# Patient Record
Sex: Male | Born: 1940 | Race: White | Hispanic: No | Marital: Married | Smoking: Former smoker
Health system: Southern US, Community
[De-identification: ages and names within clinical notes are randomized; demographics above are authoritative.]

## PROBLEM LIST (undated history)

## (undated) DIAGNOSIS — N429 Disorder of prostate, unspecified: Secondary | ICD-10-CM

## (undated) DIAGNOSIS — F32A Depression, unspecified: Secondary | ICD-10-CM

## (undated) DIAGNOSIS — N200 Calculus of kidney: Secondary | ICD-10-CM

## (undated) DIAGNOSIS — R609 Edema, unspecified: Secondary | ICD-10-CM

## (undated) DIAGNOSIS — M1990 Unspecified osteoarthritis, unspecified site: Secondary | ICD-10-CM

## (undated) DIAGNOSIS — G4730 Sleep apnea, unspecified: Secondary | ICD-10-CM

## (undated) DIAGNOSIS — C801 Malignant (primary) neoplasm, unspecified: Secondary | ICD-10-CM

## (undated) DIAGNOSIS — K219 Gastro-esophageal reflux disease without esophagitis: Secondary | ICD-10-CM

## (undated) DIAGNOSIS — R410 Disorientation, unspecified: Secondary | ICD-10-CM

## (undated) DIAGNOSIS — I341 Nonrheumatic mitral (valve) prolapse: Secondary | ICD-10-CM

## (undated) DIAGNOSIS — I10 Essential (primary) hypertension: Secondary | ICD-10-CM

## (undated) DIAGNOSIS — I499 Cardiac arrhythmia, unspecified: Secondary | ICD-10-CM

## (undated) DIAGNOSIS — H9190 Unspecified hearing loss, unspecified ear: Secondary | ICD-10-CM

## (undated) DIAGNOSIS — F329 Major depressive disorder, single episode, unspecified: Secondary | ICD-10-CM

## (undated) HISTORY — PX: CYSTOSCOPY: SHX5120

## (undated) HISTORY — PX: CEREBRAL ANEURYSM REPAIR: SHX164

## (undated) HISTORY — PX: BLADDER SURGERY: SHX569

## (undated) HISTORY — PX: WRIST SURGERY: SHX841

## (undated) HISTORY — PX: EYE SURGERY: SHX253

## (undated) HISTORY — PX: BACK SURGERY: SHX140

## (undated) HISTORY — PX: TONSILLECTOMY: SUR1361

## (undated) HISTORY — PX: KNEE ARTHROSCOPY: SUR90

---

## 1999-02-01 HISTORY — PX: CEREBRAL ANEURYSM REPAIR: SHX164

## 1999-02-01 HISTORY — PX: VENTRICULOPERITONEAL SHUNT: SHX204

## 1999-09-28 ENCOUNTER — Encounter: Payer: Self-pay | Admitting: Neurosurgery

## 1999-09-28 ENCOUNTER — Encounter: Payer: Self-pay | Admitting: Pulmonary Disease

## 1999-09-28 ENCOUNTER — Inpatient Hospital Stay (HOSPITAL_COMMUNITY): Admission: EM | Admit: 1999-09-28 | Discharge: 1999-10-25 | Payer: Self-pay | Admitting: Emergency Medicine

## 1999-09-30 ENCOUNTER — Encounter: Payer: Self-pay | Admitting: Neurosurgery

## 1999-10-02 ENCOUNTER — Encounter: Payer: Self-pay | Admitting: Neurosurgery

## 1999-10-03 ENCOUNTER — Encounter: Payer: Self-pay | Admitting: Neurosurgery

## 1999-10-03 ENCOUNTER — Encounter: Payer: Self-pay | Admitting: Pulmonary Disease

## 1999-10-04 ENCOUNTER — Encounter: Payer: Self-pay | Admitting: Pulmonary Disease

## 1999-10-05 ENCOUNTER — Encounter: Payer: Self-pay | Admitting: Neurosurgery

## 1999-10-07 ENCOUNTER — Encounter: Payer: Self-pay | Admitting: Neurosurgery

## 1999-10-07 ENCOUNTER — Encounter: Payer: Self-pay | Admitting: Pulmonary Disease

## 1999-10-08 ENCOUNTER — Encounter: Payer: Self-pay | Admitting: Neurosurgery

## 1999-10-12 ENCOUNTER — Encounter: Payer: Self-pay | Admitting: Neurosurgery

## 1999-10-12 ENCOUNTER — Encounter: Payer: Self-pay | Admitting: Critical Care Medicine

## 1999-10-15 ENCOUNTER — Encounter: Payer: Self-pay | Admitting: Neurosurgery

## 1999-10-18 ENCOUNTER — Encounter: Payer: Self-pay | Admitting: Neurosurgery

## 1999-10-20 ENCOUNTER — Encounter: Payer: Self-pay | Admitting: Neurosurgery

## 1999-10-25 ENCOUNTER — Inpatient Hospital Stay (HOSPITAL_COMMUNITY)
Admission: RE | Admit: 1999-10-25 | Discharge: 1999-10-29 | Payer: Self-pay | Admitting: Physical Medicine & Rehabilitation

## 1999-11-12 ENCOUNTER — Ambulatory Visit (HOSPITAL_COMMUNITY): Admission: RE | Admit: 1999-11-12 | Discharge: 1999-11-12 | Payer: Self-pay | Admitting: Neurosurgery

## 1999-11-12 ENCOUNTER — Encounter: Payer: Self-pay | Admitting: Neurosurgery

## 2000-09-07 ENCOUNTER — Encounter: Payer: Self-pay | Admitting: Neurosurgery

## 2000-09-07 ENCOUNTER — Ambulatory Visit (HOSPITAL_COMMUNITY): Admission: RE | Admit: 2000-09-07 | Discharge: 2000-09-07 | Payer: Self-pay | Admitting: Neurosurgery

## 2001-11-09 ENCOUNTER — Ambulatory Visit (HOSPITAL_COMMUNITY): Admission: RE | Admit: 2001-11-09 | Discharge: 2001-11-09 | Payer: Self-pay | Admitting: Interventional Radiology

## 2003-11-07 ENCOUNTER — Ambulatory Visit: Payer: Self-pay | Admitting: Gastroenterology

## 2004-11-15 ENCOUNTER — Ambulatory Visit: Payer: Self-pay | Admitting: Internal Medicine

## 2004-12-08 ENCOUNTER — Ambulatory Visit: Payer: Self-pay | Admitting: Urology

## 2005-01-27 ENCOUNTER — Ambulatory Visit: Payer: Self-pay

## 2005-03-30 ENCOUNTER — Ambulatory Visit: Payer: Self-pay | Admitting: Urology

## 2005-10-18 ENCOUNTER — Encounter: Payer: Self-pay | Admitting: Internal Medicine

## 2005-10-26 ENCOUNTER — Ambulatory Visit: Payer: Self-pay | Admitting: Urology

## 2006-04-26 ENCOUNTER — Ambulatory Visit: Payer: Self-pay | Admitting: Urology

## 2006-11-19 ENCOUNTER — Other Ambulatory Visit: Payer: Self-pay

## 2006-11-19 ENCOUNTER — Emergency Department: Payer: Self-pay | Admitting: Emergency Medicine

## 2006-11-20 ENCOUNTER — Ambulatory Visit: Payer: Self-pay | Admitting: Urology

## 2006-12-12 ENCOUNTER — Ambulatory Visit: Payer: Self-pay | Admitting: Urology

## 2006-12-16 ENCOUNTER — Ambulatory Visit: Payer: Self-pay | Admitting: Gastroenterology

## 2007-01-17 ENCOUNTER — Ambulatory Visit: Payer: Self-pay | Admitting: Urology

## 2007-03-14 ENCOUNTER — Ambulatory Visit: Payer: Self-pay | Admitting: Urology

## 2007-03-21 ENCOUNTER — Ambulatory Visit: Payer: Self-pay | Admitting: Unknown Physician Specialty

## 2007-03-21 ENCOUNTER — Other Ambulatory Visit: Payer: Self-pay

## 2007-04-03 ENCOUNTER — Ambulatory Visit: Payer: Self-pay | Admitting: Urology

## 2007-04-09 ENCOUNTER — Ambulatory Visit: Payer: Self-pay | Admitting: Unknown Physician Specialty

## 2007-08-31 ENCOUNTER — Ambulatory Visit: Payer: Self-pay | Admitting: Urology

## 2007-09-04 ENCOUNTER — Ambulatory Visit: Payer: Self-pay | Admitting: Urology

## 2008-04-14 ENCOUNTER — Ambulatory Visit: Payer: Self-pay | Admitting: Unknown Physician Specialty

## 2008-06-23 ENCOUNTER — Ambulatory Visit: Payer: Self-pay | Admitting: Urology

## 2008-06-24 ENCOUNTER — Ambulatory Visit: Payer: Self-pay | Admitting: Urology

## 2008-08-28 ENCOUNTER — Ambulatory Visit: Payer: Self-pay | Admitting: Unknown Physician Specialty

## 2008-08-29 ENCOUNTER — Ambulatory Visit: Payer: Self-pay | Admitting: Unknown Physician Specialty

## 2008-12-10 ENCOUNTER — Ambulatory Visit: Payer: Self-pay | Admitting: Ophthalmology

## 2008-12-13 IMAGING — CR DG ABDOMEN 1V
1 series · 2 of 2 positions shown · non-contrast
Comparison: none

REASON FOR EXAM: nephrolithiasis, pt need films
COMMENTS:

[Series 1: view not recorded · 0.17mm/px · 2 of 2 slices shown]
[im 1/2]
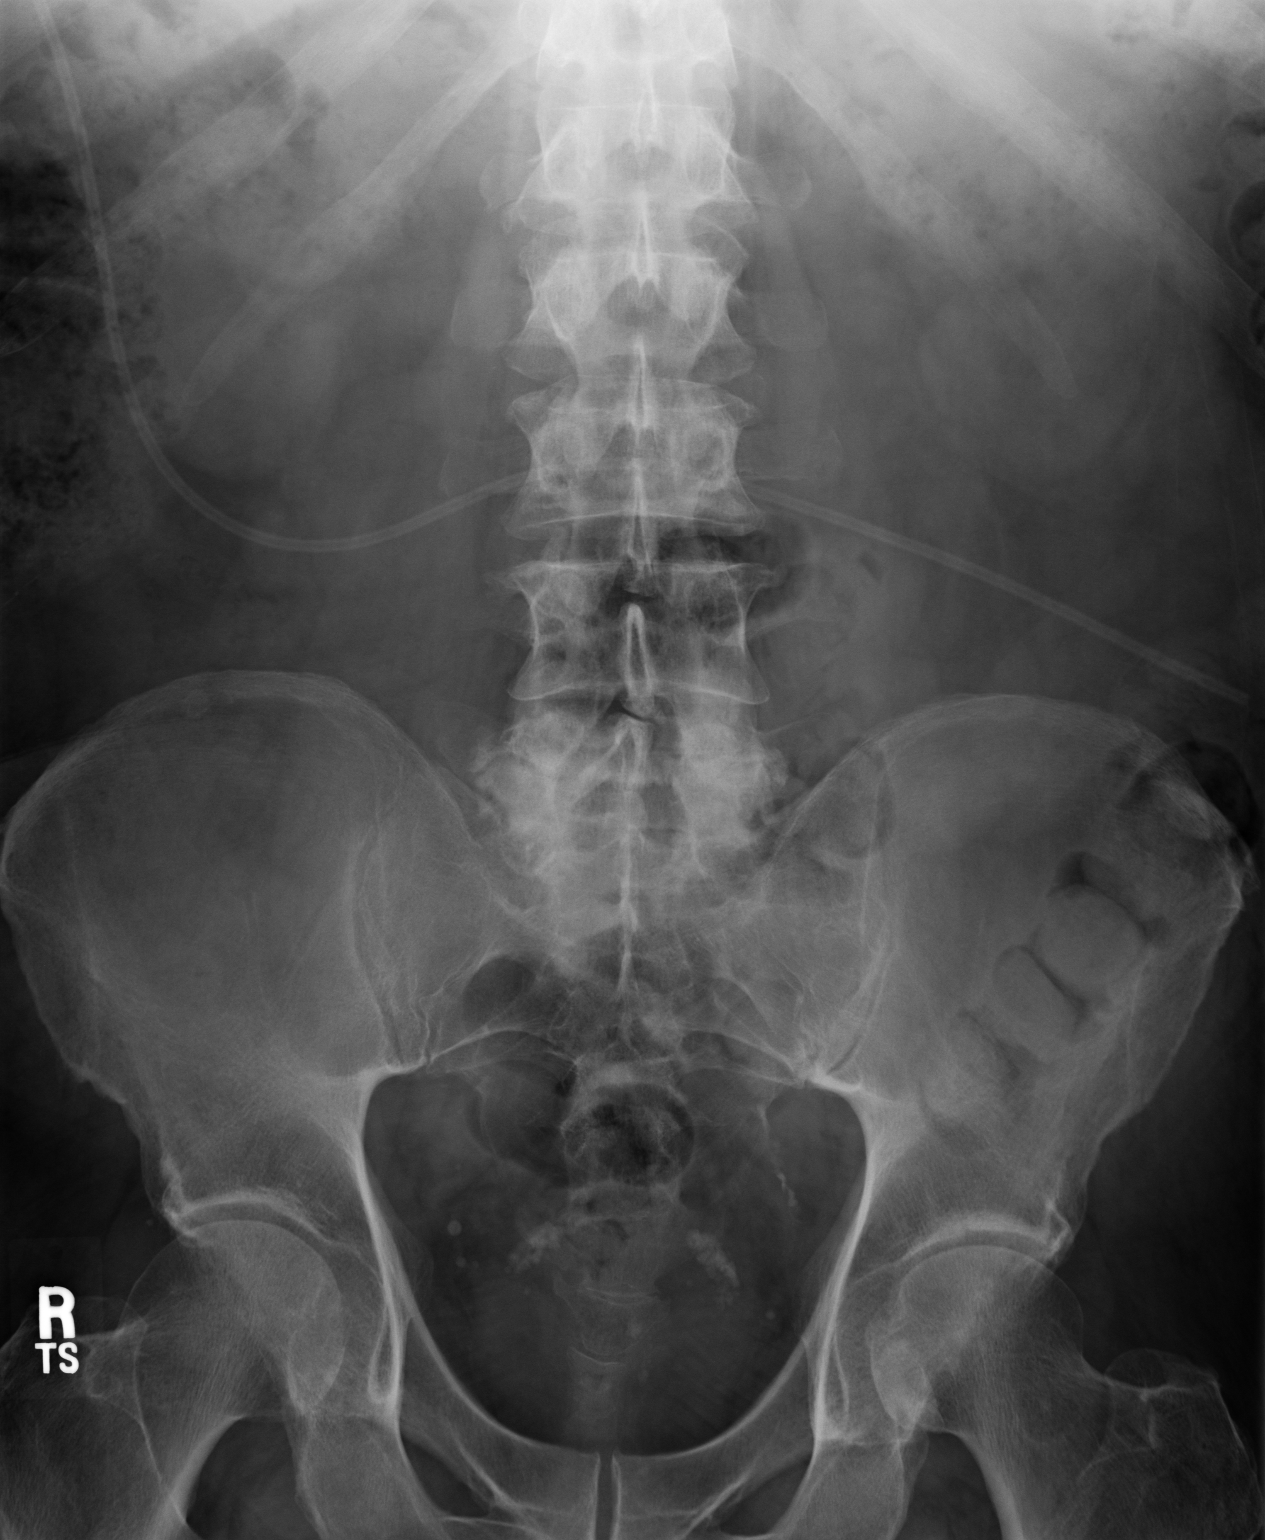
[im 2/2]
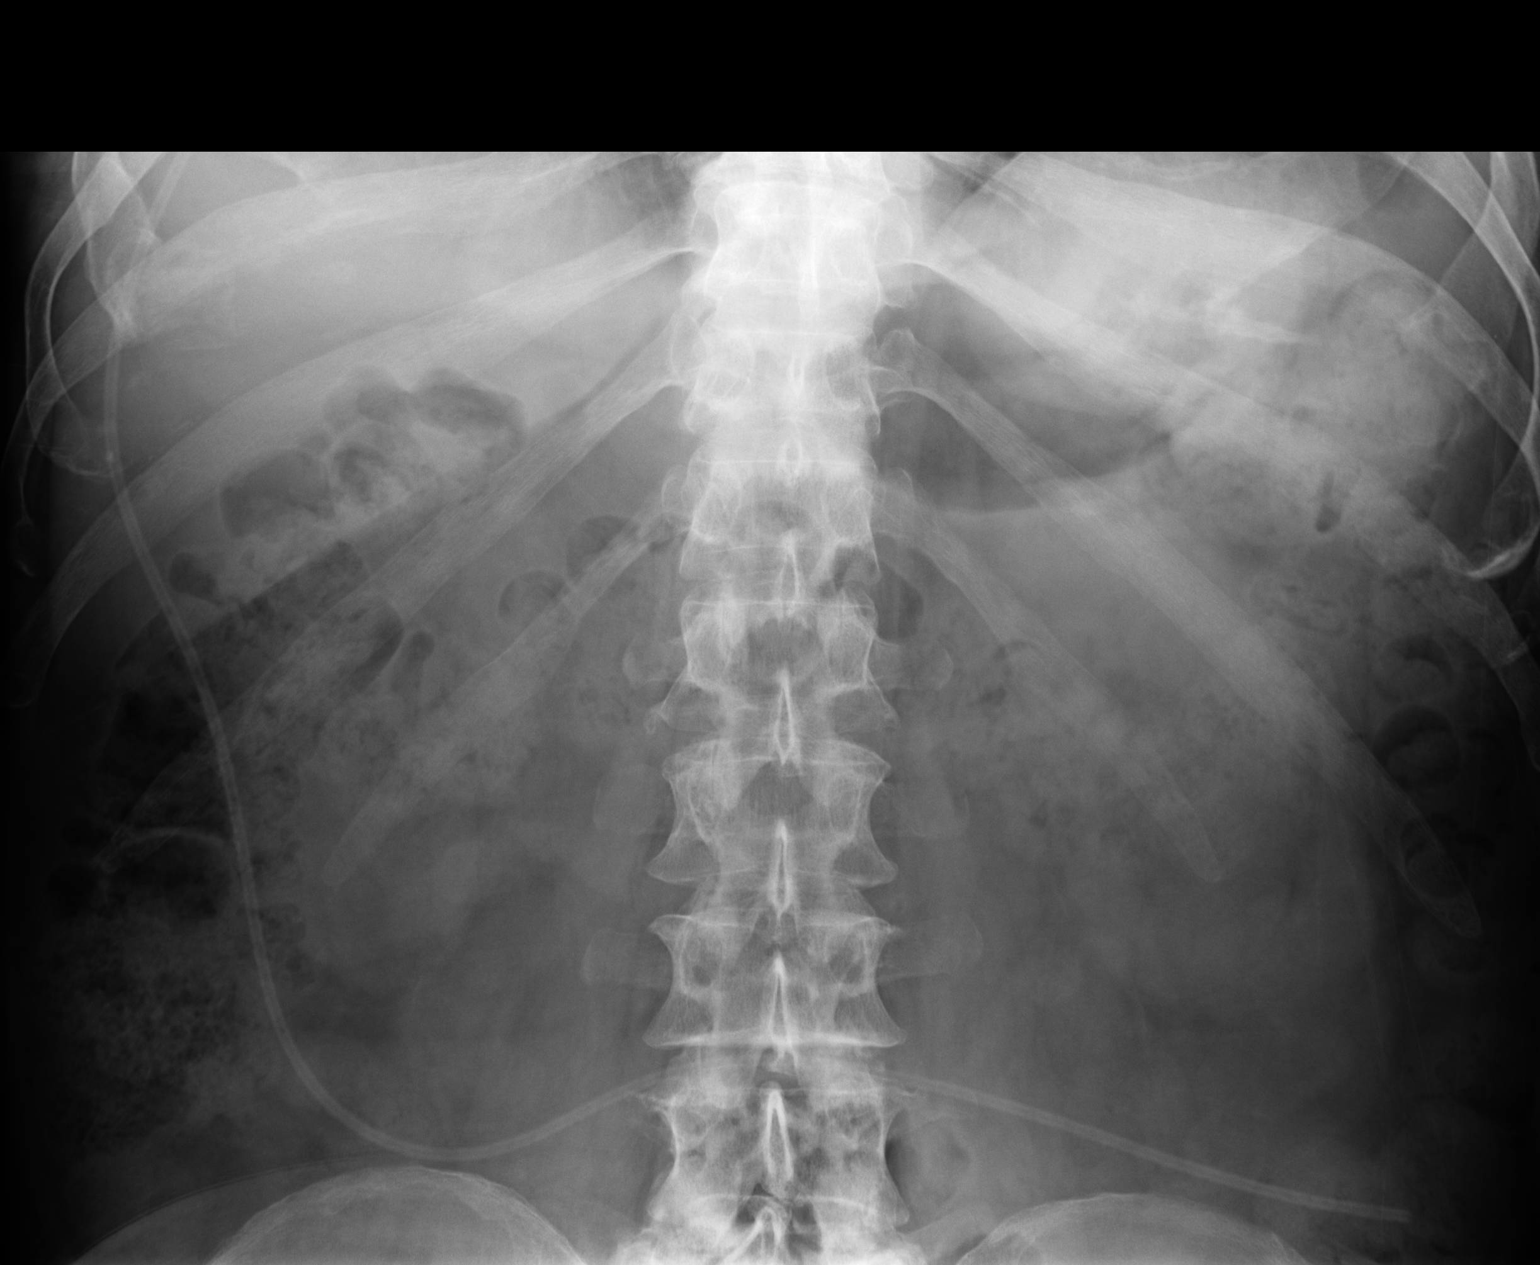

[2 of 2 positions shown; findings below may reference images not displayed]

PROCEDURE:     DXR - DXR KIDNEY URETER BLADDER  - August 31, 2007  [DATE]

RESULT:     Comparison is made to the prior exam of 01/17/2007. No definite
renal calcifications are identified on plain film examination. There are
noted two calcifications projected at the LEFT pelvic area and which could
represent stones in the urinary bladder. The more superiorly located
measures 4 mm in diameter and is projected near the medial aspect of LEFT
ureterovesical junction and could either be in the distal ureter or bladder.
Alternatively, these could represent opaque material in the rectum and be
unrelated to the urinary tract.  A catheter is present over the abdomen and
presumably represents the distal portion of a ventriculoperitoneal shunt.
There is a moderate amount of fecal material in the colon.
IMPRESSION: Please see above.

## 2008-12-22 ENCOUNTER — Ambulatory Visit: Payer: Self-pay | Admitting: Ophthalmology

## 2009-01-08 ENCOUNTER — Ambulatory Visit: Payer: Self-pay | Admitting: Urology

## 2009-01-15 ENCOUNTER — Ambulatory Visit: Payer: Self-pay | Admitting: Urology

## 2009-01-20 ENCOUNTER — Ambulatory Visit: Payer: Self-pay | Admitting: Urology

## 2009-08-26 ENCOUNTER — Ambulatory Visit: Payer: Self-pay | Admitting: Urology

## 2009-10-26 ENCOUNTER — Ambulatory Visit: Payer: Self-pay | Admitting: Internal Medicine

## 2009-12-04 ENCOUNTER — Ambulatory Visit: Payer: Self-pay | Admitting: Unknown Physician Specialty

## 2009-12-08 LAB — PATHOLOGY REPORT

## 2009-12-22 ENCOUNTER — Ambulatory Visit: Payer: Self-pay | Admitting: Urology

## 2009-12-23 ENCOUNTER — Ambulatory Visit: Payer: Self-pay | Admitting: Cardiology

## 2010-01-05 ENCOUNTER — Ambulatory Visit: Payer: Self-pay | Admitting: Specialist

## 2010-01-13 ENCOUNTER — Inpatient Hospital Stay: Payer: Self-pay | Admitting: Specialist

## 2010-02-02 ENCOUNTER — Encounter: Payer: Self-pay | Admitting: Specialist

## 2010-02-20 ENCOUNTER — Encounter: Payer: Self-pay | Admitting: Interventional Radiology

## 2010-04-06 ENCOUNTER — Ambulatory Visit: Payer: Self-pay | Admitting: Urology

## 2010-04-20 ENCOUNTER — Ambulatory Visit: Payer: Self-pay | Admitting: Urology

## 2010-04-23 IMAGING — CR DG ABDOMEN 1V
1 series · 2 of 2 positions shown · non-contrast
Comparison: none

REASON FOR EXAM: nephrolithiasis pt need films
COMMENTS:

[Series 1: view not recorded · 0.17mm/px · 2 of 2 slices shown]
[im 1/2]
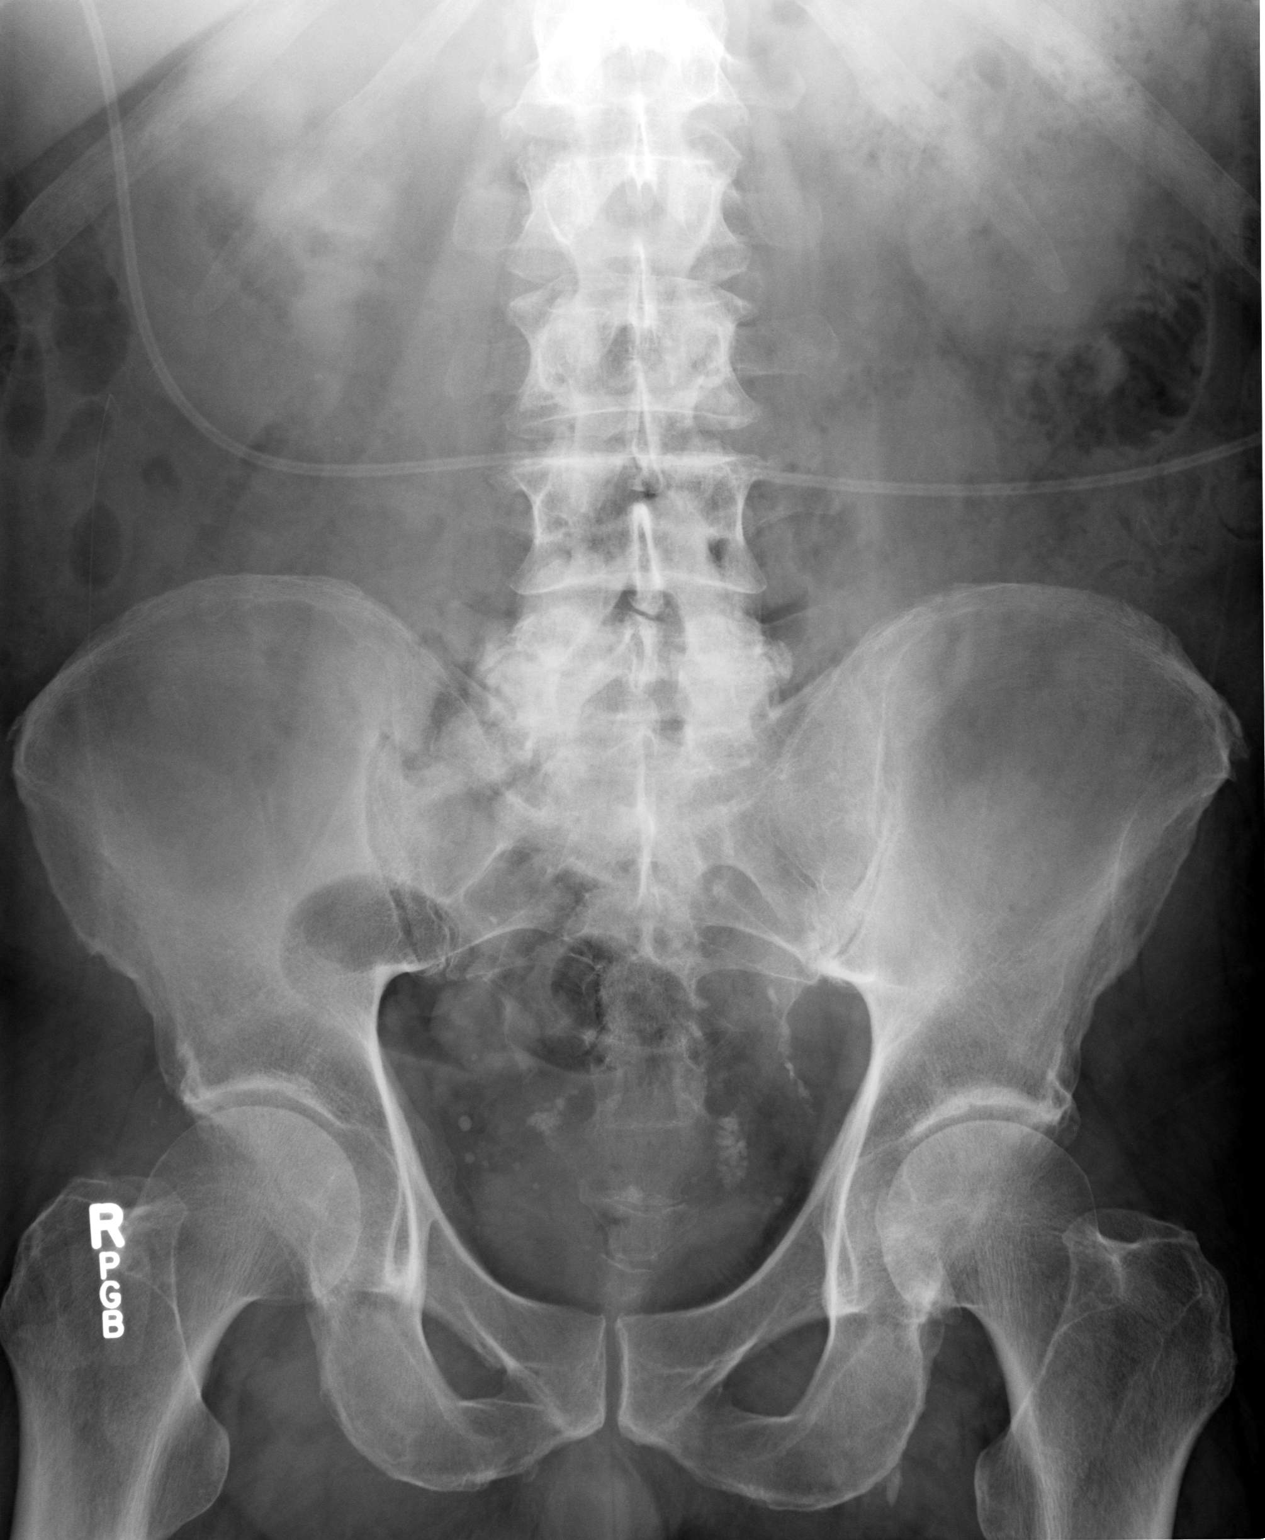
[im 2/2]
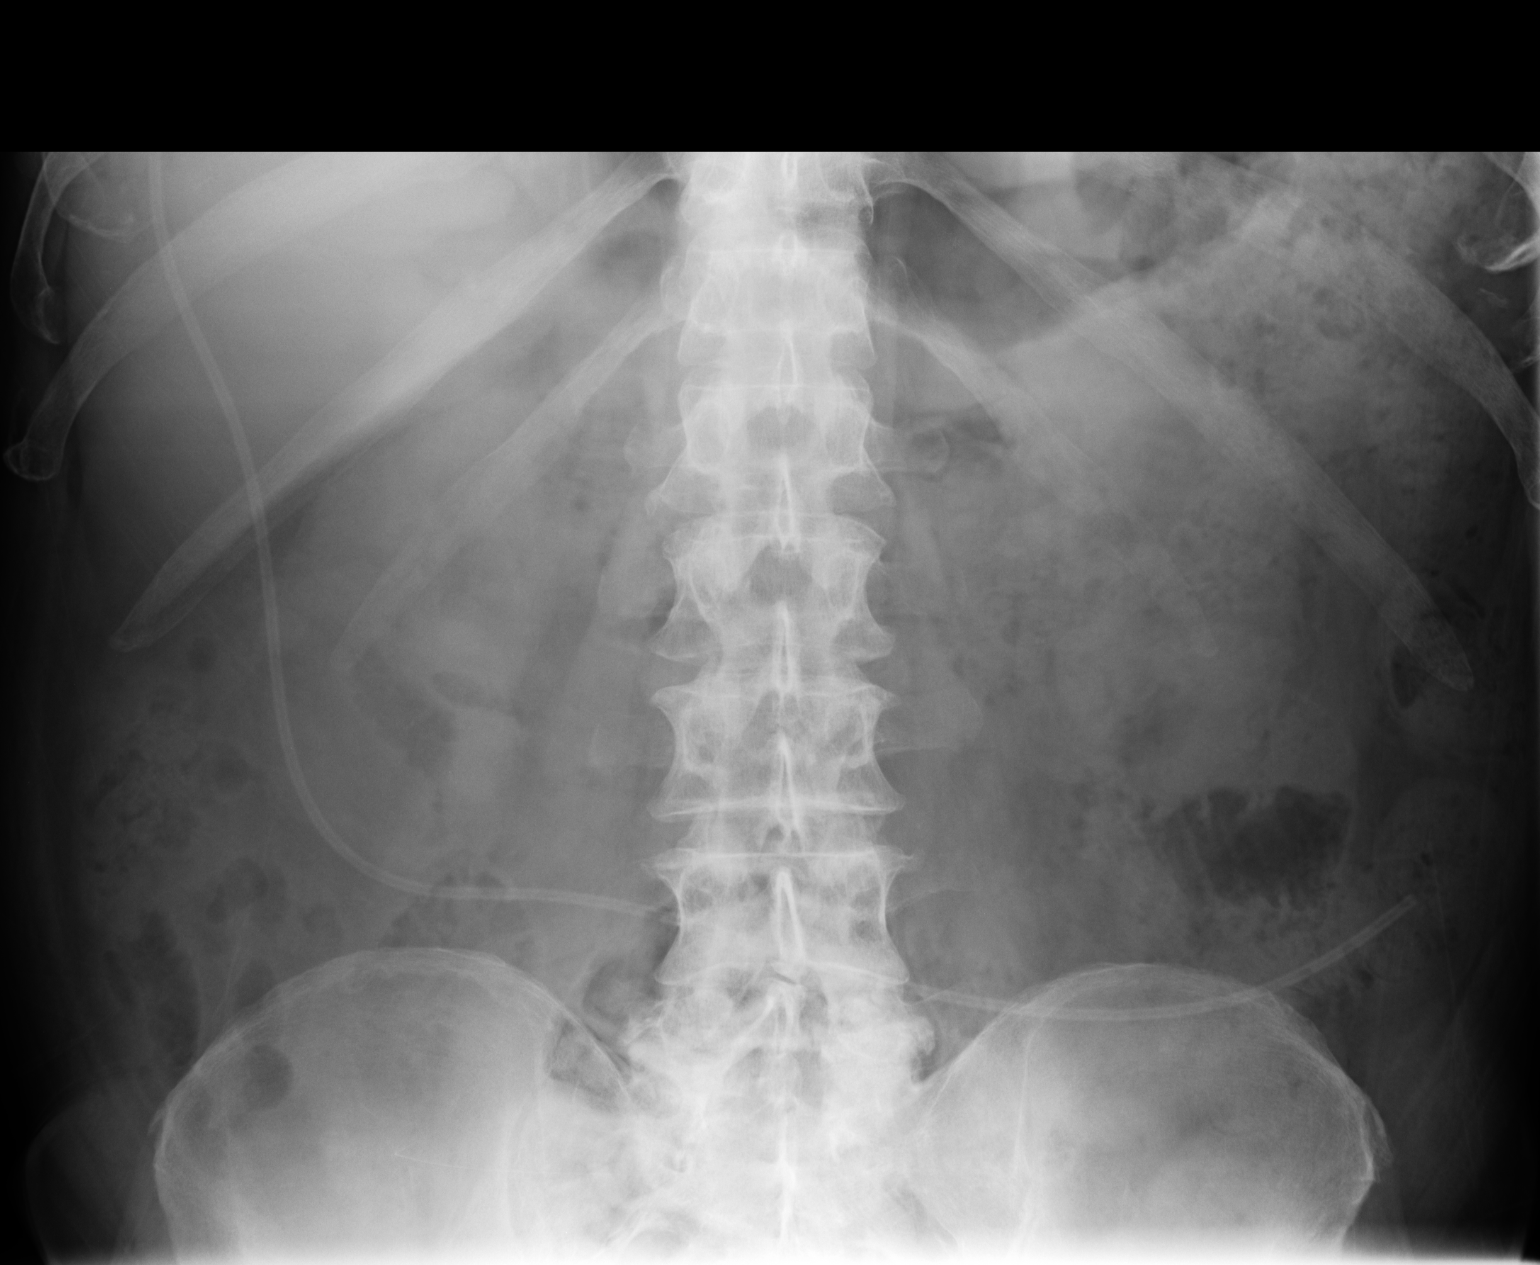

[2 of 2 positions shown; findings below may reference images not displayed]

PROCEDURE:     DXR - DXR KIDNEY URETER BLADDER  - January 08, 2009 [DATE]

RESULT:     Comparison is made to an examination of 06/23/2008 as well is to
a study of 08/31/2007. Multiple calcifications are seen over the pelvic
region including vascular calcifications. Degenerative changes are seen in
the lower lumbar spine. The bowel gas pattern is unremarkable. No definite
nephrolithiasis is evident. No abnormal bowel distention is seen. A tube
projects over the abdomen from the right upper quadrant extending inferiorly
and then transversely at the level of the iliac crest terminating in the
left midabdomen suggestive of a ventriculoperitoneal tube.
IMPRESSION: No definite nephrolithiasis. Ureteral stones would be difficult to exclude.
CT followup is available if indicated.

## 2010-05-04 ENCOUNTER — Other Ambulatory Visit: Payer: Self-pay | Admitting: Urology

## 2010-08-27 ENCOUNTER — Other Ambulatory Visit: Payer: Self-pay | Admitting: Urology

## 2010-08-28 LAB — PSA: PSA: 2.9 ng/mL (ref 0.0–4.0)

## 2010-12-09 IMAGING — CR DG ABDOMEN 1V
1 series · 2 of 2 positions shown · non-contrast
Comparison: none

REASON FOR EXAM: nephroithiasis pt need films
COMMENTS:

[Series 1: view not recorded · 0.17mm/px · 2 of 2 slices shown]
[im 1/2]
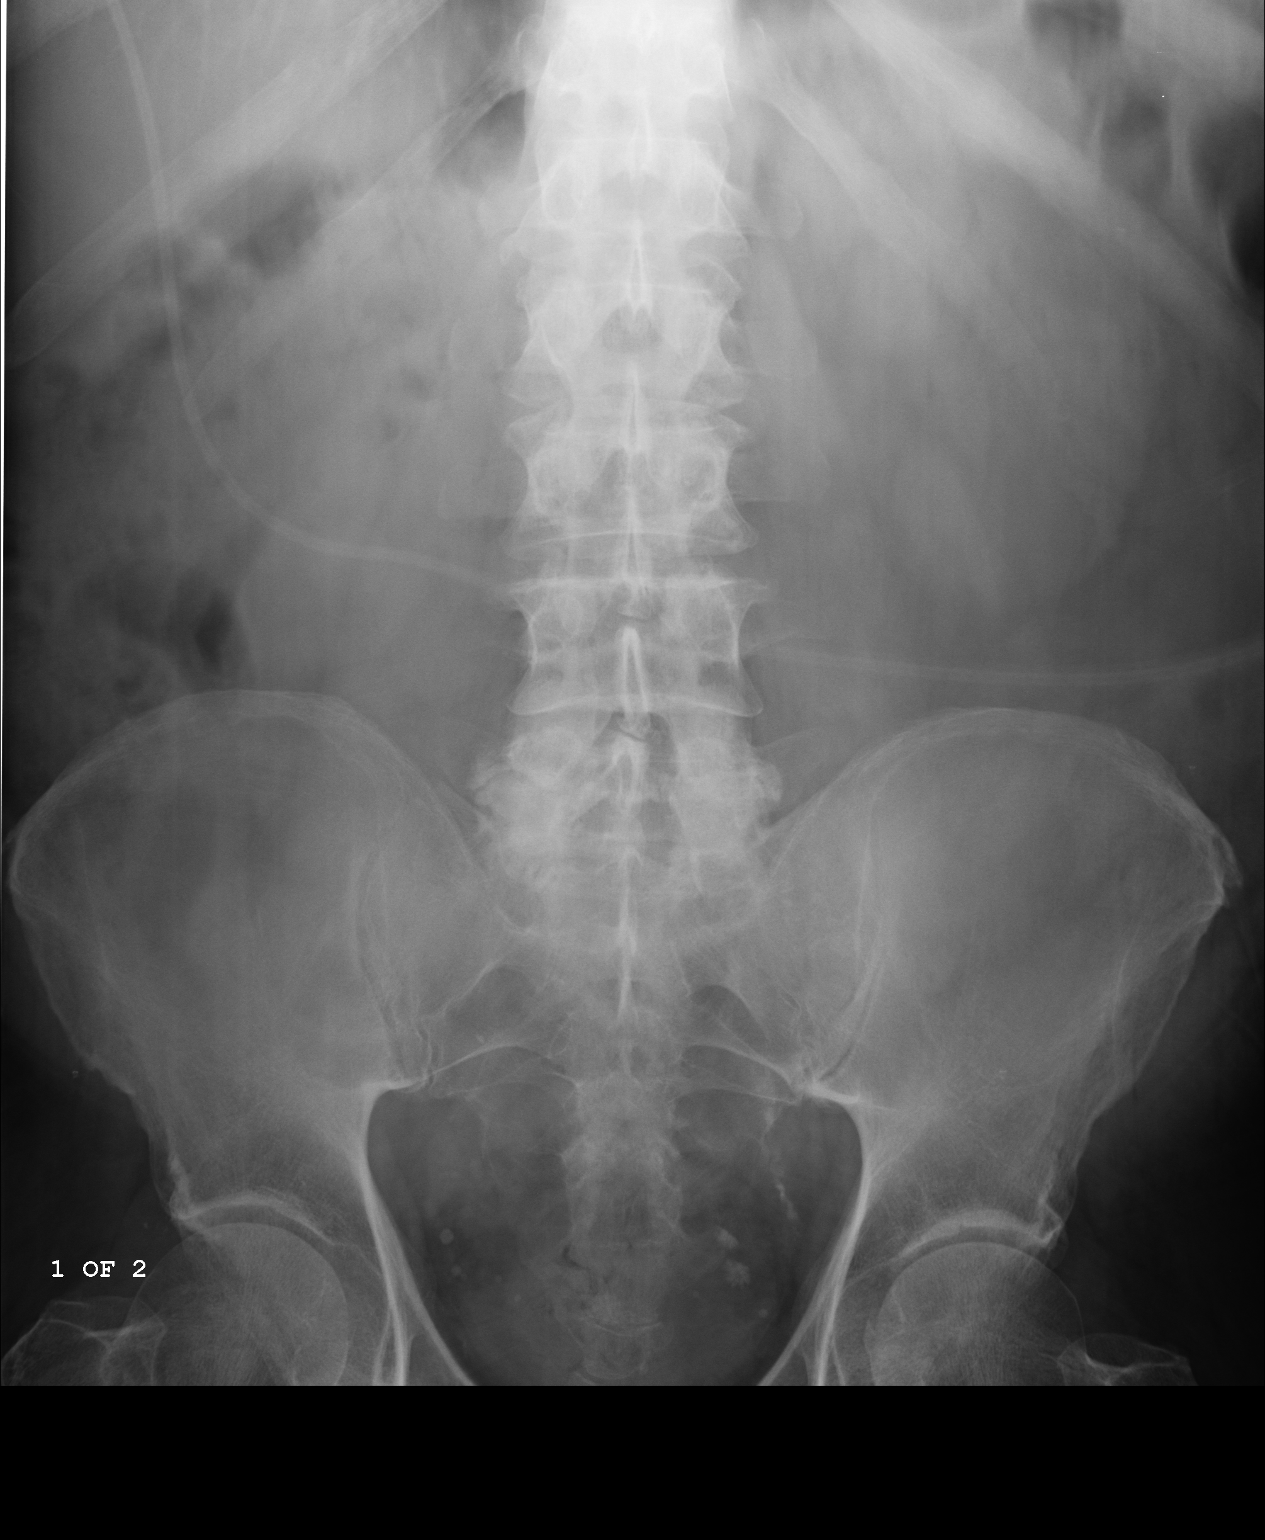
[im 2/2]
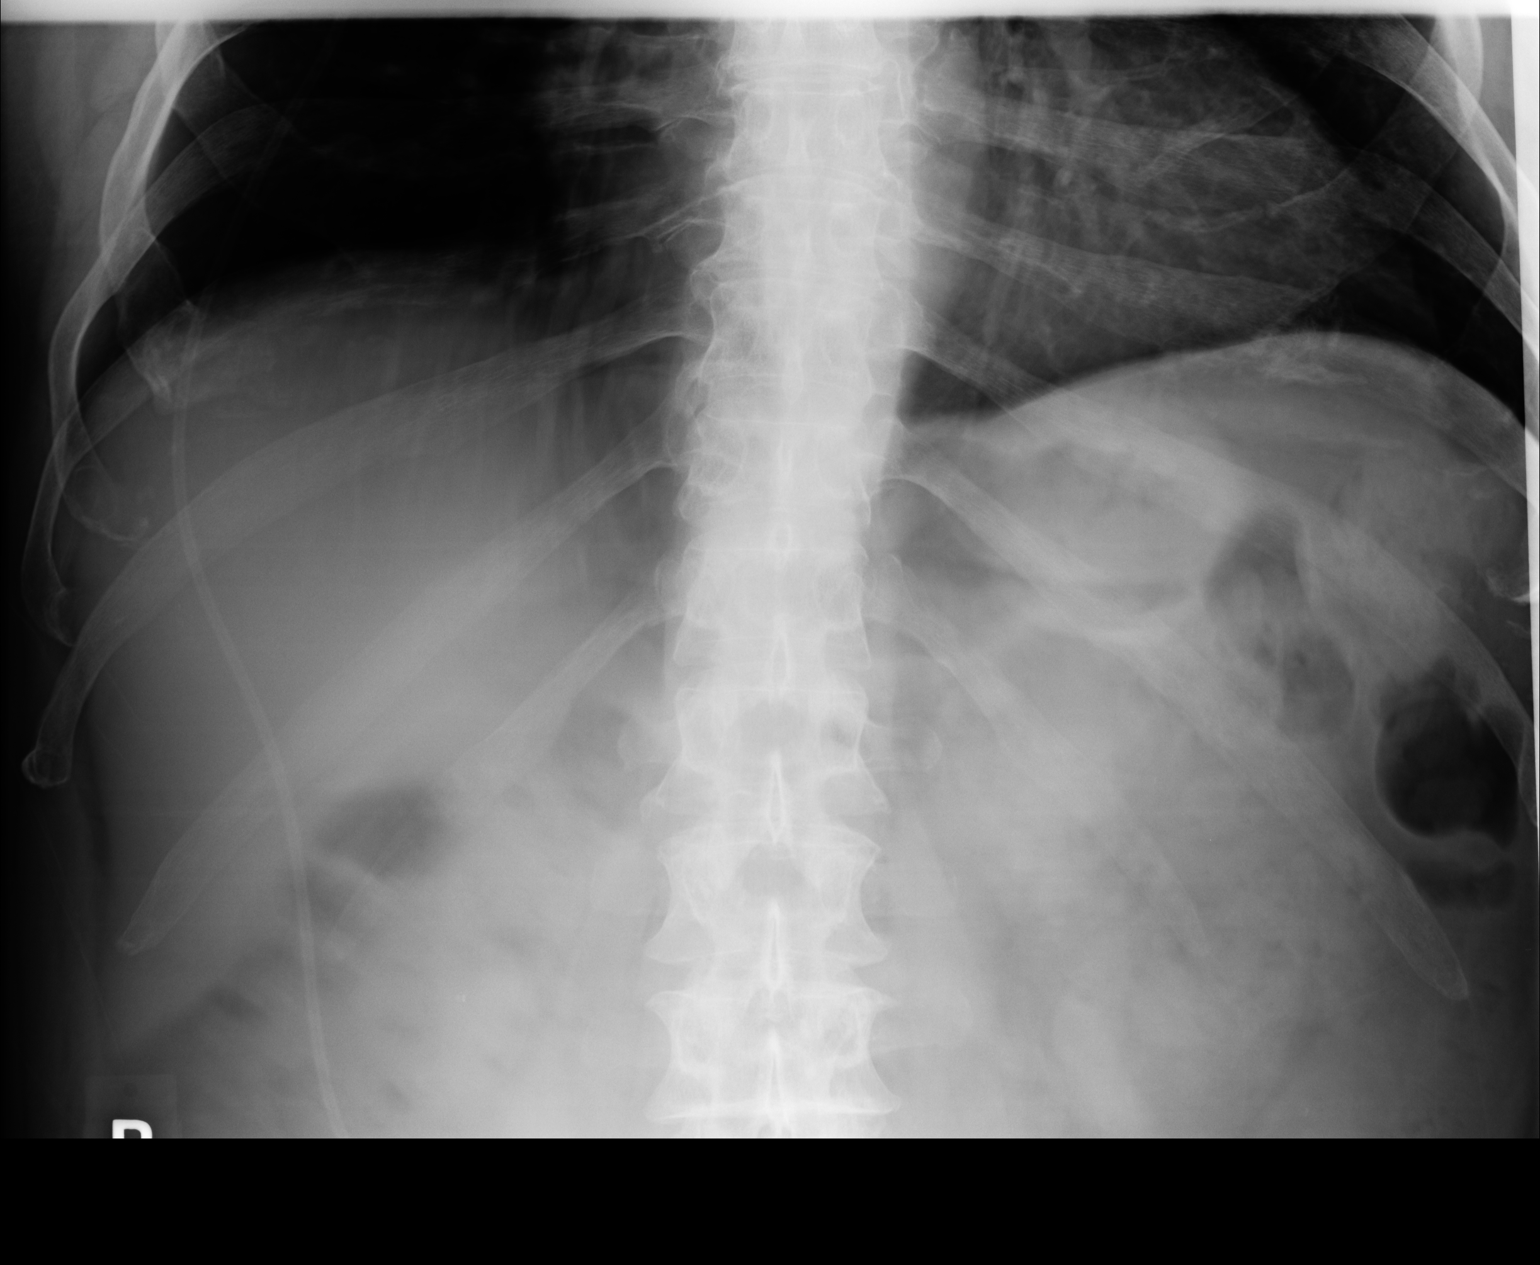

[2 of 2 positions shown; findings below may reference images not displayed]

PROCEDURE:     DXR - DXR KIDNEY URETER BLADDER  - August 26, 2009  [DATE]

RESULT:     Comparison is made to the study of 01/08/2009.

There is respiratory motion artifact obscuring portions of the upper
abdomen. There is a piece of tubing projecting over the right abdomen and
transversely across the mid abdomen with the tip demonstrated in the left
abdomen suggestive of a ventriculoperitoneal shunt tube. No definite renal
calculi are evident. Numerous pelvic calcifications suggest phleboliths and
atherosclerotic disease.
IMPRESSION: No nephrolithiasis or urinary tract stones evident. Degenerative bony
changes, atherosclerotic calcification and phleboliths are present. There
appears to be a ventriculoperitoneal shunt tube likely present.

## 2011-01-20 ENCOUNTER — Ambulatory Visit: Payer: Self-pay | Admitting: Cardiology

## 2011-03-30 ENCOUNTER — Ambulatory Visit: Payer: Self-pay | Admitting: Urology

## 2012-09-12 ENCOUNTER — Ambulatory Visit: Payer: Self-pay | Admitting: Urology

## 2013-03-15 ENCOUNTER — Ambulatory Visit: Payer: Self-pay | Admitting: Unknown Physician Specialty

## 2014-02-20 ENCOUNTER — Ambulatory Visit: Payer: Self-pay | Admitting: Internal Medicine

## 2014-04-28 ENCOUNTER — Ambulatory Visit: Payer: Self-pay | Admitting: Unknown Physician Specialty

## 2014-05-26 LAB — SURGICAL PATHOLOGY

## 2014-09-02 ENCOUNTER — Encounter: Payer: Self-pay | Admitting: *Deleted

## 2014-09-02 DIAGNOSIS — Z982 Presence of cerebrospinal fluid drainage device: Secondary | ICD-10-CM | POA: Diagnosis not present

## 2014-09-02 DIAGNOSIS — Z974 Presence of external hearing-aid: Secondary | ICD-10-CM | POA: Diagnosis not present

## 2014-09-02 DIAGNOSIS — I341 Nonrheumatic mitral (valve) prolapse: Secondary | ICD-10-CM | POA: Diagnosis not present

## 2014-09-02 DIAGNOSIS — Z8679 Personal history of other diseases of the circulatory system: Secondary | ICD-10-CM | POA: Diagnosis not present

## 2014-09-02 DIAGNOSIS — M1990 Unspecified osteoarthritis, unspecified site: Secondary | ICD-10-CM | POA: Diagnosis not present

## 2014-09-02 DIAGNOSIS — Z7982 Long term (current) use of aspirin: Secondary | ICD-10-CM | POA: Diagnosis not present

## 2014-09-02 DIAGNOSIS — H2511 Age-related nuclear cataract, right eye: Secondary | ICD-10-CM | POA: Diagnosis not present

## 2014-09-02 DIAGNOSIS — K219 Gastro-esophageal reflux disease without esophagitis: Secondary | ICD-10-CM | POA: Diagnosis not present

## 2014-09-02 DIAGNOSIS — Z87442 Personal history of urinary calculi: Secondary | ICD-10-CM | POA: Diagnosis not present

## 2014-09-02 DIAGNOSIS — Z87891 Personal history of nicotine dependence: Secondary | ICD-10-CM | POA: Diagnosis not present

## 2014-09-02 DIAGNOSIS — H269 Unspecified cataract: Secondary | ICD-10-CM | POA: Diagnosis present

## 2014-09-02 DIAGNOSIS — E780 Pure hypercholesterolemia: Secondary | ICD-10-CM | POA: Diagnosis not present

## 2014-09-02 DIAGNOSIS — F329 Major depressive disorder, single episode, unspecified: Secondary | ICD-10-CM | POA: Diagnosis not present

## 2014-09-02 DIAGNOSIS — I499 Cardiac arrhythmia, unspecified: Secondary | ICD-10-CM | POA: Diagnosis not present

## 2014-09-02 DIAGNOSIS — I10 Essential (primary) hypertension: Secondary | ICD-10-CM | POA: Diagnosis not present

## 2014-09-02 DIAGNOSIS — Z79899 Other long term (current) drug therapy: Secondary | ICD-10-CM | POA: Diagnosis not present

## 2014-09-02 DIAGNOSIS — Z8551 Personal history of malignant neoplasm of bladder: Secondary | ICD-10-CM | POA: Diagnosis not present

## 2014-09-02 DIAGNOSIS — G4730 Sleep apnea, unspecified: Secondary | ICD-10-CM | POA: Diagnosis not present

## 2014-09-02 DIAGNOSIS — H9190 Unspecified hearing loss, unspecified ear: Secondary | ICD-10-CM | POA: Diagnosis not present

## 2014-09-08 ENCOUNTER — Ambulatory Visit: Payer: PPO | Admitting: Anesthesiology

## 2014-09-08 ENCOUNTER — Encounter
Admission: RE | Disposition: A | Discharge status: Home / Self Care | Payer: Self-pay | Source: Ambulatory Visit | Attending: Ophthalmology

## 2014-09-08 ENCOUNTER — Encounter: Payer: Self-pay | Admitting: *Deleted

## 2014-09-08 ENCOUNTER — Ambulatory Visit
Admission: RE | Admit: 2014-09-08 | Discharge: 2014-09-08 | Disposition: A | Discharge status: Home / Self Care | Payer: PPO | Source: Ambulatory Visit | Attending: Ophthalmology | Admitting: Ophthalmology

## 2014-09-08 DIAGNOSIS — Z87442 Personal history of urinary calculi: Secondary | ICD-10-CM | POA: Insufficient documentation

## 2014-09-08 DIAGNOSIS — Z982 Presence of cerebrospinal fluid drainage device: Secondary | ICD-10-CM | POA: Insufficient documentation

## 2014-09-08 DIAGNOSIS — Z7982 Long term (current) use of aspirin: Secondary | ICD-10-CM | POA: Insufficient documentation

## 2014-09-08 DIAGNOSIS — F329 Major depressive disorder, single episode, unspecified: Secondary | ICD-10-CM | POA: Insufficient documentation

## 2014-09-08 DIAGNOSIS — Z8679 Personal history of other diseases of the circulatory system: Secondary | ICD-10-CM | POA: Insufficient documentation

## 2014-09-08 DIAGNOSIS — Z79899 Other long term (current) drug therapy: Secondary | ICD-10-CM | POA: Insufficient documentation

## 2014-09-08 DIAGNOSIS — Z8551 Personal history of malignant neoplasm of bladder: Secondary | ICD-10-CM | POA: Insufficient documentation

## 2014-09-08 DIAGNOSIS — Z87891 Personal history of nicotine dependence: Secondary | ICD-10-CM | POA: Insufficient documentation

## 2014-09-08 DIAGNOSIS — I341 Nonrheumatic mitral (valve) prolapse: Secondary | ICD-10-CM | POA: Insufficient documentation

## 2014-09-08 DIAGNOSIS — H2511 Age-related nuclear cataract, right eye: Secondary | ICD-10-CM | POA: Insufficient documentation

## 2014-09-08 DIAGNOSIS — H9190 Unspecified hearing loss, unspecified ear: Secondary | ICD-10-CM | POA: Insufficient documentation

## 2014-09-08 DIAGNOSIS — I10 Essential (primary) hypertension: Secondary | ICD-10-CM | POA: Insufficient documentation

## 2014-09-08 DIAGNOSIS — M1990 Unspecified osteoarthritis, unspecified site: Secondary | ICD-10-CM | POA: Insufficient documentation

## 2014-09-08 DIAGNOSIS — I499 Cardiac arrhythmia, unspecified: Secondary | ICD-10-CM | POA: Insufficient documentation

## 2014-09-08 DIAGNOSIS — Z974 Presence of external hearing-aid: Secondary | ICD-10-CM | POA: Insufficient documentation

## 2014-09-08 DIAGNOSIS — G4730 Sleep apnea, unspecified: Secondary | ICD-10-CM | POA: Insufficient documentation

## 2014-09-08 DIAGNOSIS — K219 Gastro-esophageal reflux disease without esophagitis: Secondary | ICD-10-CM | POA: Insufficient documentation

## 2014-09-08 DIAGNOSIS — E780 Pure hypercholesterolemia: Secondary | ICD-10-CM | POA: Insufficient documentation

## 2014-09-08 HISTORY — DX: Depression, unspecified: F32.A

## 2014-09-08 HISTORY — PX: CATARACT EXTRACTION W/PHACO: SHX586

## 2014-09-08 HISTORY — DX: Nonrheumatic mitral (valve) prolapse: I34.1

## 2014-09-08 HISTORY — DX: Essential (primary) hypertension: I10

## 2014-09-08 HISTORY — DX: Cardiac arrhythmia, unspecified: I49.9

## 2014-09-08 HISTORY — DX: Major depressive disorder, single episode, unspecified: F32.9

## 2014-09-08 HISTORY — DX: Edema, unspecified: R60.9

## 2014-09-08 HISTORY — DX: Unspecified hearing loss, unspecified ear: H91.90

## 2014-09-08 HISTORY — DX: Malignant (primary) neoplasm, unspecified: C80.1

## 2014-09-08 HISTORY — DX: Unspecified osteoarthritis, unspecified site: M19.90

## 2014-09-08 HISTORY — DX: Disorder of prostate, unspecified: N42.9

## 2014-09-08 HISTORY — DX: Disorientation, unspecified: R41.0

## 2014-09-08 HISTORY — DX: Gastro-esophageal reflux disease without esophagitis: K21.9

## 2014-09-08 HISTORY — DX: Sleep apnea, unspecified: G47.30

## 2014-09-08 HISTORY — DX: Calculus of kidney: N20.0

## 2014-09-08 SURGERY — PHACOEMULSIFICATION, CATARACT, WITH IOL INSERTION
Anesthesia: Monitor Anesthesia Care | Site: Eye | Laterality: Right | Wound class: Clean

## 2014-09-08 MED ORDER — PHENYLEPHRINE HCL 10 % OP SOLN
1.0000 [drp] | OPHTHALMIC | Status: AC | PRN
  Administered 2014-09-08 (×4): 1 [drp] via OPHTHALMIC

## 2014-09-08 MED ORDER — TETRACAINE HCL 0.5 % OP SOLN
OPHTHALMIC | Status: AC
  Filled 2014-09-08: qty 2

## 2014-09-08 MED ORDER — EPINEPHRINE HCL 1 MG/ML IJ SOLN
INTRAMUSCULAR | Status: DC | PRN
  Administered 2014-09-08: 200 mL via OPHTHALMIC

## 2014-09-08 MED ORDER — LIDOCAINE HCL (PF) 4 % IJ SOLN
INTRAMUSCULAR | Status: DC | PRN
  Administered 2014-09-08: .5 mL

## 2014-09-08 MED ORDER — CEFUROXIME OPHTHALMIC INJECTION 1 MG/0.1 ML
INJECTION | OPHTHALMIC | Status: AC
  Filled 2014-09-08: qty 0.1

## 2014-09-08 MED ORDER — EPINEPHRINE HCL 1 MG/ML IJ SOLN
INTRAMUSCULAR | Status: AC
  Filled 2014-09-08: qty 2

## 2014-09-08 MED ORDER — MOXIFLOXACIN HCL 0.5 % OP SOLN
1.0000 [drp] | OPHTHALMIC | Status: AC | PRN
  Administered 2014-09-08 (×3): 1 [drp] via OPHTHALMIC

## 2014-09-08 MED ORDER — BUPIVACAINE HCL (PF) 0.75 % IJ SOLN
INTRAMUSCULAR | Status: AC
  Filled 2014-09-08: qty 10

## 2014-09-08 MED ORDER — CYCLOPENTOLATE HCL 2 % OP SOLN
OPHTHALMIC | Status: AC
  Filled 2014-09-08: qty 2

## 2014-09-08 MED ORDER — LIDOCAINE HCL (PF) 4 % IJ SOLN
INTRAMUSCULAR | Status: AC
  Filled 2014-09-08: qty 5

## 2014-09-08 MED ORDER — CEFUROXIME OPHTHALMIC INJECTION 1 MG/0.1 ML
INJECTION | OPHTHALMIC | Status: DC | PRN
  Administered 2014-09-08: .1 mL via INTRACAMERAL

## 2014-09-08 MED ORDER — CARBACHOL 0.01 % IO SOLN
INTRAOCULAR | Status: DC | PRN
  Administered 2014-09-08: .5 mL via INTRAOCULAR

## 2014-09-08 MED ORDER — MIDAZOLAM HCL 2 MG/2ML IJ SOLN
INTRAMUSCULAR | Status: DC | PRN
  Administered 2014-09-08: 1 mg via INTRAVENOUS

## 2014-09-08 MED ORDER — LIDOCAINE HCL (PF) 4 % IJ SOLN
INTRAMUSCULAR | Status: DC | PRN
  Administered 2014-09-08: 7 mL via OPHTHALMIC

## 2014-09-08 MED ORDER — ALFENTANIL 500 MCG/ML IJ INJ
INJECTION | INTRAMUSCULAR | Status: DC | PRN
Start: 2014-09-08 — End: 2014-09-08
  Administered 2014-09-08: 500 ug via INTRAVENOUS

## 2014-09-08 MED ORDER — NA CHONDROIT SULF-NA HYALURON 40-17 MG/ML IO SOLN
INTRAOCULAR | Status: AC
  Filled 2014-09-08: qty 1

## 2014-09-08 MED ORDER — SODIUM CHLORIDE 0.9 % IV SOLN
INTRAVENOUS | Status: DC
  Administered 2014-09-08: 08:00:00 via INTRAVENOUS

## 2014-09-08 MED ORDER — MOXIFLOXACIN HCL 0.5 % OP SOLN
OPHTHALMIC | Status: AC
  Filled 2014-09-08: qty 3

## 2014-09-08 MED ORDER — MOXIFLOXACIN HCL 0.5 % OP SOLN
OPHTHALMIC | Status: DC | PRN
  Administered 2014-09-08: 2 [drp] via OPHTHALMIC

## 2014-09-08 MED ORDER — TETRACAINE HCL 0.5 % OP SOLN
OPHTHALMIC | Status: DC | PRN
  Administered 2014-09-08: 2 [drp] via OPHTHALMIC

## 2014-09-08 MED ORDER — CYCLOPENTOLATE HCL 2 % OP SOLN
1.0000 [drp] | OPHTHALMIC | Status: AC | PRN
  Administered 2014-09-08 (×4): 1 [drp] via OPHTHALMIC

## 2014-09-08 MED ORDER — NA CHONDROIT SULF-NA HYALURON 40-17 MG/ML IO SOLN
INTRAOCULAR | Status: DC | PRN
  Administered 2014-09-08: 1 mL via INTRAOCULAR

## 2014-09-08 MED ORDER — HYALURONIDASE HUMAN 150 UNIT/ML IJ SOLN
INTRAMUSCULAR | Status: AC
  Filled 2014-09-08: qty 1

## 2014-09-08 MED ORDER — PHENYLEPHRINE HCL 10 % OP SOLN
OPHTHALMIC | Status: AC
  Filled 2014-09-08: qty 5

## 2014-09-08 SURGICAL SUPPLY — 33 items
CANNULA ANT/CHMB 27GA (MISCELLANEOUS) ×2 IMPLANT
CNTNR SPEC 2.5X3XGRAD LEK (MISCELLANEOUS) ×1
CONT SPEC 4OZ STER OR WHT (MISCELLANEOUS) ×1
CONTAINER SPEC 2.5X3XGRAD LEK (MISCELLANEOUS) ×1 IMPLANT
CORD BIP STRL DISP 12FT (MISCELLANEOUS) ×2 IMPLANT
CUP MEDICINE 2OZ PLAST GRAD ST (MISCELLANEOUS) ×4 IMPLANT
DRAPE XRAY CASSETTE 23X24 (DRAPES) ×2 IMPLANT
ERASER HMR WETFIELD 18G (MISCELLANEOUS) ×2 IMPLANT
GLOVE BIO SURGEON STRL SZ8 (GLOVE) ×2 IMPLANT
GLOVE SURG LX 6.5 MICRO (GLOVE) ×1
GLOVE SURG LX 8.0 MICRO (GLOVE) ×1
GLOVE SURG LX STRL 6.5 MICRO (GLOVE) ×1 IMPLANT
GLOVE SURG LX STRL 8.0 MICRO (GLOVE) ×1 IMPLANT
GOWN STRL REUS W/ TWL LRG LVL3 (GOWN DISPOSABLE) ×1 IMPLANT
GOWN STRL REUS W/ TWL XL LVL3 (GOWN DISPOSABLE) ×1 IMPLANT
GOWN STRL REUS W/TWL LRG LVL3 (GOWN DISPOSABLE) ×1
GOWN STRL REUS W/TWL XL LVL3 (GOWN DISPOSABLE) ×1
LENS IOL ACRSF IQ ULTRA 18.0 (Intraocular Lens) ×1 IMPLANT
LENS IOL ACRYSOF IQ 18.0 (Intraocular Lens) ×2 IMPLANT
PACK CATARACT (MISCELLANEOUS) ×2 IMPLANT
PACK CATARACT DINGLEDEIN LX (MISCELLANEOUS) ×2 IMPLANT
PACK EYE AFTER SURG (MISCELLANEOUS) ×2 IMPLANT
SHLD EYE VISITEC  UNIV (MISCELLANEOUS) ×2 IMPLANT
SOL BSS BAG (MISCELLANEOUS) ×2
SOL PREP PVP 2OZ (MISCELLANEOUS) ×2
SOLUTION BSS BAG (MISCELLANEOUS) ×1 IMPLANT
SOLUTION PREP PVP 2OZ (MISCELLANEOUS) ×1 IMPLANT
SUT SILK 5-0 (SUTURE) ×2 IMPLANT
SYR 3ML LL SCALE MARK (SYRINGE) ×2 IMPLANT
SYR 5ML LL (SYRINGE) ×2 IMPLANT
SYR TB 1ML 27GX1/2 LL (SYRINGE) ×2 IMPLANT
WATER STERILE IRR 1000ML POUR (IV SOLUTION) ×2 IMPLANT
WIPE NON LINTING 3.25X3.25 (MISCELLANEOUS) ×2 IMPLANT

## 2014-09-08 NOTE — Anesthesia Preprocedure Evaluation (Addendum)
Anesthesia Evaluation  Patient identified by MRN, date of birth, ID band Patient awake    Reviewed: Allergy & Precautions, H&P , NPO status , Patient's Chart, lab work & pertinent test results, reviewed documented beta blocker date and time   Airway Mallampati: III  TM Distance: >3 FB Neck ROM: limited    Dental  (+) Poor Dentition, Chipped, Caps   Pulmonary sleep apnea , former smoker,  breath sounds clear to auscultation  Pulmonary exam normal       Cardiovascular Exercise Tolerance: Good hypertension, Normal cardiovascular exam+ dysrhythmias Rhythm:regular Rate:Normal     Neuro/Psych PSYCHIATRIC DISORDERS Depression negative neurological ROS     GI/Hepatic Neg liver ROS, GERD-  Controlled,  Endo/Other  negative endocrine ROS  Renal/GU Renal disease  negative genitourinary   Musculoskeletal  (+) Arthritis -,   Abdominal   Peds  Hematology negative hematology ROS (+)   Anesthesia Other Findings Past Medical History:   Sleep apnea                                                  Dysrhythmia                                                  Hypertension                                                 MVP (mitral valve prolapse)                                  GERD (gastroesophageal reflux disease)                       Arthritis                                                    Cancer                                                         Comment:bladder   HOH (hard of hearing)                                          Comment:aids   Kidney stones                                                Depression  Edema                                                          Comment:feet/ankles   Confusion                                                    Prostate disease                                             Reproductive/Obstetrics negative OB ROS                             Anesthesia Physical Anesthesia Plan  ASA: III  Anesthesia Plan: MAC   Post-op Pain Management:    Induction:   Airway Management Planned:   Additional Equipment:   Intra-op Plan:   Post-operative Plan:   Informed Consent: I have reviewed the patients History and Physical, chart, labs and discussed the procedure including the risks, benefits and alternatives for the proposed anesthesia with the patient or authorized representative who has indicated his/her understanding and acceptance.   Dental Advisory Given  Plan Discussed with: Anesthesiologist, CRNA and Surgeon  Anesthesia Plan Comments:         Anesthesia Quick Evaluation

## 2014-09-08 NOTE — Discharge Instructions (Addendum)
See handout.Eye Surgery Discharge Instructions  Expect mild scratchy sensation or mild soreness. DO NOT RUB YOUR EYE!  The day of surgery:  Minimal physical activity, but bed rest is not required  No reading, computer work, or close hand work  No bending, lifting, or straining.  May watch TV  For 24 hours:  No driving, legal decisions, or alcoholic beverages  Safety precautions  Eat anything you prefer: It is better to start with liquids, then soup then solid foods.  _____ Eye patch should be worn until postoperative exam tomorrow.  ____ Solar shield eyeglasses should be worn for comfort in the sunlight/patch while sleeping  Resume all regular medications including aspirin or Coumadin if these were discontinued prior to surgery. You may shower, bathe, shave, or wash your hair. Tylenol may be taken for mild discomfort.  Call your doctor if you experience significant pain, nausea, or vomiting, fever > 101 or other signs of infection. [phone] or [phone] Specific instructions:                                                                                                       AMBULATORY SURGERY  DISCHARGE INSTRUCTIONS   1) The drugs that you were given will stay in your system until tomorrow so for the next 24 hours you should not:  A) Drive an automobile B) Make any legal decisions C) Drink any alcoholic beverage   2) You may resume regular meals tomorrow.  Today it is better to start with liquids and gradually work up to solid foods.  You may eat anything you prefer, but it is better to start with liquids, then soup and crackers, and gradually work up to solid foods.   3) Please notify your doctor immediately if you have any unusual bleeding, trouble breathing, redness and pain at the surgery site, drainage, fever, or pain not relieved by medication.    4) Additional Instructions:        Please contact your physician with any problems or Same Day  Surgery at [phone], Monday through Friday 6 am to 4 pm, or [place] at [facility] number at [phone].

## 2014-09-08 NOTE — Anesthesia Postprocedure Evaluation (Signed)
  Anesthesia Post-op Note  Patient: Trevor Schmidt  Procedure(s) Performed: Procedure(s) with comments: CATARACT EXTRACTION PHACO AND INTRAOCULAR LENS PLACEMENT (IOC) (Right) - US:1:06.3 AP%:23.3 CDE:24.97 Fluid lot# [id number]  Anesthesia type:MAC  Patient location: PACU  Post pain: Pain level controlled  Post assessment: Post-op Vital signs reviewed, Patient's Cardiovascular Status Stable, Respiratory Function Stable, Patent Airway and No signs of Nausea or vomiting  Post vital signs: Reviewed and stable  Last Vitals:  Filed Vitals:   09/08/14 1005  BP: 158/68  Pulse: 50  Temp: 36.7 C  Resp: 16    Level of consciousness: awake, alert  and patient cooperative  Complications: No apparent anesthesia complications

## 2014-09-08 NOTE — Anesthesia Procedure Notes (Signed)
Procedure Name: MAC Performed by: COOK-MARTIN, Zanna Hawn Pre-anesthesia Checklist: Patient identified, Emergency Drugs available, Suction available, Patient being monitored and Timeout performed Patient Re-evaluated:Patient Re-evaluated prior to inductionOxygen Delivery Method: Nasal cannula Preoxygenation: Pre-oxygenation with 100% oxygen Intubation Type: IV induction Placement Confirmation: positive ETCO2 and CO2 detector       

## 2014-09-08 NOTE — H&P (Signed)
See scanned H&P

## 2014-09-08 NOTE — Anesthesia Postprocedure Evaluation (Signed)
  Anesthesia Post-op Note  Patient: Trevor Schmidt  Procedure(s) Performed: Procedure(s) with comments: CATARACT EXTRACTION PHACO AND INTRAOCULAR LENS PLACEMENT (I[facility]) (Right) - US:1:06.3 AP%:23.3 CDE:24.97 Fluid lot# [id number]  Anesthesia type:MAC  Patient location: PACU  Post pain: Pain level controlled  Post assessment: Post-op Vital signs reviewed, Patient's Cardiovascular Status Stable, Respiratory Function Stable, Patent Airway and No signs of Nausea or vomiting  Post vital signs: Reviewed and stable  Last Vitals:  Filed Vitals:   09/08/14 1018  BP: 151/69  Pulse: 51  Temp: 36.4 C  Resp: 16    Level of consciousness: awake, alert  and patient cooperative  Complications: No apparent anesthesia complications

## 2014-09-08 NOTE — Op Note (Signed)
Date of Surgery: 09/08/2014 Date of Dictation: 09/08/2014 9:56 AM Pre-operative Diagnosis:  Nuclear Sclerotic Cataract right Eye Post-operative Diagnosis: same Procedure performed: Extra-capsular Cataract Extraction (ECCE) with placement of a posterior chamber intraocular lens (IOL) right Eye IOL:  Implant Name Type Inv. Item Serial No. Manufacturer Lot No. LRB No. Used  LENS IOL ACRYSOF IQ 18.0 - [id number] Intraocular Lens LENS IOL ACRYSOF IQ 18.0 [id number] ALCON   Right 1   Anesthesia: 2% Lidocaine and 4% Marcaine in a 50/50 mixture with 10 unites/ml of Hylenex given as a peribulbar Anesthesiologist: Anesthesiologist: Andria Frames, MD CRNA: Vaughan Sine Complications: none Estimated Blood Loss: less than 1 ml  Description of procedure:  The patient was given anesthesia and sedation via intravenous access. The patient was then prepped and draped in the usual fashion. A 25-gauge needle was bent for initiating the capsulorhexis. A 5-0 silk suture was placed through the conjunctiva superior and inferiorly to serve as bridle sutures. Hemostasis was obtained at the superior limbus using an eraser cautery. A partial thickness groove was made at the anterior surgical limbus with a 64 Beaver blade and this was dissected anteriorly with an [employer]aya. The anterior chamber was entered at 10 o'clock with a 1.0 mm paracentesis knife and through the lamellar dissection with a 2.6 mm Alcon keratome. Epi-Shugarcaine 0.5 CC [9 cc BSS Plus (Alcon), 3 cc 4% preservative-free lidocaine (Hospira) and 4 cc 1:1000 preservative-free, bisulfite-free epinephrine] was injected via the paracentesis tract. DiscoVisc was injected to replace the aqueous and a continuous tear curvilinear capsulorhexis was performed using a bent 25-gauge needle.  Balance salt on a syringe was used to perform hydro-dissection and phacoemulsification was carried out using a divide and conquer technique. Procedure(s) with  comments: CATARACT EXTRACTION PHACO AND INTRAOCULAR LENS PLACEMENT (IOC) (Right) - US:1:06.3 AP%:23.3 CDE:24.97 Fluid lot# [id number]. Irrigation/aspiration was used to remove the residual cortex and the capsular bag was inflated with DiscoVisc. The intraocular lens was inserted into the capsular bag using a pre-loaded UltraSert Delivery System. Irrigation/aspiration was used to remove the residual DiscoVisc. The wound was inflated with balanced salt and checked for leaks. None were found. Miostat was injected via the paracentesis track and 0.1 ml of cefuroxime containing 1 mg of drug  was injected via the paracentesis track. The wound was checked for leaks again and none were found.   The bridal sutures were removed and two drops of Vigamox were placed on the eye. An eye shield was placed to protect the eye and the patient was discharged to the recovery area in good condition.   Kaylem Gidney MD

## 2014-09-08 NOTE — Interval H&P Note (Signed)
History and Physical Interval Note:  09/08/2014 9:07 AM  Trevor Schmidt  has presented today for surgery, with the diagnosis of CATARACT  The various methods of treatment have been discussed with the patient and family. After consideration of risks, benefits and other options for treatment, the patient has consented to  Procedure(s): CATARACT EXTRACTION PHACO AND INTRAOCULAR LENS PLACEMENT ([place]) (Right) as a surgical intervention .  The patient's history has been reviewed, patient examined, no change in status, stable for surgery.  I have reviewed the patient's chart and labs.  Questions were answered to the patient's satisfaction.     Carlin Mamone

## 2014-09-08 NOTE — Transfer of Care (Signed)
Immediate Anesthesia Transfer of Care Note  Patient: Trevor Schmidt  Procedure(s) Performed: Procedure(s) with comments: CATARACT EXTRACTION PHACO AND INTRAOCULAR LENS PLACEMENT (IOC) (Right) - US:1:06.3 AP%:23.3 CDE:24.97 Fluid lot# [id number]  Patient Location: PACU  Anesthesia Type:MAC  Level of Consciousness: awake, alert  and oriented  Airway & Oxygen Therapy: Patient Spontanous Breathing and Patient connected to nasal cannula oxygen  Post-op Assessment: Report given to RN and Post -op Vital signs reviewed and stable  Post vital signs: Reviewed and stable  Last Vitals:  Filed Vitals:   09/08/14 0826  BP: 152/76  Pulse: 52  Temp: 36.5 C  Resp: 18    Complications: No apparent anesthesia complications

## 2014-09-09 ENCOUNTER — Ambulatory Visit: Payer: Self-pay | Admitting: Urology

## 2014-10-07 ENCOUNTER — Ambulatory Visit (INDEPENDENT_AMBULATORY_CARE_PROVIDER_SITE_OTHER): Payer: PPO | Admitting: Family

## 2014-10-07 ENCOUNTER — Encounter: Payer: Self-pay | Admitting: Family

## 2014-10-07 VITALS — BP 138/76 | HR 54 | Temp 98.2°F | Resp 18 | Ht 71.0 in | Wt 209.8 lb

## 2014-10-07 DIAGNOSIS — I10 Essential (primary) hypertension: Secondary | ICD-10-CM

## 2014-10-07 DIAGNOSIS — N400 Enlarged prostate without lower urinary tract symptoms: Secondary | ICD-10-CM

## 2014-10-07 DIAGNOSIS — Z23 Encounter for immunization: Secondary | ICD-10-CM | POA: Diagnosis not present

## 2014-10-07 DIAGNOSIS — I341 Nonrheumatic mitral (valve) prolapse: Secondary | ICD-10-CM

## 2014-10-07 NOTE — Assessment & Plan Note (Signed)
Stable with no adverse symptoms. Refer to cardiology per patient request. Follow up if symptoms appear or become uncontrolled with current regimen.

## 2014-10-07 NOTE — Patient Instructions (Addendum)
Thank you for choosing [facility].  Summary/Instructions:  Please continue to take your medications as prescribed.   Please schedule a physical / preventative wellness visit at your convenience.  Referrals have been made during this visit. You should expect to hear back from our schedulers in about 7-10 days in regards to establishing an appointment with the specialists we discussed.   If your symptoms worsen or fail to improve, please contact our office for further instruction, or in case of emergency go directly to the emergency room at the closest medical facility.

## 2014-10-07 NOTE — Assessment & Plan Note (Signed)
Currently stable with no evidence of BPH symptoms with current regimen. Continue current dosage of finasteride. Follow-up if symptoms develop or is no longer controlled with current regimen.

## 2014-10-07 NOTE — Progress Notes (Signed)
Pre visit review using our clinic review tool, if applicable. No additional management support is needed unless otherwise documented below in the visit note. 

## 2014-10-07 NOTE — Progress Notes (Signed)
Subjective:    Patient ID: Trevor Schmidt, male    DOB: 06-11-1940, 74 y.o.   MRN: [id number]  Chief Complaint  Patient presents with  . Establish Care    Referral for cardiologist,     HPI:  Trevor Schmidt is a 74 y.o. male with a PMH of arthritis, bladder cancer, depression, GERD, sleep apnea, hypertension, mitral valve prolapse, decreased hearing, and prostate disease who presents today for an office visit to establish care.    1.) Hypertension - Previously diagnosed with hypertension and is currently maintained on ilmesartan, amlodipine and atenolol. Takes the medication as prescribed and denies adverse side effects. Home readings are averaging around 120-130 most days.   BP Readings from Last 3 Encounters:  10/07/14 138/76  09/08/14 151/69    2.) BPH - Currently maintained on finasteride and doxazosin. Reports his symptoms are well controlled with current medication. Takes the medication as prescribed and denies adverse side effects. Will be following up with urology, Dr. Arman Bogus, for his blladder cancer.   Lab Results  Component Value Date   PSA 2.9 08/27/2010    3.) Mitral valve prolapse - previously diagnosed with mitral valve prolapse and is followed by cardiology. Requesting referral new cardiologist. Denies any chest pain/discomfort, shortness of breath, or palpitations.   No Known Allergies   Outpatient Prescriptions Prior to Visit  Medication Sig Dispense Refill  . amLODipine (NORVASC) 10 MG tablet Take 10 mg by mouth daily.    Marland Kitchen amoxicillin (AMOXIL) 500 MG capsule Take 2,000 mg by mouth as needed.    Marland Kitchen aspirin 325 MG EC tablet Take 325 mg by mouth daily.    Marland Kitchen atenolol (TENORMIN) 50 MG tablet Take 50 mg by mouth daily.    Marland Kitchen atorvastatin (LIPITOR) 40 MG tablet Take 40 mg by mouth daily.    . calcium citrate (CALCITRATE - DOSED IN MG ELEMENTAL CALCIUM) 950 MG tablet Take 630 mg by mouth daily.    . citalopram (CELEXA) 40 MG tablet Take 40 mg by mouth daily.      Marland Kitchen doxazosin (CARDURA) 2 MG tablet Take 2 mg by mouth daily.    . finasteride (PROSCAR) 5 MG tablet Take 5 mg by mouth daily.    . magnesium oxide (MAG-OX) 400 MG tablet Take 400 mg by mouth daily.    Marland Kitchen olmesartan (BENICAR) 40 MG tablet Take 40 mg by mouth daily.    Marland Kitchen omeprazole (PRILOSEC) 20 MG capsule Take 20 mg by mouth daily.    . potassium chloride SA (K-DUR,KLOR-CON) 20 MEQ tablet Take 20 mEq by mouth 2 (two) times daily.    . vitamin B-12 (CYANOCOBALAMIN) 500 MCG tablet Take 500 mcg by mouth daily.     No facility-administered medications prior to visit.     Past Medical History  Diagnosis Date  . Sleep apnea   . Dysrhythmia   . Hypertension   . MVP (mitral valve prolapse)   . GERD (gastroesophageal reflux disease)   . Arthritis   . HOH (hard of hearing)     aids  . Kidney stones   . Depression   . Edema     feet/ankles  . Confusion   . Prostate disease   . Cancer     bladder     Past Surgical History  Procedure Laterality Date  . Back surgery    . Knee arthroscopy    . 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 Bladder  surgery      cancer  . Cystoscopy      litho x 2  . Eye surgery    . Wrist surgery      ctr  . Cataract extraction w/phaco Right 09/08/2014    Procedure: CATARACT EXTRACTION PHACO AND INTRAOCULAR LENS PLACEMENT (IOC);  Surgeon: Estill Cotta, MD;  Location: [facility] ORS;  Service: Ophthalmology;  Laterality: Right;  US:1:06.3 AP%:23.3 CDE:24.97 Fluid lot# [id number]     Family History  Problem Relation Age of Onset  . Heart disease Mother   . Prostate cancer Father   . Healthy Maternal Grandmother   . Healthy Maternal Grandfather   . Healthy Paternal Grandmother   . Healthy Paternal Grandfather      Social History   Social History  . Marital Status: Married    Spouse Name: N/A  . Number of Children: 4  . Years of Education: 12   Occupational History  . Retired    Social History Main Topics  . Smoking  status: Former Research scientist (life sciences)  . Smokeless tobacco: Current User    Types: Snuff  . Alcohol Use: No  . Drug Use: No  . Sexual Activity: Not on file   Other Topics Concern  . Not on file   Social History Narrative   Fun: Fish, woodworking    Review of Systems  Constitutional: Negative for fever and chills.  HENT: Positive for hearing loss.   Eyes:       Negative for changes in vision.  Respiratory: Negative for chest tightness and shortness of breath.   Cardiovascular: Negative for chest pain, palpitations and leg swelling.  Genitourinary: Negative for urgency and frequency.  Neurological: Negative for headaches.      Objective:    BP 138/76 mmHg  Pulse 54  Temp(Src) 98.2 F (36.8 C) (Oral)  Resp 18  Ht 5\' 11"  (1.803 m)  Wt 209 lb 12.8 oz (95.165 kg)  BMI 29.27 kg/m2  SpO2 97% Nursing note and vital signs reviewed.  Physical Exam  Constitutional: He is oriented to person, place, and time. He appears well-developed and well-nourished. No distress.  Cardiovascular: Normal rate, regular rhythm, normal heart sounds and intact distal pulses.  Exam reveals no gallop and no friction rub.   No murmur heard. Pulmonary/Chest: Effort normal and breath sounds normal.  Neurological: He is alert and oriented to person, place, and time.  Skin: Skin is warm and dry.  Psychiatric: He has a normal mood and affect. His behavior is normal. Judgment and thought content normal.       Assessment & Plan:   Problem List Items Addressed This Visit      Cardiovascular and Mediastinum   Mitral valve prolapse    Stable with no adverse symptoms. Refer to cardiology per patient request. Follow up if symptoms appear or become uncontrolled with current regimen.      Relevant Orders   Ambulatory referral to Cardiology   Essential hypertension - Primary    Stable with current regimen and below goal of 140/90. Denies adverse side effects. Monitors blood pressure at home adequately. Continue current  dose of Benicar, atenolol and amlodipine. Continue to monitor blood pressure at home. Follow-up for annual wellness visit.        Genitourinary   BPH (benign prostatic hyperplasia)    Currently stable with no evidence of BPH symptoms with current regimen. Continue current dosage of finasteride. Follow-up if symptoms develop or is no longer controlled with current regimen.  Other Visit Diagnoses    Need for TD vaccine        Relevant Orders    Td vaccine greater than or equal to 7yo preservative free IM (Completed)    Encounter for immunization

## 2014-10-07 NOTE — Assessment & Plan Note (Signed)
Stable with current regimen and below goal of 140/90. Denies adverse side effects. Monitors blood pressure at home adequately. Continue current dose of Benicar, atenolol and amlodipine. Continue to monitor blood pressure at home. Follow-up for annual wellness visit.

## 2014-10-14 ENCOUNTER — Ambulatory Visit (INDEPENDENT_AMBULATORY_CARE_PROVIDER_SITE_OTHER): Payer: PPO | Admitting: Cardiovascular Disease

## 2014-10-14 ENCOUNTER — Encounter: Payer: Self-pay | Admitting: Cardiovascular Disease

## 2014-10-14 VITALS — BP 118/80 | HR 53 | Ht 71.0 in | Wt 205.1 lb

## 2014-10-14 DIAGNOSIS — I4891 Unspecified atrial fibrillation: Secondary | ICD-10-CM | POA: Diagnosis not present

## 2014-10-14 DIAGNOSIS — I10 Essential (primary) hypertension: Secondary | ICD-10-CM | POA: Diagnosis not present

## 2014-10-14 DIAGNOSIS — I25810 Atherosclerosis of coronary artery bypass graft(s) without angina pectoris: Secondary | ICD-10-CM

## 2014-10-14 DIAGNOSIS — I447 Left bundle-branch block, unspecified: Secondary | ICD-10-CM

## 2014-10-14 DIAGNOSIS — I341 Nonrheumatic mitral (valve) prolapse: Secondary | ICD-10-CM

## 2014-10-14 DIAGNOSIS — E785 Hyperlipidemia, unspecified: Secondary | ICD-10-CM

## 2014-10-14 NOTE — Patient Instructions (Addendum)
Your physician has requested that you have an echocardiogram. Echocardiography is a painless test that uses sound waves to create images of your heart. It provides your doctor with information about the size and shape of your heart and how well your heart's chambers and valves are working. This procedure takes approximately one hour. There are no restrictions for this procedure.  Your physician has requested that you have a lexiscan myoview. For further information please visit [email or web address]. Please follow instruction sheet, as given.  Your physician has recommended you make the following change in your medication: the atenolol has been decreased to 25 mg daily. (1/2 of your 50 mg tablet)  Your physician recommends that you schedule a follow-up appointment in: 3 MONTHS with Dr. Claiborne Billings.

## 2014-10-15 ENCOUNTER — Other Ambulatory Visit: Payer: Self-pay

## 2014-10-15 ENCOUNTER — Telehealth: Payer: Self-pay | Admitting: Family

## 2014-10-15 MED ORDER — CITALOPRAM HYDROBROMIDE 40 MG PO TABS: 40.0000 mg | ORAL_TABLET | Freq: Every day | ORAL | Status: DC

## 2014-10-15 MED ORDER — ATENOLOL 50 MG PO TABS
50.0000 mg | ORAL_TABLET | Freq: Every day | ORAL | Status: DC
Start: 2014-10-15 — End: 2014-11-12

## 2014-10-15 NOTE — Telephone Encounter (Signed)
Received records from [place] forwarded 13 pages to Dr. Elna Breslow 10/15/14 fbg.

## 2014-10-16 ENCOUNTER — Encounter: Payer: Self-pay | Admitting: Cardiovascular Disease

## 2014-10-16 ENCOUNTER — Telehealth (HOSPITAL_COMMUNITY): Payer: Self-pay

## 2014-10-16 DIAGNOSIS — E785 Hyperlipidemia, unspecified: Secondary | ICD-10-CM | POA: Insufficient documentation

## 2014-10-16 DIAGNOSIS — I447 Left bundle-branch block, unspecified: Secondary | ICD-10-CM | POA: Insufficient documentation

## 2014-10-16 NOTE — Progress Notes (Signed)
Patient ID: Trevor Schmidt, male   DOB: 09-18-1940, 74 y.o.   MRN: [id number]     Primary: [name], FNP  PATIENT PROFILE: Trevor Schmidt is a 74 y.o. male  who is referred for cardiology evaluation to establish care.  Many years ago.  He had been seen by Dr. Mare Ferrari.  He is recently moved back to [place].   HPI:  Trevor Schmidt has a history of hypertension, and previous diagnosis of mitral valve prolapse.  Remotely, he had seen Dr. Mare Ferrari.  He states that a proximally 6 years ago.  He had undergone a cardiac catheterization and was not told of having significant coronary blockage.  He recently moved back to the [place] area.  He has a history of obstructive sleep apnea and has been on CPAP therapy.  He received a new machine in February 2016 after his old machine malfunction.  A review of records from the [facility] system reveals that there is also a history of remote PAF, hyperlipidemia, essential hypertension.  He denies any chest pain.  He is unaware of any recent cardiac arrhythmia.  He is using CPAP with 100% compliance.  He also has a history of hyperlipidemia and has been on atorvastatin.  He presents for evaluation.  Past Medical History  Diagnosis Date  . Sleep apnea   . Dysrhythmia   . Hypertension   . MVP (mitral valve prolapse)   . GERD (gastroesophageal reflux disease)   . Arthritis   . HOH (hard of hearing)     aids  . Kidney stones   . Depression   . Edema     feet/ankles  . Confusion   . Prostate disease   . Cancer     bladder    Past Surgical History  Procedure Laterality Date  . Back surgery    . Knee arthroscopy    . 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 Bladder surgery      cancer  . Cystoscopy      litho x 2  . Eye surgery    . Wrist surgery      ctr  . Cataract extraction w/phaco Right 09/08/2014    Procedure: CATARACT EXTRACTION PHACO AND INTRAOCULAR LENS PLACEMENT (IOC);  Surgeon: Estill Cotta, MD;  Location: [facility] ORS;  Service: Ophthalmology;  Laterality: Right;  US:1:06.3 AP%:23.3 CDE:24.97 Fluid lot# [id number]    No Known Allergies  Current Outpatient Prescriptions  Medication Sig Dispense Refill  . amLODipine (NORVASC) 10 MG tablet Take 10 mg by mouth daily.    Marland Kitchen amoxicillin (AMOXIL) 500 MG capsule Take 2,000 mg by mouth as needed (dental procedures).     Marland Kitchen aspirin 325 MG EC tablet Take 325 mg by mouth daily.    Marland Kitchen atorvastatin (LIPITOR) 40 MG tablet Take 40 mg by mouth daily.    . calcium citrate (CALCITRATE - DOSED IN MG ELEMENTAL CALCIUM) 950 MG tablet Take 630 mg by mouth daily.    Marland Kitchen doxazosin (CARDURA) 2 MG tablet Take 2 mg by mouth daily.    . finasteride (PROSCAR) 5 MG tablet Take 5 mg by mouth daily.    . magnesium oxide (MAG-OX) 400 MG tablet Take 400 mg by mouth daily.    Marland Kitchen olmesartan (BENICAR) 40 MG tablet Take 40 mg by mouth daily.    Marland Kitchen omeprazole (PRILOSEC) 20 MG capsule Take 20 mg by mouth daily.    . potassium chloride SA (K-DUR,KLOR-CON)  20 MEQ tablet Take 20 mEq by mouth 2 (two) times daily.    . vitamin B-12 (CYANOCOBALAMIN) 100 MCG tablet Take 100 mcg by mouth 2 (two) times daily.    Marland Kitchen atenolol (TENORMIN) 50 MG tablet Take 1 tablet (50 mg total) by mouth daily. 30 tablet 0  . citalopram (CELEXA) 40 MG tablet Take 1 tablet (40 mg total) by mouth daily. 30 tablet 0   No current facility-administered medications for this visit.    Social History   Social History  . Marital Status: Married    Spouse Name: N/A  . Number of Children: 4  . Years of Education: 12   Occupational History  . Retired    Social History Main Topics  . Smoking status: Former Research scientist (life sciences)  . Smokeless tobacco: Current User    Types: Snuff  . Alcohol Use: No  . Drug Use: No  . Sexual Activity: Not on file   Other Topics Concern  . Not on file   Social History Narrative   Fun: Fish, woodworking   Additional social history is notable that he is married for 42  years.  His 4 children, 9 grandchildren, and 2 great-grandchildren.  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  He completed 12 grades years of education.  He had smoked for 30 years but quit tobacco in 2003.  There is no EtOH use.  He walks 3 days per week for 30 minutes at a time with a brisk walk.  Family History  Problem Relation Age of Onset  . Heart disease Mother   . Prostate cancer Father   . Healthy Maternal Grandmother   . Healthy Maternal Grandfather   . Healthy Paternal Grandmother   . Healthy Paternal Grandfather     ROS General: Negative; No fevers, chills, or night sweats HEENT: Negative; No changes in vision or hearing, sinus congestion, difficulty swallowing Pulmonary: Negative; No cough, wheezing, shortness of breath, hemoptysis Cardiovascular:  See HPI; No chest pain, presyncope, syncope, palpitations, edema GI: Negative; No nausea, vomiting, diarrhea, or abdominal pain GU: Remote history of bladder tumor. Musculoskeletal: Negative; no myalgias, joint pain, or weakness Hematologic/Oncologic: Negative; no easy bruising, bleeding Endocrine: Negative; no heat/cold intolerance; no diabetes Neuro: Negative; no changes in balance, headaches Skin: Negative; No rashes or skin lesions Psychiatric: Negative; No behavioral problems, depression Sleep: Negative; No daytime sleepiness, hypersomnolence, bruxism, restless legs, hypnogagnic hallucinations Other comprehensive 14 point system review is negative   Physical Exam BP 118/80 mmHg  Pulse 53  Ht 5\' 11"  (1.803 m)  Wt 205 lb 1.8 oz (93.037 kg)  BMI 28.62 kg/m2  Wt Readings from Last 3 Encounters:  10/14/14 205 lb 1.8 oz (93.037 kg)  10/07/14 209 lb 12.8 oz (95.165 kg)  09/08/14 207 lb (93.895 kg)   General: Alert, oriented, no distress.  Skin: normal turgor, no rashes, warm and dry HEENT: Normocephalic, atraumatic. Pupils equal round and reactive to light; sclera anicteric; extraocular  muscles intact; Fundi without hemorrhages or exudates.  Disks flat. Nose without nasal septal hypertrophy Mouth/Parynx benign; Mallinpatti scale 3 Neck: No JVD, no carotid bruits; normal carotid upstroke Lungs: clear to ausculatation and percussion; no wheezing or rales Chest wall: Positive pectus escavatum. Heart: PMI not displaced, RRR, s1 s2 normal, 1/6 systolic murmur, no diastolic murmur, no rubs, gallops, thrills, or heaves Abdomen: soft, nontender; no hepatosplenomehaly, BS+; abdominal aorta nontender and not dilated by palpation. Back: no CVA tenderness Pulses 2+ Musculoskeletal: full range of motion,  normal strength, no joint deformities Extremities: no clubbing cyanosis or edema, Homan's sign negative  Neurologic: grossly nonfocal; Cranial nerves grossly wnl Psychologic: Normal mood and affect   ECG (independently read by me): Sinus bradycardia 52 bpm per first-degree AV block with a PR interval of 212 ms.  Left bundle branch block with repolarization changes.  LABS:  No flowsheet data found.   No flowsheet data found.  No flowsheet data found. No results found for: MCV No results found for: TSH No results found for: HGBA1C   BNP No results found for: BNP  ProBNP No results found for: PROBNP   Lipid Panel  No results found for: CHOL, TRIG, HDL, CHOLHDL, VLDL, LDLCALC, LDLDIRECT  RADIOLOGY: No results found.   ASSESSMENT AND PLAN: Trevor Schmidt is a 74 year old white male who has a history of hypertension and has been treated with atenolol 50 mg daily, Cardura 2 mg, amlodipine 5 mg in addition to Benicar 40 mg.  His blood pressure today is stable and on repeat by me was 122/78.  He does have sinus bradycardia and left bundle branch block with repolarization changes on his electrocardiogram.  He states he had undergone a remote cardiac catheterization years ago.  I do not have the specifics of this procedure, but he does not recall being told of having  significant blockage.  He is on atorvastatin for hyperlipidemia.  He is on CPAP for sleep apnea and recently received a new ResMed air since 10 CPAP machine.  He admits to 100% compliance.  Presently, I am recommending slight reduction of his atenolol from 50 mg daily to 25 mg daily since his blood pressure is well controlled and he is bradycardic at 52 bpm.  I am scheduling him for an echo Doppler study for assessment of systolic and diastolic function as well as valvular architecture.  I will obtain a nuclear perfusion study in a Lexiscan format in light of his left bundle branch block and cardiac risk factors.  He reportedly had had blood work done in July and I'll try to obtain these results.  I will see him in 3 months for cardiology reevaluation.   Troy Sine, MD, [facility] 10/16/2014 6:27 PM

## 2014-10-16 NOTE — Telephone Encounter (Signed)
Encounter complete. 

## 2014-10-21 ENCOUNTER — Ambulatory Visit (HOSPITAL_COMMUNITY)
Admission: RE | Admit: 2014-10-21 | Discharge: 2014-10-21 | Disposition: A | Discharge status: Home / Self Care | Payer: PPO | Source: Ambulatory Visit | Attending: Cardiovascular Disease | Admitting: Cardiovascular Disease

## 2014-10-21 DIAGNOSIS — I25810 Atherosclerosis of coronary artery bypass graft(s) without angina pectoris: Secondary | ICD-10-CM

## 2014-10-21 DIAGNOSIS — I4891 Unspecified atrial fibrillation: Secondary | ICD-10-CM

## 2014-10-21 DIAGNOSIS — I517 Cardiomegaly: Secondary | ICD-10-CM | POA: Insufficient documentation

## 2014-10-21 DIAGNOSIS — Z8249 Family history of ischemic heart disease and other diseases of the circulatory system: Secondary | ICD-10-CM | POA: Diagnosis not present

## 2014-10-21 DIAGNOSIS — I10 Essential (primary) hypertension: Secondary | ICD-10-CM

## 2014-10-21 DIAGNOSIS — F17200 Nicotine dependence, unspecified, uncomplicated: Secondary | ICD-10-CM | POA: Insufficient documentation

## 2014-10-21 DIAGNOSIS — I447 Left bundle-branch block, unspecified: Secondary | ICD-10-CM

## 2014-10-21 DIAGNOSIS — R9439 Abnormal result of other cardiovascular function study: Secondary | ICD-10-CM | POA: Diagnosis not present

## 2014-10-21 DIAGNOSIS — G4733 Obstructive sleep apnea (adult) (pediatric): Secondary | ICD-10-CM | POA: Diagnosis not present

## 2014-10-21 LAB — MYOCARDIAL PERFUSION IMAGING
CHL CUP RESTING HR STRESS: 52 {beats}/min
LV dias vol: 226 mL
LV sys vol: 148 mL
NUC STRESS TID: 1.11
Peak HR: 63 {beats}/min
SDS: 1
SRS: 5
SSS: 6

## 2014-10-21 MED ORDER — REGADENOSON 0.4 MG/5ML IV SOLN
0.4000 mg | Freq: Once | INTRAVENOUS | Status: AC
  Administered 2014-10-21: 0.4 mg via INTRAVENOUS

## 2014-10-21 MED ORDER — TECHNETIUM TC 99M SESTAMIBI GENERIC - CARDIOLITE
10.5000 | Freq: Once | INTRAVENOUS | Status: AC | PRN
  Administered 2014-10-21: 11 via INTRAVENOUS

## 2014-10-21 MED ORDER — TECHNETIUM TC 99M SESTAMIBI GENERIC - CARDIOLITE
30.9000 | Freq: Once | INTRAVENOUS | Status: AC | PRN
  Administered 2014-10-21: 30.9 via INTRAVENOUS

## 2014-10-22 ENCOUNTER — Encounter: Payer: Self-pay | Admitting: Cardiovascular Disease

## 2014-10-23 ENCOUNTER — Ambulatory Visit (HOSPITAL_COMMUNITY): Payer: PPO | Attending: Cardiology

## 2014-10-23 ENCOUNTER — Other Ambulatory Visit: Payer: Self-pay

## 2014-10-23 DIAGNOSIS — I340 Nonrheumatic mitral (valve) insufficiency: Secondary | ICD-10-CM | POA: Insufficient documentation

## 2014-10-23 DIAGNOSIS — I25810 Atherosclerosis of coronary artery bypass graft(s) without angina pectoris: Secondary | ICD-10-CM

## 2014-10-23 DIAGNOSIS — I517 Cardiomegaly: Secondary | ICD-10-CM | POA: Diagnosis not present

## 2014-10-23 DIAGNOSIS — I77810 Thoracic aortic ectasia: Secondary | ICD-10-CM | POA: Diagnosis not present

## 2014-10-23 DIAGNOSIS — I447 Left bundle-branch block, unspecified: Secondary | ICD-10-CM | POA: Diagnosis not present

## 2014-10-23 DIAGNOSIS — N400 Enlarged prostate without lower urinary tract symptoms: Secondary | ICD-10-CM | POA: Insufficient documentation

## 2014-10-23 DIAGNOSIS — I4891 Unspecified atrial fibrillation: Secondary | ICD-10-CM | POA: Diagnosis not present

## 2014-10-23 DIAGNOSIS — I10 Essential (primary) hypertension: Secondary | ICD-10-CM | POA: Diagnosis not present

## 2014-10-23 DIAGNOSIS — I351 Nonrheumatic aortic (valve) insufficiency: Secondary | ICD-10-CM | POA: Diagnosis not present

## 2014-10-27 ENCOUNTER — Encounter: Payer: Self-pay | Admitting: Cardiovascular Disease

## 2014-10-28 ENCOUNTER — Encounter: Payer: Self-pay | Admitting: Cardiovascular Disease

## 2014-11-03 ENCOUNTER — Other Ambulatory Visit: Payer: Self-pay | Admitting: Family

## 2014-11-10 ENCOUNTER — Telehealth: Payer: Self-pay | Admitting: *Deleted

## 2014-11-10 NOTE — Telephone Encounter (Signed)
-----   Message from Troy Sine, MD sent at 11/09/2014 12:46 PM EDT ----- EF 30%; ? Duration;  F/u ov

## 2014-11-10 NOTE — Telephone Encounter (Signed)
Informed patient's wife of echo results. Appointment given to see Dr Claiborne Billings on 11/28/14. Wife wanted me to inform Dr. Claiborne Billings that the patient's heart rate has been consistently staying around 49 despite the dose change of the beta blocker. Message routed to Dr. Claiborne Billings for review and recommendations.

## 2014-11-11 NOTE — Telephone Encounter (Signed)
Can decrease to atenolol to 25 mg bid from 50/25

## 2014-11-12 ENCOUNTER — Other Ambulatory Visit: Payer: Self-pay | Admitting: *Deleted

## 2014-11-12 MED ORDER — CARVEDILOL 3.125 MG PO TABS: 3.1250 mg | ORAL_TABLET | Freq: Two times a day (BID) | ORAL | Status: DC

## 2014-11-12 NOTE — Telephone Encounter (Signed)
DC atenolol; start carvedilol 3.125 mg bid and hold if P < 54

## 2014-11-12 NOTE — Telephone Encounter (Signed)
Patient's wife called and notified per Dr Claiborne Billings to stop the atenolol. Start carvedilol. Hold if HR < 54. She voiced her understanding. She will get the prescription and start it tomorrow.

## 2014-11-12 NOTE — Telephone Encounter (Signed)
Spoke with patient's wife informing her of Dr. Claiborne Billings medication change recommendations. She informed me that at the last office visit Dr. Claiborne Billings decreased the atenolol to 25 mg daily. I will inform Dr. Claiborne Billings and call her back with further recommendations.

## 2014-11-28 ENCOUNTER — Encounter: Payer: Self-pay | Admitting: Cardiovascular Disease

## 2014-11-28 ENCOUNTER — Ambulatory Visit (INDEPENDENT_AMBULATORY_CARE_PROVIDER_SITE_OTHER): Payer: PPO | Admitting: Cardiovascular Disease

## 2014-11-28 VITALS — BP 140/78 | HR 62 | Ht 71.0 in | Wt 209.5 lb

## 2014-11-28 DIAGNOSIS — I447 Left bundle-branch block, unspecified: Secondary | ICD-10-CM

## 2014-11-28 DIAGNOSIS — D689 Coagulation defect, unspecified: Secondary | ICD-10-CM

## 2014-11-28 DIAGNOSIS — R931 Abnormal findings on diagnostic imaging of heart and coronary circulation: Secondary | ICD-10-CM

## 2014-11-28 DIAGNOSIS — E785 Hyperlipidemia, unspecified: Secondary | ICD-10-CM | POA: Insufficient documentation

## 2014-11-28 DIAGNOSIS — Z9989 Dependence on other enabling machines and devices: Secondary | ICD-10-CM

## 2014-11-28 DIAGNOSIS — G4733 Obstructive sleep apnea (adult) (pediatric): Secondary | ICD-10-CM

## 2014-11-28 DIAGNOSIS — I10 Essential (primary) hypertension: Secondary | ICD-10-CM

## 2014-11-28 DIAGNOSIS — Z01818 Encounter for other preprocedural examination: Secondary | ICD-10-CM

## 2014-11-28 DIAGNOSIS — I255 Ischemic cardiomyopathy: Secondary | ICD-10-CM | POA: Diagnosis not present

## 2014-11-28 NOTE — Progress Notes (Signed)
Patient ID: Trevor Schmidt, male   DOB: 07/10/1940, 74 y.o.   MRN: [id number]     Primary: [name], FNP    HPI:  Trevor Schmidt recently establish cardiology care with me 6 weeks ago.  He presents for follow-up evaluation.    Trevor Schmidt has a history of hypertension, and previous diagnosis of mitral valve prolapse.  Remotely, he had seen Dr. Mare Ferrari.  In 2001 he had undergone a cardiac catheterization and was not told of having significant coronary blockage.  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  He recently moved back to the [place] area.  He has seen Dr. Ubaldo Glassing last year.  He has a history of obstructive sleep apnea and has been on CPAP therapy.  He received a new machine in February 2016 after his old machine malfunction.  A review of records from the [facility] system reveals that there is also a history of remote PAF, hyperlipidemia, essential hypertension.  He denies any chest pain.  He is unaware of any recent cardiac arrhythmia.  He is using CPAP with 100% compliance.  He also has a history of hyperlipidemia and has been on atorvastatin.    When I initially saw him, I scheduled him for an echo Doppler study and nuclear stress test.  The echo Doppler study showed an ejection fraction of 30% with mild LVH and severe hypokinesis of the septal wall and inferior walls.  There was septal lateral dyssynergy.  There was grade 1 diastolic dysfunction.  There was no evidence for aortic valve stenosis, but there was mild aortic insufficiency.  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  He had mitral annular calcification with trivial Trevor, mild left atrial dilatation, and mild right atrial dilatation, he underwent a nuclear perfusion study which revealed concordant data with an ejection fraction of 34%.  He has underlying left bundle branch block.  A reversible septal and inferior perfusion defect was demonstrated.  He presents  for follow-up evaluation.  Past Medical History  Diagnosis Date  . Sleep apnea   . Dysrhythmia   . Hypertension   . MVP (mitral valve prolapse)   . GERD (gastroesophageal reflux disease)   . Arthritis   . HOH (hard of hearing)     aids  . Kidney stones   . Depression   . Edema     feet/ankles  . Confusion   . Prostate disease   . Cancer [facility])     bladder    Past Surgical History  Procedure Laterality Date  . Back surgery    . Knee arthroscopy    . 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 Bladder surgery      cancer  . Cystoscopy      litho x 2  . Eye surgery    . Wrist surgery      ctr  . Cataract extraction w/phaco Right 09/08/2014    Procedure: CATARACT EXTRACTION PHACO AND INTRAOCULAR LENS PLACEMENT (IOC);  Surgeon: Estill Cotta, MD;  Location: [facility] ORS;  Service: Ophthalmology;  Laterality: Right;  US:1:06.3 AP%:23.3 CDE:24.97 Fluid lot# [id number] H    Allergies  Allergen Reactions  . Ceftriaxone Rash    Current Outpatient Prescriptions  Medication Sig Dispense Refill  . amLODipine (NORVASC) 10 MG tablet Take 10 mg by mouth daily.    Marland Kitchen amoxicillin (AMOXIL) 500 MG capsule Take 2,000 mg by mouth as needed (  dental procedures).     Marland Kitchen aspirin 325 MG EC tablet Take 325 mg by mouth daily.    Marland Kitchen atorvastatin (LIPITOR) 40 MG tablet Take 40 mg by mouth daily.    . calcium citrate (CALCITRATE - DOSED IN MG ELEMENTAL CALCIUM) 950 MG tablet Take 630 mg by mouth 2 (two) times daily.     . carvedilol (COREG) 3.125 MG tablet Take 1 tablet (3.125 mg total) by mouth 2 (two) times daily. 60 tablet 6  . citalopram (CELEXA) 40 MG tablet TAKE 1 TABLET (40 MG TOTAL) BY MOUTH DAILY. 30 tablet 3  . Cyanocobalamin (VITAMIN B-12 PO) Take 100 mcg by mouth daily.    Marland Kitchen doxazosin (CARDURA) 2 MG tablet Take 2 mg by mouth daily.    . finasteride (PROSCAR) 5 MG tablet Take 5 mg by mouth daily.    . magnesium oxide (MAG-OX) 400 MG tablet Take 400 mg by mouth daily.    Marland Kitchen  olmesartan (BENICAR) 40 MG tablet Take 40 mg by mouth daily.    Marland Kitchen omeprazole (PRILOSEC) 20 MG capsule Take 20 mg by mouth daily.    . potassium chloride SA (K-DUR,KLOR-CON) 20 MEQ tablet Take 20 mEq by mouth 2 (two) times daily.     No current facility-administered medications for this visit.    Social History   Social History  . Marital Status: Married    Spouse Name: N/A  . Number of Children: 4  . Years of Education: 12   Occupational History  . Retired    Social History Main Topics  . Smoking status: Former Research scientist (life sciences)  . Smokeless tobacco: Current User    Types: Snuff  . Alcohol Use: No  . Drug Use: No  . Sexual Activity: Not on file   Other Topics Concern  . Not on file   Social History Narrative   Fun: Fish, woodworking   Additional social history is notable that he is married for 42 years.  His 4 children, 9 grandchildren, and 2 great-grandchildren.  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  He completed 12 grades years of education.  He had smoked for 30 years but quit tobacco in 2003.  There is no EtOH use.  He walks 3 days per week for 30 minutes at a time with a brisk walk.  Family History  Problem Relation Age of Onset  . Heart disease Mother   . Prostate cancer Father   . Healthy Maternal Grandmother   . Healthy Maternal Grandfather   . Healthy Paternal Grandmother   . Healthy Paternal Grandfather     ROS General: Negative; No fevers, chills, or night sweats HEENT: Negative; No changes in vision or hearing, sinus congestion, difficulty swallowing Pulmonary: Negative; No cough, wheezing, shortness of breath, hemoptysis Cardiovascular:  See HPI; No chest pain, presyncope, syncope, palpitations, edema GI: Negative; No nausea, vomiting, diarrhea, or abdominal pain GU: Remote history of bladder tumor. Musculoskeletal: Negative; no myalgias, joint pain, or weakness Hematologic/Oncologic: Negative; no easy bruising,  bleeding Endocrine: Negative; no heat/cold intolerance; no diabetes Neuro: Negative; no changes in balance, headaches Skin: Negative; No rashes or skin lesions Psychiatric: Negative; No behavioral problems, depression Sleep: Negative; No daytime sleepiness, hypersomnolence, bruxism, restless legs, hypnogagnic hallucinations Other comprehensive 14 point system review is negative   Physical Exam BP 140/78 mmHg  Pulse 62  Ht 5\' 11"  (1.803 m)  Wt 209 lb 8 oz (95.029 kg)  BMI 29.23 kg/m2  Wt Readings from Last 3  Encounters:  11/28/14 209 lb 8 oz (95.029 kg)  10/21/14 205 lb (92.987 kg)  10/14/14 205 lb 1.8 oz (93.037 kg)   General: Alert, oriented, no distress.  Skin: normal turgor, no rashes, warm and dry HEENT: Normocephalic, atraumatic. Pupils equal round and reactive to light; sclera anicteric; extraocular muscles intact; Fundi without hemorrhages or exudates.  Disks flat. Nose without nasal septal hypertrophy Mouth/Parynx benign; Mallinpatti scale 3 Neck: No JVD, no carotid bruits; normal carotid upstroke Lungs: clear to ausculatation and percussion; no wheezing or rales Chest wall: Positive pectus escavatum. Heart: PMI not displaced, RRR, s1 s2 normal, 1/6 systolic murmur, no diastolic murmur, no rubs, gallops, thrills, or heaves Abdomen: soft, nontender; no hepatosplenomehaly, BS+; abdominal aorta nontender and not dilated by palpation. Back: no CVA tenderness Pulses 2+ Musculoskeletal: full range of motion, normal strength, no joint deformities Extremities: no clubbing cyanosis or edema, Homan's sign negative  Neurologic: grossly nonfocal; Cranial nerves grossly wnl Psychologic: Normal mood and affect   10/14/2014 ECG (independently read by me): Sinus bradycardia 52 bpm per first-degree AV block with a PR interval of 212 ms.  Left bundle branch block with repolarization changes.  LABS:  No flowsheet data found.   No flowsheet data found.  No flowsheet data  found. No results found for: MCV No results found for: TSH No results found for: HGBA1C   BNP No results found for: BNP  ProBNP No results found for: PROBNP   Lipid Panel  No results found for: CHOL, TRIG, HDL, CHOLHDL, VLDL, LDLCALC, LDLDIRECT  RADIOLOGY: No results found.   ASSESSMENT AND PLAN: Trevor Schmidt is a 74 year old white male who has a history of hypertension and has been treated with atenolol 50 mg daily, Cardura 2 mg, amlodipine 5 mg in addition to Benicar 40 mg. When I initially saw him, he was bradycardic and I reduced his atenolol down to 25 mg.  He had undergone cardiac catheterization prior to his brain aneurysm surgery with subsequent VP shunt over 10 years ago and was not told of having coronary blockage.  I reviewed his recent echo Doppler study and nuclear study.  This shows reduced LV function with an ejection fraction of approximately 30% and suggests ischemia in both the septal and inferior walls with associated wall motion abnormality.  It is my recommendation that he undergo definitive cardiac catheterization for further evaluation of this significant abnormality.  He denies any episodes of chest pain.  He denies any significant dyspnea.  Due to scheduling complex with his wife.  Next week.  The catheterization will be scheduled for the following week.  The risks and benefits of a cardiac catheterization including, but not limited to, death, stroke, MI, kidney damage and bleeding were discussed with the patient who indicates understanding and agrees to proceed.  Marland Kitchen  He also has a history of hyperlipidemia and is on atorvastatin    He is on CPAP for sleep apnea and recently received a new ResMed air since 10 CPAP machine.  He admits to 100% compliance.  I reviewed his recent download.  AHI is excellent at 2.1.  He is averaging over 7/2 hours of sleep per night.  At present, he will continue his current dose of Benicar 40 mg, Dr. Brynda Rim and 2 mg, carvedilol 3.125 mg  twice a day, and amlodipine 10 mg.  Laboratory will be obtained next week prior to his planned catheterization the following week.   Troy Sine, MD, [facility] 11/28/2014 12:59 PM

## 2014-11-28 NOTE — Patient Instructions (Signed)
Your physician has requested that you have a cardiac catheterization. Cardiac catheterization is used to diagnose and/or treat various heart conditions. Doctors may recommend this procedure for a number of different reasons. The most common reason is to evaluate chest pain. Chest pain can be a symptom of coronary artery disease (CAD), and cardiac catheterization can show whether plaque is narrowing or blocking your heart's arteries. This procedure is also used to evaluate the valves, as well as measure the blood flow and oxygen levels in different parts of your heart. For further information please visit [email or web address].  This will be done the week of November 6th by Dr. Claiborne Billings.  Following your catheterization, you will not be allowed to drive for 3 days.  No lifting, pushing, or pulling greater that 10 pounds is allowed for 1 week.  You will be required to have the following tests prior to the procedure:  1. Blood work-the blood work can be done no more than 7 days prior to the procedure.  It can be done at any [facility] lab.  There is one downstairs on the first floor of this building and one in the [facility] building [phone]).  2. Chest Xray-the chest xray order has already been placed at the [facility].      Puncture site: RIGHT RADIAL

## 2014-12-01 ENCOUNTER — Other Ambulatory Visit: Payer: Self-pay | Admitting: Cardiovascular Disease

## 2014-12-01 MED ORDER — POTASSIUM CHLORIDE CRYS ER 20 MEQ PO TBCR: 20.0000 meq | EXTENDED_RELEASE_TABLET | Freq: Two times a day (BID) | ORAL | Status: DC

## 2014-12-01 NOTE — Telephone Encounter (Signed)
Rx(s) requested were sent to preferred pharmacy electronically. 

## 2014-12-01 NOTE — Telephone Encounter (Signed)
°  STAT if patient is at the pharmacy , call can be transferred to refill team.   1. Which medications need to be refilled? Klor- con 60mEq  2. Which pharmacy/location is medication to be sent to? [facility] on [address]   3. Do they need a 30 day or 90 day supply? She did not say

## 2014-12-02 ENCOUNTER — Ambulatory Visit
Admission: RE | Admit: 2014-12-02 | Discharge: 2014-12-02 | Disposition: A | Discharge status: Home / Self Care | Payer: PPO | Source: Ambulatory Visit | Attending: Cardiovascular Disease | Admitting: Cardiovascular Disease

## 2014-12-02 DIAGNOSIS — Z01818 Encounter for other preprocedural examination: Secondary | ICD-10-CM

## 2014-12-02 LAB — CBC
HCT: 38.7 % — ABNORMAL LOW (ref 39.0–52.0)
Hemoglobin: 13.7 g/dL (ref 13.0–17.0)
MCH: 30.3 pg (ref 26.0–34.0)
MCHC: 35.4 g/dL (ref 30.0–36.0)
MCV: 85.6 fL (ref 78.0–100.0)
MPV: 10.5 fL (ref 8.6–12.4)
Platelets: 178 10*3/uL (ref 150–400)
RBC: 4.52 MIL/uL (ref 4.22–5.81)
RDW: 14.3 % (ref 11.5–15.5)
WBC: 6.8 10*3/uL (ref 4.0–10.5)

## 2014-12-02 LAB — PROTIME-INR
INR: 1.1 (ref <1.50)
Prothrombin Time: 14.3 seconds (ref 11.6–15.2)

## 2014-12-02 LAB — APTT: APTT: 35 s (ref 24–37)

## 2014-12-03 ENCOUNTER — Other Ambulatory Visit: Payer: Self-pay | Admitting: *Deleted

## 2014-12-03 DIAGNOSIS — Z01818 Encounter for other preprocedural examination: Secondary | ICD-10-CM

## 2014-12-03 LAB — BASIC METABOLIC PANEL
BUN: 16 mg/dL (ref 7–25)
CALCIUM: 8.6 mg/dL (ref 8.6–10.3)
CO2: 26 mmol/L (ref 20–31)
CREATININE: 0.81 mg/dL (ref 0.70–1.18)
Chloride: 106 mmol/L (ref 98–110)
Glucose, Bld: 120 mg/dL — ABNORMAL HIGH (ref 65–99)
Potassium: 3.4 mmol/L — ABNORMAL LOW (ref 3.5–5.3)
Sodium: 142 mmol/L (ref 135–146)

## 2014-12-09 ENCOUNTER — Encounter (HOSPITAL_COMMUNITY): Payer: Self-pay | Admitting: Cardiovascular Disease

## 2014-12-09 ENCOUNTER — Ambulatory Visit (HOSPITAL_COMMUNITY)
Admission: RE | Admit: 2014-12-09 | Discharge: 2014-12-09 | Disposition: A | Discharge status: Home / Self Care | Payer: PPO | Source: Ambulatory Visit | Attending: Cardiovascular Disease | Admitting: Cardiovascular Disease

## 2014-12-09 ENCOUNTER — Encounter (HOSPITAL_COMMUNITY)
Admission: RE | Disposition: A | Discharge status: Home / Self Care | Payer: Self-pay | Source: Ambulatory Visit | Attending: Cardiovascular Disease

## 2014-12-09 DIAGNOSIS — Z7982 Long term (current) use of aspirin: Secondary | ICD-10-CM | POA: Diagnosis not present

## 2014-12-09 DIAGNOSIS — G4733 Obstructive sleep apnea (adult) (pediatric): Secondary | ICD-10-CM | POA: Insufficient documentation

## 2014-12-09 DIAGNOSIS — Z87442 Personal history of urinary calculi: Secondary | ICD-10-CM | POA: Insufficient documentation

## 2014-12-09 DIAGNOSIS — E785 Hyperlipidemia, unspecified: Secondary | ICD-10-CM | POA: Insufficient documentation

## 2014-12-09 DIAGNOSIS — Z982 Presence of cerebrospinal fluid drainage device: Secondary | ICD-10-CM | POA: Insufficient documentation

## 2014-12-09 DIAGNOSIS — F329 Major depressive disorder, single episode, unspecified: Secondary | ICD-10-CM | POA: Diagnosis not present

## 2014-12-09 DIAGNOSIS — I10 Essential (primary) hypertension: Secondary | ICD-10-CM | POA: Insufficient documentation

## 2014-12-09 DIAGNOSIS — Z87891 Personal history of nicotine dependence: Secondary | ICD-10-CM | POA: Insufficient documentation

## 2014-12-09 DIAGNOSIS — I341 Nonrheumatic mitral (valve) prolapse: Secondary | ICD-10-CM | POA: Diagnosis not present

## 2014-12-09 DIAGNOSIS — G4730 Sleep apnea, unspecified: Secondary | ICD-10-CM | POA: Insufficient documentation

## 2014-12-09 DIAGNOSIS — K219 Gastro-esophageal reflux disease without esophagitis: Secondary | ICD-10-CM | POA: Diagnosis not present

## 2014-12-09 DIAGNOSIS — M1990 Unspecified osteoarthritis, unspecified site: Secondary | ICD-10-CM | POA: Diagnosis not present

## 2014-12-09 DIAGNOSIS — Z01818 Encounter for other preprocedural examination: Secondary | ICD-10-CM

## 2014-12-09 DIAGNOSIS — I447 Left bundle-branch block, unspecified: Secondary | ICD-10-CM | POA: Insufficient documentation

## 2014-12-09 DIAGNOSIS — I2582 Chronic total occlusion of coronary artery: Secondary | ICD-10-CM | POA: Insufficient documentation

## 2014-12-09 DIAGNOSIS — I480 Paroxysmal atrial fibrillation: Secondary | ICD-10-CM | POA: Insufficient documentation

## 2014-12-09 DIAGNOSIS — I255 Ischemic cardiomyopathy: Secondary | ICD-10-CM | POA: Insufficient documentation

## 2014-12-09 DIAGNOSIS — I2510 Atherosclerotic heart disease of native coronary artery without angina pectoris: Secondary | ICD-10-CM | POA: Insufficient documentation

## 2014-12-09 DIAGNOSIS — I671 Cerebral aneurysm, nonruptured: Secondary | ICD-10-CM | POA: Insufficient documentation

## 2014-12-09 HISTORY — PX: CARDIAC CATHETERIZATION: SHX172

## 2014-12-09 LAB — POTASSIUM: POTASSIUM: 3.4 mmol/L — AB (ref 3.5–5.1)

## 2014-12-09 SURGERY — LEFT HEART CATH AND CORONARY ANGIOGRAPHY
Anesthesia: LOCAL

## 2014-12-09 MED ORDER — POTASSIUM CHLORIDE CRYS ER 20 MEQ PO TBCR
EXTENDED_RELEASE_TABLET | ORAL | Status: AC
  Filled 2014-12-09: qty 2

## 2014-12-09 MED ORDER — HEPARIN SODIUM (PORCINE) 1000 UNIT/ML IJ SOLN
INTRAMUSCULAR | Status: AC
  Filled 2014-12-09: qty 1

## 2014-12-09 MED ORDER — SODIUM CHLORIDE 0.9 % IJ SOLN: 3.0000 mL | Freq: Two times a day (BID) | INTRAMUSCULAR | Status: DC

## 2014-12-09 MED ORDER — MIDAZOLAM HCL 2 MG/2ML IJ SOLN
INTRAMUSCULAR | Status: AC
  Filled 2014-12-09: qty 4

## 2014-12-09 MED ORDER — LIDOCAINE HCL (PF) 1 % IJ SOLN
INTRAMUSCULAR | Status: AC
  Filled 2014-12-09: qty 30

## 2014-12-09 MED ORDER — SODIUM CHLORIDE 0.9 % IV SOLN: 250.0000 mL | INTRAVENOUS | Status: DC | PRN

## 2014-12-09 MED ORDER — ASPIRIN 81 MG PO CHEW: 81.0000 mg | CHEWABLE_TABLET | ORAL | Status: DC

## 2014-12-09 MED ORDER — SODIUM CHLORIDE 0.9 % IJ SOLN: 3.0000 mL | INTRAMUSCULAR | Status: DC | PRN

## 2014-12-09 MED ORDER — IOHEXOL 350 MG/ML SOLN
INTRAVENOUS | Status: DC | PRN
  Administered 2014-12-09: 105 mL via INTRA_ARTERIAL

## 2014-12-09 MED ORDER — MIDAZOLAM HCL 2 MG/2ML IJ SOLN
INTRAMUSCULAR | Status: DC | PRN
  Administered 2014-12-09: 2 mg via INTRAVENOUS
  Administered 2014-12-09: 1 mg via INTRAVENOUS

## 2014-12-09 MED ORDER — FENTANYL CITRATE (PF) 100 MCG/2ML IJ SOLN
INTRAMUSCULAR | Status: AC
  Filled 2014-12-09: qty 4

## 2014-12-09 MED ORDER — SODIUM CHLORIDE 0.9 % IV SOLN: INTRAVENOUS | Status: DC

## 2014-12-09 MED ORDER — HEPARIN (PORCINE) IN NACL 2-0.9 UNIT/ML-% IJ SOLN
INTRAMUSCULAR | Status: AC
  Filled 2014-12-09: qty 1000

## 2014-12-09 MED ORDER — ACETAMINOPHEN 325 MG PO TABS: 650.0000 mg | ORAL_TABLET | ORAL | Status: DC | PRN

## 2014-12-09 MED ORDER — VERAPAMIL HCL 2.5 MG/ML IV SOLN
INTRA_ARTERIAL | Status: DC | PRN
  Administered 2014-12-09: 08:00:00 via INTRA_ARTERIAL

## 2014-12-09 MED ORDER — NITROGLYCERIN 1 MG/10 ML FOR IR/CATH LAB
INTRA_ARTERIAL | Status: AC
  Filled 2014-12-09: qty 10

## 2014-12-09 MED ORDER — ASPIRIN EC 81 MG PO TBEC
81.0000 mg | DELAYED_RELEASE_TABLET | Freq: Every day | ORAL | Status: DC
Start: 2014-12-10 — End: 2014-12-09
  Filled 2014-12-09: qty 1

## 2014-12-09 MED ORDER — NITROGLYCERIN 1 MG/10 ML FOR IR/CATH LAB
INTRA_ARTERIAL | Status: DC | PRN
  Administered 2014-12-09: 09:00:00

## 2014-12-09 MED ORDER — POTASSIUM CHLORIDE CRYS ER 20 MEQ PO TBCR
40.0000 meq | EXTENDED_RELEASE_TABLET | Freq: Once | ORAL | Status: AC
  Administered 2014-12-09: 40 meq via ORAL

## 2014-12-09 MED ORDER — HEPARIN SODIUM (PORCINE) 1000 UNIT/ML IJ SOLN
INTRAMUSCULAR | Status: DC | PRN
  Administered 2014-12-09: 4800 [IU] via INTRAVENOUS

## 2014-12-09 MED ORDER — ONDANSETRON HCL 4 MG/2ML IJ SOLN: 4.0000 mg | Freq: Four times a day (QID) | INTRAMUSCULAR | Status: DC | PRN

## 2014-12-09 MED ORDER — SODIUM CHLORIDE 0.9 % IV SOLN
INTRAVENOUS | Status: DC
  Administered 2014-12-09: 07:00:00 via INTRAVENOUS

## 2014-12-09 MED ORDER — DIAZEPAM 5 MG PO TABS
5.0000 mg | ORAL_TABLET | ORAL | Status: AC
  Administered 2014-12-09: 5 mg via ORAL

## 2014-12-09 MED ORDER — VERAPAMIL HCL 2.5 MG/ML IV SOLN
INTRAVENOUS | Status: AC
  Filled 2014-12-09: qty 2

## 2014-12-09 MED ORDER — FENTANYL CITRATE (PF) 100 MCG/2ML IJ SOLN
INTRAMUSCULAR | Status: DC | PRN
  Administered 2014-12-09 (×2): 25 ug via INTRAVENOUS

## 2014-12-09 MED ORDER — DIAZEPAM 5 MG PO TABS
ORAL_TABLET | ORAL | Status: AC
  Filled 2014-12-09: qty 1

## 2014-12-09 SURGICAL SUPPLY — 12 items
CATH INFINITI 5FR ANG PIGTAIL (CATHETERS) ×2 IMPLANT
CATH INFINITI JR4 5F (CATHETERS) ×2 IMPLANT
CATH OPTITORQUE TIG 4.0 5F (CATHETERS) ×2 IMPLANT
DEVICE RAD COMP TR BAND LRG (VASCULAR PRODUCTS) ×2 IMPLANT
GLIDESHEATH SLEND A-KIT 6F 22G (SHEATH) ×2 IMPLANT
KIT HEART LEFT (KITS) ×2 IMPLANT
PACK CARDIAC CATHETERIZATION (CUSTOM PROCEDURE TRAY) ×2 IMPLANT
SYR MEDRAD MARK V 150ML (SYRINGE) ×2 IMPLANT
TRANSDUCER W/STOPCOCK (MISCELLANEOUS) ×2 IMPLANT
TUBING CIL FLEX 10 FLL-RA (TUBING) ×2 IMPLANT
WIRE HI TORQ VERSACORE-J 145CM (WIRE) ×2 IMPLANT
WIRE SAFE-T 1.5MM-J .035X260CM (WIRE) ×2 IMPLANT

## 2014-12-09 NOTE — Interval H&P Note (Signed)
Cath Lab Visit (complete for each Cath Lab visit)  Clinical Evaluation Leading to the Procedure:   ACS: No.  Non-ACS:    Anginal Classification: CCS II  Anti-ischemic medical therapy: Maximal Therapy (2 or more classes of medications)  Non-Invasive Test Results: High-risk stress test findings: cardiac mortality >3%/year  Prior CABG: No previous CABG      History and Physical Interval Note:  12/09/2014 7:48 AM  Trevor Schmidt  has presented today for surgery, with the diagnosis of abnormal echo  The various methods of treatment have been discussed with the patient and family. After consideration of risks, benefits and other options for treatment, the patient has consented to  Procedure(s): Left Heart Cath and Coronary Angiography (N/A) as a surgical intervention .  The patient's history has been reviewed, patient examined, no change in status, stable for surgery.  I have reviewed the patient's chart and labs.  Questions were answered to the patient's satisfaction.     Sharla Tankard A

## 2014-12-09 NOTE — H&P (View-Only) (Signed)
Patient ID: Trevor Schmidt, male   DOB: 16-Feb-1940, 74 y.o.   MRN: [id number]     Primary: Trevor Po, FNP    HPI:  [name] recently establish cardiology care with me 6 weeks ago.  He presents for follow-up evaluation.    Trevor Schmidt has a history of hypertension, and previous diagnosis of mitral valve prolapse.  Remotely, he had seen Dr. Mare Ferrari.  In 2001 he had undergone a cardiac catheterization and was not told of having significant coronary blockage.  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  He recently moved back to the [place] area.  He has seen Dr. Ubaldo Glassing last year.  He has a history of obstructive sleep apnea and has been on CPAP therapy.  He received a new machine in February 2016 after his old machine malfunction.  A review of records from the [place] system reveals that there is also a history of remote PAF, hyperlipidemia, essential hypertension.  He denies any chest pain.  He is unaware of any recent cardiac arrhythmia.  He is using CPAP with 100% compliance.  He also has a history of hyperlipidemia and has been on atorvastatin.    When I initially saw him, I scheduled him for an echo Doppler study and nuclear stress test.  The echo Doppler study showed an ejection fraction of 30% with mild LVH and severe hypokinesis of the septal wall and inferior walls.  There was septal lateral dyssynergy.  There was grade 1 diastolic dysfunction.  There was no evidence for aortic valve stenosis, but there was mild aortic insufficiency.  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  He had mitral annular calcification with trivial Trevor, mild left atrial dilatation, and mild right atrial dilatation, he underwent a nuclear perfusion study which revealed concordant data with an ejection fraction of 34%.  He has underlying left bundle branch block.  A reversible septal and inferior perfusion defect was demonstrated.  He presents  for follow-up evaluation.  Past Medical History  Diagnosis Date  . Sleep apnea   . Dysrhythmia   . Hypertension   . MVP (mitral valve prolapse)   . GERD (gastroesophageal reflux disease)   . Arthritis   . HOH (hard of hearing)     aids  . Kidney stones   . Depression   . Edema     feet/ankles  . Confusion   . Prostate disease   . Cancer [facility])     bladder    Past Surgical History  Procedure Laterality Date  . Back surgery    . Knee arthroscopy    . 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 Bladder surgery      cancer  . Cystoscopy      litho x 2  . Eye surgery    . Wrist surgery      ctr  . Cataract extraction w/phaco Right 09/08/2014    Procedure: CATARACT EXTRACTION PHACO AND INTRAOCULAR LENS PLACEMENT (IOC);  Surgeon: Estill Cotta, MD;  Location: [facility] ORS;  Service: Ophthalmology;  Laterality: Right;  US:1:06.3 AP%:23.3 CDE:24.97 Fluid lot# [id number]    Allergies  Allergen Reactions  . Ceftriaxone Rash    Current Outpatient Prescriptions  Medication Sig Dispense Refill  . amLODipine (NORVASC) 10 MG tablet Take 10 mg by mouth daily.    Marland Kitchen amoxicillin (AMOXIL) 500 MG capsule Take 2,000 mg by mouth as needed (  dental procedures).     Marland Kitchen aspirin 325 MG EC tablet Take 325 mg by mouth daily.    Marland Kitchen atorvastatin (LIPITOR) 40 MG tablet Take 40 mg by mouth daily.    . calcium citrate (CALCITRATE - DOSED IN MG ELEMENTAL CALCIUM) 950 MG tablet Take 630 mg by mouth 2 (two) times daily.     . carvedilol (COREG) 3.125 MG tablet Take 1 tablet (3.125 mg total) by mouth 2 (two) times daily. 60 tablet 6  . citalopram (CELEXA) 40 MG tablet TAKE 1 TABLET (40 MG TOTAL) BY MOUTH DAILY. 30 tablet 3  . Cyanocobalamin (VITAMIN B-12 Schmidt) Take 100 mcg by mouth daily.    Marland Kitchen doxazosin (CARDURA) 2 MG tablet Take 2 mg by mouth daily.    . finasteride (PROSCAR) 5 MG tablet Take 5 mg by mouth daily.    . magnesium oxide (MAG-OX) 400 MG tablet Take 400 mg by mouth daily.    Marland Kitchen  olmesartan (BENICAR) 40 MG tablet Take 40 mg by mouth daily.    Marland Kitchen omeprazole (PRILOSEC) 20 MG capsule Take 20 mg by mouth daily.    . potassium chloride SA (K-DUR,KLOR-CON) 20 MEQ tablet Take 20 mEq by mouth 2 (two) times daily.     No current facility-administered medications for this visit.    Social History   Social History  . Marital Status: Married    Spouse Name: N/A  . Number of Children: 4  . Years of Education: 12   Occupational History  . Retired    Social History Main Topics  . Smoking status: Former Research scientist (life sciences)  . Smokeless tobacco: Current User    Types: Snuff  . Alcohol Use: No  . Drug Use: No  . Sexual Activity: Not on file   Other Topics Concern  . Not on file   Social History Narrative   Fun: Fish, woodworking   Additional social history is notable that he is married for 42 years.  His 4 children, 9 grandchildren, and 2 great-grandchildren.  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  He completed 12 grades years of education.  He had smoked for 30 years but quit tobacco in 2003.  There is no EtOH use.  He walks 3 days per week for 30 minutes at a time with a brisk walk.  Family History  Problem Relation Age of Onset  . Heart disease Mother   . Prostate cancer Father   . Healthy Maternal Grandmother   . Healthy Maternal Grandfather   . Healthy Paternal Grandmother   . Healthy Paternal Grandfather     ROS General: Negative; No fevers, chills, or night sweats HEENT: Negative; No changes in vision or hearing, sinus congestion, difficulty swallowing Pulmonary: Negative; No cough, wheezing, shortness of breath, hemoptysis Cardiovascular:  See HPI; No chest pain, presyncope, syncope, palpitations, edema GI: Negative; No nausea, vomiting, diarrhea, or abdominal pain GU: Remote history of bladder tumor. Musculoskeletal: Negative; no myalgias, joint pain, or weakness Hematologic/Oncologic: Negative; no easy bruising,  bleeding Endocrine: Negative; no heat/cold intolerance; no diabetes Neuro: Negative; no changes in balance, headaches Skin: Negative; No rashes or skin lesions Psychiatric: Negative; No behavioral problems, depression Sleep: Negative; No daytime sleepiness, hypersomnolence, bruxism, restless legs, hypnogagnic hallucinations Other comprehensive 14 point system review is negative   Physical Exam BP 140/78 mmHg  Pulse 62  Ht 5\' 11"  (1.803 m)  Wt 209 lb 8 oz (95.029 kg)  BMI 29.23 kg/m2  Wt Readings from Last 3  Encounters:  11/28/14 209 lb 8 oz (95.029 kg)  10/21/14 205 lb (92.987 kg)  10/14/14 205 lb 1.8 oz (93.037 kg)   General: Alert, oriented, no distress.  Skin: normal turgor, no rashes, warm and dry HEENT: Normocephalic, atraumatic. Pupils equal round and reactive to light; sclera anicteric; extraocular muscles intact; Fundi without hemorrhages or exudates.  Disks flat. Nose without nasal septal hypertrophy Mouth/Parynx benign; Mallinpatti scale 3 Neck: No JVD, no carotid bruits; normal carotid upstroke Lungs: clear to ausculatation and percussion; no wheezing or rales Chest wall: Positive pectus escavatum. Heart: PMI not displaced, RRR, s1 s2 normal, 1/6 systolic murmur, no diastolic murmur, no rubs, gallops, thrills, or heaves Abdomen: soft, nontender; no hepatosplenomehaly, BS+; abdominal aorta nontender and not dilated by palpation. Back: no CVA tenderness Pulses 2+ Musculoskeletal: full range of motion, normal strength, no joint deformities Extremities: no clubbing cyanosis or edema, Homan's sign negative  Neurologic: grossly nonfocal; Cranial nerves grossly wnl Psychologic: Normal mood and affect   10/14/2014 ECG (independently read by me): Sinus bradycardia 52 bpm per first-degree AV block with a PR interval of 212 ms.  Left bundle branch block with repolarization changes.  LABS:  No flowsheet data found.   No flowsheet data found.  No flowsheet data  found. No results found for: MCV No results found for: TSH No results found for: HGBA1C   BNP No results found for: BNP  ProBNP No results found for: PROBNP   Lipid Panel  No results found for: CHOL, TRIG, HDL, CHOLHDL, VLDL, LDLCALC, LDLDIRECT  RADIOLOGY: No results found.   ASSESSMENT AND PLAN: Trevor. Destyn Schuyler is a 74 year old white male who has a history of hypertension and has been treated with atenolol 50 mg daily, Cardura 2 mg, amlodipine 5 mg in addition to Benicar 40 mg. When I initially saw him, he was bradycardic and I reduced his atenolol down to 25 mg.  He had undergone cardiac catheterization prior to his brain aneurysm surgery with subsequent VP shunt over 10 years ago and was not told of having coronary blockage.  I reviewed his recent echo Doppler study and nuclear study.  This shows reduced LV function with an ejection fraction of approximately 30% and suggests ischemia in both the septal and inferior walls with associated wall motion abnormality.  It is my recommendation that he undergo definitive cardiac catheterization for further evaluation of this significant abnormality.  He denies any episodes of chest pain.  He denies any significant dyspnea.  Due to scheduling complex with his wife.  Next week.  The catheterization will be scheduled for the following week.  The risks and benefits of a cardiac catheterization including, but not limited to, death, stroke, MI, kidney damage and bleeding were discussed with the patient who indicates understanding and agrees to proceed.  Marland Kitchen  He also has a history of hyperlipidemia and is on atorvastatin    He is on CPAP for sleep apnea and recently received a new ResMed air since 10 CPAP machine.  He admits to 100% compliance.  I reviewed his recent download.  AHI is excellent at 2.1.  He is averaging over 7/2 hours of sleep per night.  At present, he will continue his current dose of Benicar 40 mg, Dr. Brynda Rim and 2 mg, carvedilol 3.125 mg  twice a day, and amlodipine 10 mg.  Laboratory will be obtained next week prior to his planned catheterization the following week.   Troy Sine, MD, [facility] 11/28/2014 12:59 PM

## 2014-12-09 NOTE — Discharge Instructions (Signed)
Radial Site Care °Refer to this sheet in the next few weeks. These instructions provide you with information about caring for yourself after your procedure. Your health care provider may also give you more specific instructions. Your treatment has been planned according to current medical practices, but problems sometimes occur. Call your health care provider if you have any problems or questions after your procedure. °WHAT TO EXPECT AFTER THE PROCEDURE °After your procedure, it is typical to have the following: °· Bruising at the radial site that usually fades within 1-2 weeks. °· Blood collecting in the tissue (hematoma) that may be painful to the touch. It should usually decrease in size and tenderness within 1-2 weeks. °HOME CARE INSTRUCTIONS °· Take medicines only as directed by your health care provider. °· You may shower 24-48 hours after the procedure or as directed by your health care provider. Remove the bandage (dressing) and gently wash the site with plain soap and water. Pat the area dry with a clean towel. Do not rub the site, because this may cause bleeding. °· Do not take baths, swim, or use a hot tub until your health care provider approves. °· Check your insertion site every day for redness, swelling, or drainage. °· Do not apply powder or lotion to the site. °· Do not flex or bend the affected arm for 24 hours or as directed by your health care provider. °· Do not push or pull heavy objects with the affected arm for 24 hours or as directed by your health care provider. °· Do not lift over 10 lb (4.5 kg) for 5 days after your procedure or as directed by your health care provider. °· Ask your health care provider when it is okay to: °¨ Return to work or school. °¨ Resume usual physical activities or sports. °¨ Resume sexual activity. °· Do not drive home if you are discharged the same day as the procedure. Have someone else drive you. °· You may drive 24 hours after the procedure unless otherwise  instructed by your health care provider. °· Do not operate machinery or power tools for 24 hours after the procedure. °· If your procedure was done as an outpatient procedure, which means that you went home the same day as your procedure, a responsible adult should be with you for the first 24 hours after you arrive home. °· Keep all follow-up visits as directed by your health care provider. This is important. °SEEK MEDICAL CARE IF: °· You have a fever. °· You have chills. °· You have increased bleeding from the radial site. Hold pressure on the site. °SEEK IMMEDIATE MEDICAL CARE IF: °· You have unusual pain at the radial site. °· You have redness, warmth, or swelling at the radial site. °· You have drainage (other than a small amount of blood on the dressing) from the radial site. °· The radial site is bleeding, and the bleeding does not stop after 30 minutes of holding steady pressure on the site. °· Your arm or hand becomes pale, cool, tingly, or numb. °  °This information is not intended to replace advice given to you by your health care provider. Make sure you discuss any questions you have with your health care provider. °  °Document Released: 02/19/2010 Document Revised: 02/07/2014 Document Reviewed: 08/05/2013 °Elsevier Interactive Patient Education ©2016 [employer] Inc[employer] ° °

## 2014-12-19 ENCOUNTER — Ambulatory Visit (INDEPENDENT_AMBULATORY_CARE_PROVIDER_SITE_OTHER): Payer: PPO | Admitting: Physician Assistant

## 2014-12-19 ENCOUNTER — Encounter: Payer: Self-pay | Admitting: Physician Assistant

## 2014-12-19 VITALS — BP 140/86 | HR 61 | Ht 70.0 in | Wt 212.2 lb

## 2014-12-19 DIAGNOSIS — I255 Ischemic cardiomyopathy: Secondary | ICD-10-CM

## 2014-12-19 DIAGNOSIS — G4733 Obstructive sleep apnea (adult) (pediatric): Secondary | ICD-10-CM

## 2014-12-19 DIAGNOSIS — I447 Left bundle-branch block, unspecified: Secondary | ICD-10-CM | POA: Diagnosis not present

## 2014-12-19 DIAGNOSIS — I2510 Atherosclerotic heart disease of native coronary artery without angina pectoris: Secondary | ICD-10-CM

## 2014-12-19 DIAGNOSIS — I10 Essential (primary) hypertension: Secondary | ICD-10-CM

## 2014-12-19 DIAGNOSIS — E785 Hyperlipidemia, unspecified: Secondary | ICD-10-CM | POA: Diagnosis not present

## 2014-12-19 DIAGNOSIS — Z9989 Dependence on other enabling machines and devices: Secondary | ICD-10-CM

## 2014-12-19 MED ORDER — CARVEDILOL 6.25 MG PO TABS: 6.2500 mg | ORAL_TABLET | Freq: Two times a day (BID) | ORAL | Status: DC

## 2014-12-19 NOTE — Patient Instructions (Signed)
Your physician recommends that you schedule a follow-up appointment Keep appt with Dr Claiborne Billings on 01/19/15 @ 8:45 am  Your physician has recommended you make the following change in your medication: Increase Carvedilol 6.25 mg twice a day

## 2014-12-19 NOTE — Progress Notes (Signed)
Patient ID: Trevor Schmidt, male   DOB: Jul 02, 1940, 74 y.o.   MRN: [id number]    Date:  12/19/2014   ID:  Trevor Schmidt, DOB [birth year], MRN [id number]  PCP:  [name], FNP   Primary Cardiologist: Claiborne Billings  Chief Complaint  Patient presents with  . Hospitalization Follow-up    Cath done, no stents placed     History of Present Illness: Trevor Schmidt is a 74 y.o. male a history of hypertension, and previous diagnosis of mitral valve prolapse. Remotely, he had seen Dr. Mare Ferrari. In 2001 he had undergone a cardiac catheterization and was not told of having significant coronary blockage. 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 He recently moved back to the [place] area. He has seen Dr. Ubaldo Glassing.  He has a history of obstructive sleep apnea and has been on CPAP therapy. He received a new machine in February 2016 after his old machine malfunction. A review of records from the [place] system reveals that there is also a history of remote PAF, hyperlipidemia, essential hypertension.  He is using CPAP with 100% compliance.   Echo Doppler study showed an ejection fraction of 30% with mild LVH and severe hypokinesis of the septal wall and inferior walls. There was septal lateral dyssynergy. There was grade 1 diastolic dysfunction. There was no evidence for aortic valve stenosis, but there was mild aortic insufficiency. 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 He had mitral annular calcification with trivial MR, mild left atrial dilatation, and mild right atrial dilatation, he underwent a nuclear perfusion study which revealed concordant data with an ejection fraction of 34%. He has underlying left bundle branch block. A reversible septal and inferior perfusion defect was demonstrated.   He underwent left heart catheterizationrevealing mid LAD lesion, 30% stenosed.  Dist RCA-1 lesion, 50% stenosed.  Dist RCA-2  lesion, 65% stenosed.  Posterior descending lesion, 100% stenosed.  His left right collaterals.  Patient presents for posthospital follow-up. He reports no trouble with the right radial cath site and reports no problems in general. He denies nausea, vomiting, fever, chest pain, shortness of breath, orthopnea, dizziness, PND, cough, congestion, abdominal pain, hematochezia, melena, lower extremity edema, claudication.  He can walk a mile around the block breathing through his nose without any difficulties.  The patient's wife is extremely skeptical that he is not truthful with his symptoms.    Wt Readings from Last 3 Encounters:  12/19/14 212 lb 3.2 oz (96.253 kg)  12/09/14 206 lb (93.441 kg)  11/28/14 209 lb 8 oz (95.029 kg)     Past Medical History  Diagnosis Date  . Sleep apnea   . Dysrhythmia   . Hypertension   . MVP (mitral valve prolapse)   . GERD (gastroesophageal reflux disease)   . Arthritis   . HOH (hard of hearing)     aids  . Kidney stones   . Depression   . Edema     feet/ankles  . Confusion   . Prostate disease   . Cancer [facility])     bladder    Current Outpatient Prescriptions  Medication Sig Dispense Refill  . amLODipine (NORVASC) 10 MG tablet Take 10 mg by mouth daily.    Marland Kitchen amoxicillin (AMOXIL) 500 MG capsule Take 2,000 mg by mouth as needed (dental procedures).     Marland Kitchen aspirin 325 MG EC tablet Take 325 mg by mouth daily.    Marland Kitchen atorvastatin (LIPITOR)  40 MG tablet Take 40 mg by mouth daily.    . calcium citrate (CALCITRATE - DOSED IN MG ELEMENTAL CALCIUM) 950 MG tablet Take 200 mg of elemental calcium by mouth 2 (two) times daily.     . carvedilol (COREG) 6.25 MG tablet Take 1 tablet (6.25 mg total) by mouth 2 (two) times daily. 60 tablet 6  . citalopram (CELEXA) 40 MG tablet TAKE 1 TABLET (40 MG TOTAL) BY MOUTH DAILY. 30 tablet 3  . Cyanocobalamin (VITAMIN B-12 PO) Take 100 mcg by mouth daily.    Marland Kitchen doxazosin (CARDURA) 2 MG tablet Take 2 mg by mouth daily.    .  finasteride (PROSCAR) 5 MG tablet Take 5 mg by mouth daily.    . magnesium oxide (MAG-OX) 400 MG tablet Take 400 mg by mouth daily.    Marland Kitchen olmesartan (BENICAR) 40 MG tablet Take 40 mg by mouth daily.    Marland Kitchen omeprazole (PRILOSEC) 20 MG capsule Take 20 mg by mouth daily.    . potassium chloride SA (K-DUR,KLOR-CON) 20 MEQ tablet Take 1 tablet (20 mEq total) by mouth 2 (two) times daily. 180 tablet 3   No current facility-administered medications for this visit.    Allergies:    Allergies  Allergen Reactions  . Ceftriaxone Rash    Social History:  The patient  reports that he has quit smoking. His smokeless tobacco use includes Snuff. He reports that he does not drink alcohol or use illicit drugs.   Family history:   Family History  Problem Relation Age of Onset  . Heart disease Mother   . Prostate cancer Father   . Healthy Maternal Grandmother   . Healthy Maternal Grandfather   . Healthy Paternal Grandmother   . Healthy Paternal Grandfather     ROS:  Please see the history of present illness.  All other systems reviewed and negative.   PHYSICAL EXAM: VS:  BP 140/86 mmHg  Pulse 61  Ht 5\' 10"  (1.778 m)  Wt 212 lb 3.2 oz (96.253 kg)  BMI 30.45 kg/m2 Well nourished, well developed, in no acute distress HEENT: Pupils are equal round react to light accommodation extraocular movements are intact.  Neck: no JVDNo cervical lymphadenopathy. Cardiac: Regular rate and rhythm without murmurs rubs or gallops. Lungs:  clear to auscultation bilaterally, no wheezing, rhonchi or rales Abd: soft, nontender, positive bowel sounds all quadrants, no hepatosplenomegaly Ext: no lower extremity edema.  2+ radial and dorsalis pedis pulses. Skin: warm and dry Neuro:  Grossly normal    ASSESSMENT AND PLAN:  Problem List Items Addressed This Visit    OSA on CPAP   LBBB (left bundle branch block)   Relevant Medications   carvedilol (COREG) 6.25 MG tablet   Hyperlipidemia LDL goal <70   Relevant  Medications   carvedilol (COREG) 6.25 MG tablet   Hyperlipidemia   Relevant Medications   carvedilol (COREG) 6.25 MG tablet   Essential hypertension - Primary   Relevant Medications   carvedilol (COREG) 6.25 MG tablet   Cardiomyopathy, ischemic   Relevant Medications   carvedilol (COREG) 6.25 MG tablet   CAD in native artery   Relevant Medications   carvedilol (COREG) 6.25 MG tablet     The patient presented for follow-up after his left heart catheterization which revealed a 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 He is not having angina symptoms and seems to have fairly decent exercise tolerance, although he is not exercising like  he was a year ago.  He is eager to get back in the gym.  He was switched from atenolol to Coreg which I have adjusted up from 3.125 mg to 6.25 mg twice daily. His blood pressures been running in the 140s and so hopefully this will help a little. Heart rates been upper 50s and 60s. I think he'll likely tolerate the adjustment well.  He appears euvolemic with no signs of acute heart failure.  He is on ARB. We did talk about daily weight monitoring and low-sodium diet.  Continue statin. He has obstructive sleep apnea and uses a CPAP.  Follow-up as scheduled in December

## 2015-01-19 ENCOUNTER — Encounter: Payer: Self-pay | Admitting: Cardiovascular Disease

## 2015-01-19 ENCOUNTER — Ambulatory Visit (INDEPENDENT_AMBULATORY_CARE_PROVIDER_SITE_OTHER): Payer: PPO | Admitting: Cardiovascular Disease

## 2015-01-19 VITALS — BP 108/60 | HR 56 | Ht 70.0 in | Wt 208.7 lb

## 2015-01-19 DIAGNOSIS — I255 Ischemic cardiomyopathy: Secondary | ICD-10-CM

## 2015-01-19 DIAGNOSIS — I10 Essential (primary) hypertension: Secondary | ICD-10-CM | POA: Diagnosis not present

## 2015-01-19 DIAGNOSIS — I2510 Atherosclerotic heart disease of native coronary artery without angina pectoris: Secondary | ICD-10-CM

## 2015-01-19 DIAGNOSIS — E785 Hyperlipidemia, unspecified: Secondary | ICD-10-CM

## 2015-01-19 DIAGNOSIS — G4733 Obstructive sleep apnea (adult) (pediatric): Secondary | ICD-10-CM

## 2015-01-19 DIAGNOSIS — N400 Enlarged prostate without lower urinary tract symptoms: Secondary | ICD-10-CM

## 2015-01-19 DIAGNOSIS — I25810 Atherosclerosis of coronary artery bypass graft(s) without angina pectoris: Secondary | ICD-10-CM | POA: Diagnosis not present

## 2015-01-19 DIAGNOSIS — I341 Nonrheumatic mitral (valve) prolapse: Secondary | ICD-10-CM | POA: Diagnosis not present

## 2015-01-19 DIAGNOSIS — I447 Left bundle-branch block, unspecified: Secondary | ICD-10-CM

## 2015-01-19 DIAGNOSIS — Z9989 Dependence on other enabling machines and devices: Secondary | ICD-10-CM

## 2015-01-19 MED ORDER — ZOLPIDEM TARTRATE 5 MG PO TABS: 5.0000 mg | ORAL_TABLET | Freq: Every evening | ORAL | Status: DC | PRN

## 2015-01-19 NOTE — Patient Instructions (Signed)
Your physician recommends that you return for lab work fasting.  Your physician wants you to follow-up in: 6 months or sooner if needed. You will receive a reminder letter in the mail two months in advance. If you don't receive a letter, please call our office to schedule the follow-up appointment. 

## 2015-01-19 NOTE — Progress Notes (Signed)
Patient ID: Trevor Schmidt, male   DOB: [birth year], 74 y.o.   MRN: [id number]     Primary: [name], FNP  HPI:  Trevor Schmidt recently establish cardiology care with me in September 2016.  He presents for follow-up evaluation following his recent cardiac catheterization..    Mr Trevor Schmidt has a history of hypertension, and previous diagnosis of mitral valve prolapse.  Remotely, he had seen Dr. Mare Ferrari.  In 2001 he had undergone a cardiac catheterization and was not told of having significant coronary blockage.  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  He recently moved back to the [place] area.  He has seen Dr. Ubaldo Glassing last year.  He has a history of obstructive sleep apnea and has been on CPAP therapy.  He received a new machine in February 2016 after his old machine malfunction.  A review of records from the [facility] system reveals that there is also a history of remote PAF, hyperlipidemia, essential hypertension.  He denies any chest pain.  He is unaware of any recent cardiac arrhythmia.  He is using CPAP with 100% compliance.  He also has a history of hyperlipidemia and has been on atorvastatin.    When I initially saw him, I scheduled him for an echo Doppler study and nuclear stress test.  The echo Doppler study showed an ejection fraction of 30% with mild LVH and severe hypokinesis of the septal wall and inferior walls.  There was septal lateral dyssynergy.  There was grade 1 diastolic dysfunction.  There was no evidence for aortic valve stenosis, but there was mild aortic insufficiency.  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  He had mitral annular calcification with trivial MR, mild left atrial dilatation, and mild right atrial dilatation, he underwent a nuclear perfusion study which revealed concordant data with an ejection fraction of 34%.  He has underlying left bundle branch block.  A reversible septal and inferior  perfusion defect was demonstrated.     since I last saw him, he underwent cardiac catheterization on 11/28/2014. This revealed CAD with a very short short left main, which immediately bifurcated into the LAD and left circumflex vessel. The LAD had 30% proximal stenosis immediately after the first diagonal branch. The circumflex vessel was a large caliber vessel, and there was extensive collateralization to the distal RCA branches. The RCA immediately gave rise to a conus branch and was tortuous. There was diffuse 50-60% stenoses proximal and medially beyond the acute margin prior to a large PDA vessel. The distal RCA after second small inferior LV branch was totally occluded. There were extensive collateralization to the distal RCA via the left injection.  He was felt that his inferior ischemia seen on the nuclear study was due to his distal RCA occlusion.  Medical therapy was recommended prefers ischemia as well as reduced LV function.   He saw Tenny Craw.  On 12/19/2014 in follow-up. At that time, he titrated his carvedilol to 6.25 mg twice a day.  He denies any recurrent anginal symptoms.  He continues to use  CPAP with 100% compliance and I had reviewed his download since he had received his new ResMed AirSense 10 CPAP machine.  He does have issues with sleep initiation.  He has remote history of bladder cancer and is followed by Dr. Karsten Ro.  He presents for reevaluation.    Past Medical History  Diagnosis Date  . Sleep apnea   .  Dysrhythmia   . Hypertension   . MVP (mitral valve prolapse)   . GERD (gastroesophageal reflux disease)   . Arthritis   . HOH (hard of hearing)     aids  . Kidney stones   . Depression   . Edema     feet/ankles  . Confusion   . Prostate disease   . Cancer [facility])     bladder    Past Surgical History  Procedure Laterality Date  . Back surgery    . Knee arthroscopy    . 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 Bladder surgery       cancer  . Cystoscopy      litho x 2  . Eye surgery    . Wrist surgery      ctr  . Cataract extraction w/phaco Right 09/08/2014    Procedure: CATARACT EXTRACTION PHACO AND INTRAOCULAR LENS PLACEMENT (IOC);  Surgeon: Estill Cotta, MD;  Location: [facility] ORS;  Service: Ophthalmology;  Laterality: Right;  US:1:06.3 AP%:23.3 CDE:24.97 Fluid lot# [id number]  . Cardiac catheterization N/A 12/09/2014    Procedure: Left Heart Cath and Coronary Angiography;  Surgeon: Troy Sine, MD;  Location: [facility];  Service: Cardiovascular;  Laterality: N/A;    Allergies  Allergen Reactions  . Ceftriaxone Rash    Current Outpatient Prescriptions  Medication Sig Dispense Refill  . amLODipine (NORVASC) 10 MG tablet Take 10 mg by mouth daily.    Marland Kitchen amoxicillin (AMOXIL) 500 MG capsule Take 2,000 mg by mouth as needed (dental procedures).     Marland Kitchen aspirin 325 MG EC tablet Take 325 mg by mouth daily.    Marland Kitchen atorvastatin (LIPITOR) 40 MG tablet Take 40 mg by mouth daily.    . calcium citrate (CALCITRATE - DOSED IN MG ELEMENTAL CALCIUM) 950 MG tablet Take 200 mg of elemental calcium by mouth 2 (two) times daily.     . carvedilol (COREG) 6.25 MG tablet Take 1 tablet (6.25 mg total) by mouth 2 (two) times daily. 60 tablet 6  . citalopram (CELEXA) 40 MG tablet TAKE 1 TABLET (40 MG TOTAL) BY MOUTH DAILY. 30 tablet 3  . Cyanocobalamin (VITAMIN B-12 PO) Take 100 mcg by mouth daily.    Marland Kitchen doxazosin (CARDURA) 2 MG tablet Take 2 mg by mouth daily.    . finasteride (PROSCAR) 5 MG tablet Take 5 mg by mouth daily.    . magnesium oxide (MAG-OX) 400 MG tablet Take 400 mg by mouth daily.    Marland Kitchen olmesartan (BENICAR) 40 MG tablet Take 40 mg by mouth daily.    Marland Kitchen omeprazole (PRILOSEC) 20 MG capsule Take 20 mg by mouth daily.    . potassium chloride SA (K-DUR,KLOR-CON) 20 MEQ tablet Take 1 tablet (20 mEq total) by mouth 2 (two) times daily. 180 tablet 3  . zolpidem (AMBIEN) 5 MG tablet Take 1 tablet (5 mg total) by mouth at  bedtime as needed for sleep. 30 tablet 0   No current facility-administered medications for this visit.    Social History   Social History  . Marital Status: Married    Spouse Name: N/A  . Number of Children: 4  . Years of Education: 12   Occupational History  . Retired    Social History Main Topics  . Smoking status: Former Research scientist (life sciences)  . Smokeless tobacco: Current User    Types: Snuff  . Alcohol Use: No  . Drug Use: No  . Sexual Activity:  Not on file   Other Topics Concern  . Not on file   Social History Narrative   Fun: Fish, woodworking   Additional social history is notable that he is married for 42 years.  His 4 children, 9 grandchildren, and 2 great-grandchildren.  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  He completed 12 grades years of education.  He had smoked for 30 years but quit tobacco in 2003.  There is no EtOH use.  He walks 3 days per week for 30 minutes at a time with a brisk walk.  Family History  Problem Relation Age of Onset  . Heart disease Mother   . Prostate cancer Father   . Healthy Maternal Grandmother   . Healthy Maternal Grandfather   . Healthy Paternal Grandmother   . Healthy Paternal Grandfather     ROS General: Negative; No fevers, chills, or night sweats HEENT: Negative; No changes in vision or hearing, sinus congestion, difficulty swallowing Pulmonary: Negative; No cough, wheezing, shortness of breath, hemoptysis Cardiovascular:  See HPI;  GI: Negative; No nausea, vomiting, diarrhea, or abdominal pain GU: Remote history of bladder tumor. Musculoskeletal: Negative; no myalgias, joint pain, or weakness Hematologic/Oncologic: Negative; no easy bruising, bleeding Endocrine: Negative; no heat/cold intolerance; no diabetes Neuro: Negative; no changes in balance, headaches Skin: Negative; No rashes or skin lesions Psychiatric: Negative; No behavioral problems, depression Sleep:  Positive for sleep apnea on CPAP ;  positive for difficulty with sleep initiation Other comprehensive 14 point system review is negative   Physical Exam BP 108/60 mmHg  Pulse 56  Ht 5\' 10"  (1.778 m)  Wt 208 lb 11.2 oz (94.666 kg)  BMI 29.95 kg/m2  Wt Readings from Last 3 Encounters:  01/19/15 208 lb 11.2 oz (94.666 kg)  12/19/14 212 lb 3.2 oz (96.253 kg)  12/09/14 206 lb (93.441 kg)   General: Alert, oriented, no distress.  Skin: normal turgor, no rashes, warm and dry HEENT: Normocephalic, atraumatic. Pupils equal round and reactive to light; sclera anicteric; extraocular muscles intact; Fundi without hemorrhages or exudates.  Disks flat. Nose without nasal septal hypertrophy Mouth/Parynx benign; Mallinpatti scale 3 Neck: No JVD, no carotid bruits; normal carotid upstroke Lungs: clear to ausculatation and percussion; no wheezing or rales Chest wall: Positive pectus escavatum. Heart: PMI not displaced, RRR, s1 s2 normal, 1/6 systolic murmur, no diastolic murmur, no rubs, gallops, thrills, or heaves Abdomen: soft, nontender; no hepatosplenomehaly, BS+; abdominal aorta nontender and not dilated by palpation. Back: no CVA tenderness Pulses 2+;  Right radial catheterization site stable Musculoskeletal: full range of motion, normal strength, no joint deformities Extremities: no clubbing cyanosis or edema, Homan's sign negative  Neurologic: grossly nonfocal; Cranial nerves grossly wnl Psychologic: Normal mood and affect  ECG (independently read by me):  Sinus bradycardia at 56 bpm with first-degree AV block with a PR interval at 216.  Left bundle branch block with repolarization changes.  10/14/2014 ECG (independently read by me): Sinus bradycardia 52 bpm per first-degree AV block with a PR interval of 212 ms.  Left bundle branch block with repolarization changes.  LABS:  BMP Latest Ref Rng 12/09/2014 12/02/2014  Glucose 65 - 99 mg/dL - 120(H)  BUN 7 - 25 mg/dL - 16  Creatinine 0.70 - 1.18 mg/dL - 0.81  Sodium 135 -  146 mmol/L - 142  Potassium 3.5 - 5.1 mmol/L 3.4(L) 3.4(L)  Chloride 98 - 110 mmol/L - 106  CO2 20 - 31 mmol/L - 26  Calcium 8.6 - 10.3 mg/dL - 8.6     No flowsheet data found.  CBC Latest Ref Rng 12/02/2014  WBC 4.0 - 10.5 K/uL 6.8  Hemoglobin 13.0 - 17.0 g/dL 13.7  Hematocrit 39.0 - 52.0 % 38.7(L)  Platelets 150 - 400 K/uL 178   Lab Results  Component Value Date   MCV 85.6 12/02/2014   No results found for: TSH No results found for: HGBA1C   BNP No results found for: BNP  ProBNP No results found for: PROBNP   Lipid Panel  No results found for: CHOL, TRIG, HDL, CHOLHDL, VLDL, LDLCALC, LDLDIRECT  RADIOLOGY: No results found.   ASSESSMENT AND PLAN: Mr. Elza is a 74 year old white male who has a history of hypertension and has been treated with atenolol 50 mg daily, Cardura 2 mg, amlodipine 5 mg in addition to Benicar 40 mg. When I initially saw him, he was bradycardic and I reduced his atenolol down to 25 mg.  He had undergone cardiac catheterization prior to his brain aneurysm surgery with subsequent VP shunt over 10 years ago and was not told of having coronary blockage.  His recent echo Doppler study demonstrated reduced LV function with an ejection fraction of approximately 30% and suggests ischemia in both the septal and inferior walls with associated wall motion abnormality.  I reviewed his catheterization study and details with he and his wife today.  He has total distal RCA occlusion with collaterals which account for ischemia seen on his nuclear imaging.  Medical therapy will be recommended for his concomitant CAD. His resting pulse is now on the 50s on his increased dose of carvedilol at 6.25 mg twice a day.  His blood pressure is stable and continues to be on Benicar 40 mg in addition to the carvedilol amlodipine 10 mg.  He also is on Cardura and Proscar for his prostate and with his history of bladder tumor he is followed by Dr. Karsten Ro.  I have recommended  complete set of fasting labs be obtained on his medical regimen.  Adjustments to his medicines will be made if necessary.  He continues to be on CPAP therapy with 100% compliance.  He is having issues with sleep initiation.  I a long discussion regarding optimal sleep hygiene.  I also have given him a prescription for zolpidem 5 mg to improve sleep initiation. I reduced his aspirin to 81 mg.  I will see him in 6 months for reevaluation or sooner if problems arise.   Troy Sine, MD, [facility] 01/19/2015 2:16 PM

## 2015-01-20 LAB — COMPREHENSIVE METABOLIC PANEL
ALK PHOS: 106 U/L (ref 40–115)
ALT: 22 U/L (ref 9–46)
AST: 18 U/L (ref 10–35)
Albumin: 4 g/dL (ref 3.6–5.1)
BUN: 13 mg/dL (ref 7–25)
CALCIUM: 8.6 mg/dL (ref 8.6–10.3)
CHLORIDE: 105 mmol/L (ref 98–110)
CO2: 27 mmol/L (ref 20–31)
Creat: 0.95 mg/dL (ref 0.70–1.18)
GLUCOSE: 102 mg/dL — AB (ref 65–99)
POTASSIUM: 3.6 mmol/L (ref 3.5–5.3)
Sodium: 141 mmol/L (ref 135–146)
Total Bilirubin: 0.9 mg/dL (ref 0.2–1.2)
Total Protein: 6.7 g/dL (ref 6.1–8.1)

## 2015-01-20 LAB — CBC
HEMATOCRIT: 39.9 % (ref 39.0–52.0)
Hemoglobin: 13.7 g/dL (ref 13.0–17.0)
MCH: 29.9 pg (ref 26.0–34.0)
MCHC: 34.3 g/dL (ref 30.0–36.0)
MCV: 87.1 fL (ref 78.0–100.0)
MPV: 10.7 fL (ref 8.6–12.4)
PLATELETS: 176 10*3/uL (ref 150–400)
RBC: 4.58 MIL/uL (ref 4.22–5.81)
RDW: 14.1 % (ref 11.5–15.5)
WBC: 7.6 10*3/uL (ref 4.0–10.5)

## 2015-01-20 LAB — LIPID PANEL
CHOL/HDL RATIO: 2.9 ratio (ref <=5.0)
Cholesterol: 100 mg/dL — ABNORMAL LOW (ref 125–200)
HDL: 34 mg/dL — AB (ref >=40)
LDL CALC: 42 mg/dL (ref <130)
Triglycerides: 119 mg/dL (ref <150)
VLDL: 24 mg/dL (ref <30)

## 2015-01-20 LAB — TSH: TSH: 2.828 u[IU]/mL (ref 0.350–4.500)

## 2015-02-04 ENCOUNTER — Encounter: Payer: Self-pay | Admitting: Family

## 2015-02-04 ENCOUNTER — Ambulatory Visit (INDEPENDENT_AMBULATORY_CARE_PROVIDER_SITE_OTHER): Payer: PPO | Admitting: Family

## 2015-02-04 VITALS — BP 128/78 | HR 58 | Temp 98.8°F | Resp 20 | Ht 70.0 in | Wt 207.8 lb

## 2015-02-04 DIAGNOSIS — J209 Acute bronchitis, unspecified: Secondary | ICD-10-CM

## 2015-02-04 HISTORY — DX: Acute bronchitis, unspecified: J20.9

## 2015-02-04 MED ORDER — AZITHROMYCIN 250 MG PO TABS: ORAL_TABLET | ORAL | Status: DC

## 2015-02-04 MED ORDER — BENZONATATE 100 MG PO CAPS: 100.0000 mg | ORAL_CAPSULE | Freq: Two times a day (BID) | ORAL | Status: DC | PRN

## 2015-02-04 MED ORDER — HYDROCOD POLST-CPM POLST ER 10-8 MG/5ML PO SUER: 5.0000 mL | Freq: Every evening | ORAL | Status: DC | PRN

## 2015-02-04 NOTE — Assessment & Plan Note (Signed)
Symptoms and exam consistent with bronchitis. Start azithromycin. Start Tussionex as needed for cough and sleep and Tessalon as needed for cough during the day. Sample of Breo provided. Continue over-the-counter medications as needed for symptom relief and supportive care. Follow-up if symptoms worsen or fail to improve.

## 2015-02-04 NOTE — Progress Notes (Signed)
Subjective:    Patient ID: Trevor Schmidt, male    DOB: [birth year], 75 y.o.   MRN: [id number]  Chief Complaint  Patient presents with  . Cough  . Nasal Congestion  . Abdominal Cramping    HPI:  Trevor Schmidt is a 74 y.o. male who  has a past medical history of Sleep apnea; Dysrhythmia; Hypertension; MVP (mitral valve prolapse); GERD (gastroesophageal reflux disease); Arthritis; HOH (hard of hearing); Kidney stones; Depression; Edema; Confusion; Prostate disease; and Cancer ([place]). and presents today for an acute office visit.  This is a new problem. Associated symptoms of cough and congestion for about 7 days. Modifying factors include guaifenesin, sudafed and Robitussin with no relief. T-max of 102 yesterday. Has not had a fever today. Denies recent antibiotic use. Cough has been severe enough to disturb his sleep pattern.   Allergies  Allergen Reactions  . Ceftriaxone Rash     Current Outpatient Prescriptions on File Prior to Visit  Medication Sig Dispense Refill  . amLODipine (NORVASC) 10 MG tablet Take 10 mg by mouth daily.    Marland Kitchen amoxicillin (AMOXIL) 500 MG capsule Take 2,000 mg by mouth as needed (dental procedures).     Marland Kitchen aspirin 325 MG EC tablet Take 325 mg by mouth daily.    Marland Kitchen atorvastatin (LIPITOR) 40 MG tablet Take 40 mg by mouth daily.    . calcium citrate (CALCITRATE - DOSED IN MG ELEMENTAL CALCIUM) 950 MG tablet Take 200 mg of elemental calcium by mouth 2 (two) times daily.     . carvedilol (COREG) 6.25 MG tablet Take 1 tablet (6.25 mg total) by mouth 2 (two) times daily. 60 tablet 6  . citalopram (CELEXA) 40 MG tablet TAKE 1 TABLET (40 MG TOTAL) BY MOUTH DAILY. 30 tablet 3  . Cyanocobalamin (VITAMIN B-12 PO) Take 100 mcg by mouth daily.    Marland Kitchen doxazosin (CARDURA) 2 MG tablet Take 2 mg by mouth daily.    . finasteride (PROSCAR) 5 MG tablet Take 5 mg by mouth daily.    . magnesium oxide (MAG-OX) 400 MG tablet Take 400 mg by mouth daily.    Marland Kitchen olmesartan (BENICAR) 40 MG  tablet Take 40 mg by mouth daily.    Marland Kitchen omeprazole (PRILOSEC) 20 MG capsule Take 20 mg by mouth daily.    . potassium chloride SA (K-DUR,KLOR-CON) 20 MEQ tablet Take 1 tablet (20 mEq total) by mouth 2 (two) times daily. 180 tablet 3  . zolpidem (AMBIEN) 5 MG tablet Take 1 tablet (5 mg total) by mouth at bedtime as needed for sleep. 30 tablet 0   No current facility-administered medications on file prior to visit.    Review of Systems  Constitutional: Positive for fever. Negative for chills.  HENT: Positive for congestion.   Respiratory: Negative for cough, chest tightness and shortness of breath.       Objective:    BP 128/78 mmHg  Pulse 58  Temp(Src) 98.8 F (37.1 C) (Oral)  Resp 20  Ht 5\' 10"  (1.778 m)  Wt 207 lb 12 oz (94.235 kg)  BMI 29.81 kg/m2  SpO2 95% Nursing note and vital signs reviewed.  Physical Exam  Constitutional: He is oriented to person, place, and time. He appears well-developed and well-nourished. No distress.  HENT:  Right Ear: Hearing, tympanic membrane, external ear and ear canal normal.  Left Ear: Hearing, tympanic membrane, external ear and ear canal normal.  Nose: Nose normal. Right sinus exhibits no maxillary sinus tenderness and no  frontal sinus tenderness. Left sinus exhibits no maxillary sinus tenderness and no frontal sinus tenderness.  Mouth/Throat: Uvula is midline, oropharynx is clear and moist and mucous membranes are normal.  Cardiovascular: Normal rate, regular rhythm, normal heart sounds and intact distal pulses.   Pulmonary/Chest: Effort normal and breath sounds normal.  Neurological: He is alert and oriented to person, place, and time.  Skin: Skin is warm and dry.  Psychiatric: He has a normal mood and affect. His behavior is normal. Judgment and thought content normal.       Assessment & Plan:   Problem List Items Addressed This Visit      Respiratory   Acute bronchitis - Primary    Symptoms and exam consistent with bronchitis.  Start azithromycin. Start Tussionex as needed for cough and sleep and Tessalon as needed for cough during the day. Sample of Breo provided. Continue over-the-counter medications as needed for symptom relief and supportive care. Follow-up if symptoms worsen or fail to improve.      Relevant Medications   benzonatate (TESSALON) 100 MG capsule   chlorpheniramine-HYDROcodone (TUSSIONEX PENNKINETIC ER) 10-8 MG/5ML SUER   azithromycin (ZITHROMAX) 250 MG tablet

## 2015-02-04 NOTE — Progress Notes (Signed)
Pre visit review using our clinic review tool, if applicable. No additional management support is needed unless otherwise documented below in the visit note. 

## 2015-02-04 NOTE — Patient Instructions (Signed)
Thank you for choosing [facility].  Summary/Instructions:  Your prescription(s) have been submitted to your pharmacy or been printed and provided for you. Please take as directed and contact our office if you believe you are having problem(s) with the medication(s) or have any questions.  If your symptoms worsen or fail to improve, please contact our office for further instruction, or in case of emergency go directly to the emergency room at the closest medical facility.   General Recommendations:    Please drink plenty of fluids.  Get plenty of rest   Sleep in humidified air  Use saline nasal sprays  Netti pot   OTC Medications:  Decongestants - helps relieve congestion   Flonase (generic fluticasone) or Nasacort (generic triamcinolone) - please make sure to use the "cross-over" technique at a 45 degree angle towards the opposite eye as opposed to straight up the nasal passageway.   Sudafed (generic pseudoephedrine - Note this is the one that is available behind the pharmacy counter); Products with phenylephrine (-PE) may also be used but is often not as effective as pseudoephedrine.   If you have HIGH BLOOD PRESSURE - Coricidin HBP; AVOID any product that is -D as this contains pseudoephedrine which may increase your blood pressure.  Afrin (oxymetazoline) every 6-8 hours for up to 3 days.   Allergies - helps relieve runny nose, itchy eyes and sneezing   Claritin (generic loratidine), Allegra (fexofenidine), or Zyrtec (generic cyrterizine) for runny nose. These medications should not cause drowsiness.  Note - Benadryl (generic diphenhydramine) may be used however may cause drowsiness  Cough -   Delsym or Robitussin (generic dextromethorphan)  Expectorants - helps loosen mucus to ease removal   Mucinex (generic guaifenesin) as directed on the package.  Headaches / General Aches   Tylenol (generic acetaminophen) - DO NOT EXCEED 3 grams (3,000 mg) in a 24  hour time period  Advil/Motrin (generic ibuprofen)   Sore Throat -   Salt water gargle   Chloraseptic (generic benzocaine) spray or lozenges / Sucrets (generic dyclonine)      

## 2015-02-18 DIAGNOSIS — G4733 Obstructive sleep apnea (adult) (pediatric): Secondary | ICD-10-CM | POA: Diagnosis not present

## 2015-03-11 ENCOUNTER — Other Ambulatory Visit: Payer: Self-pay | Admitting: Family

## 2015-03-11 NOTE — Telephone Encounter (Signed)
Patient's last OV was sept/2016--doesn't really look like depression was addressed---i can't find a wellness exam with you---please advise, thanks

## 2015-03-18 DIAGNOSIS — I129 Hypertensive chronic kidney disease with stage 1 through stage 4 chronic kidney disease, or unspecified chronic kidney disease: Secondary | ICD-10-CM | POA: Diagnosis not present

## 2015-03-18 DIAGNOSIS — I5022 Chronic systolic (congestive) heart failure: Secondary | ICD-10-CM | POA: Diagnosis not present

## 2015-03-18 DIAGNOSIS — G4700 Insomnia, unspecified: Secondary | ICD-10-CM | POA: Diagnosis not present

## 2015-03-18 DIAGNOSIS — F3341 Major depressive disorder, recurrent, in partial remission: Secondary | ICD-10-CM | POA: Diagnosis not present

## 2015-03-30 ENCOUNTER — Telehealth: Payer: Self-pay | Admitting: Cardiovascular Disease

## 2015-03-30 MED ORDER — DOXAZOSIN MESYLATE 2 MG PO TABS: 2.0000 mg | ORAL_TABLET | Freq: Every day | ORAL | Status: DC

## 2015-03-30 NOTE — Telephone Encounter (Signed)
Refill sent.

## 2015-03-30 NOTE — Telephone Encounter (Signed)
°*  STAT* If patient is at the pharmacy, call can be transferred to refill team.   1. Which medications need to be refilled? (please list name of each medication and dose if known) Doxazosin 2 mg-need new prescription-  2. Which pharmacy/location (including street and city if local pharmacy) is medication to be sent to?[facility] [phone]  3. Do they need a 30 day or 90 day supply? 90 and refills

## 2015-04-10 DIAGNOSIS — G4733 Obstructive sleep apnea (adult) (pediatric): Secondary | ICD-10-CM | POA: Diagnosis not present

## 2015-04-14 DIAGNOSIS — H353131 Nonexudative age-related macular degeneration, bilateral, early dry stage: Secondary | ICD-10-CM | POA: Diagnosis not present

## 2015-04-14 DIAGNOSIS — Z961 Presence of intraocular lens: Secondary | ICD-10-CM | POA: Diagnosis not present

## 2015-04-15 ENCOUNTER — Other Ambulatory Visit: Payer: Self-pay | Admitting: *Deleted

## 2015-04-15 MED ORDER — OLMESARTAN MEDOXOMIL 40 MG PO TABS: 40.0000 mg | ORAL_TABLET | Freq: Every day | ORAL | Status: DC

## 2015-04-15 NOTE — Telephone Encounter (Signed)
REFILL 

## 2015-04-28 DIAGNOSIS — N400 Enlarged prostate without lower urinary tract symptoms: Secondary | ICD-10-CM | POA: Diagnosis not present

## 2015-05-07 ENCOUNTER — Other Ambulatory Visit: Payer: Self-pay | Admitting: *Deleted

## 2015-05-07 MED ORDER — AMLODIPINE BESYLATE 10 MG PO TABS: 10.0000 mg | ORAL_TABLET | Freq: Every day | ORAL | Status: DC

## 2015-05-07 NOTE — Telephone Encounter (Signed)
Rx request sent to pharmacy.  

## 2015-06-23 ENCOUNTER — Telehealth: Payer: Self-pay | Admitting: Cardiovascular Disease

## 2015-06-23 NOTE — Telephone Encounter (Signed)
Returned call to pt's wife, ok per DPR.  She and pt did not want to wait until September to see Dr Claiborne Billings and requested a PA until they could get in with Dr Claiborne Billings. Appt was scheduled with Kerin Ransom.

## 2015-06-23 NOTE — Telephone Encounter (Signed)
Pt's wife calling to make June appt-just got letter in the mail today-Dr. Claiborne Billings is booked until September and pt's wife has concerns re his condition and meds to wait or see a PA-pls advise

## 2015-07-13 ENCOUNTER — Encounter: Payer: Self-pay | Admitting: Cardiology

## 2015-07-13 ENCOUNTER — Ambulatory Visit (INDEPENDENT_AMBULATORY_CARE_PROVIDER_SITE_OTHER): Payer: PPO | Admitting: Cardiology

## 2015-07-13 VITALS — BP 117/62 | HR 60 | Ht 71.0 in | Wt 202.4 lb

## 2015-07-13 DIAGNOSIS — I119 Hypertensive heart disease without heart failure: Secondary | ICD-10-CM

## 2015-07-13 DIAGNOSIS — I428 Other cardiomyopathies: Secondary | ICD-10-CM

## 2015-07-13 DIAGNOSIS — E785 Hyperlipidemia, unspecified: Secondary | ICD-10-CM | POA: Diagnosis not present

## 2015-07-13 DIAGNOSIS — I429 Cardiomyopathy, unspecified: Secondary | ICD-10-CM | POA: Diagnosis not present

## 2015-07-13 DIAGNOSIS — I5022 Chronic systolic (congestive) heart failure: Secondary | ICD-10-CM | POA: Diagnosis not present

## 2015-07-13 DIAGNOSIS — I2510 Atherosclerotic heart disease of native coronary artery without angina pectoris: Secondary | ICD-10-CM | POA: Diagnosis not present

## 2015-07-13 DIAGNOSIS — I255 Ischemic cardiomyopathy: Secondary | ICD-10-CM

## 2015-07-13 DIAGNOSIS — I447 Left bundle-branch block, unspecified: Secondary | ICD-10-CM

## 2015-07-13 NOTE — Assessment & Plan Note (Signed)
On statin Rx, lipids favorable in Dec 2016

## 2015-07-13 NOTE — Assessment & Plan Note (Signed)
Compliant 

## 2015-07-13 NOTE — Assessment & Plan Note (Signed)
Moderate CAD Nov 2016 after abnormal Myoview- 30% LAD, 50-60% mid RCA with total distal RCA

## 2015-07-13 NOTE — Assessment & Plan Note (Signed)
LVH on echo

## 2015-07-13 NOTE — Patient Instructions (Signed)
Medication Instructions: Kerin Ransom, PA-C, recommends that you continue on your current medications as directed. Please refer to the Current Medication list given to you today.  Labwork: NONE ORDERED  Testing/Procedures: 1. Echocardiogram - Your physician has requested that you have an echocardiogram. Echocardiography is a painless test that uses sound waves to create images of your heart. It provides your doctor with information about the size and shape of your heart and how well your heart's chambers and valves are working. This procedure takes approximately one hour. There are no restrictions for this procedure. This will be performed at our [facility] location - [address], [address].  Follow-up: Lurena Joiner recommends that you schedule a follow-up appointment in 6 months with Dr Claiborne Billings. You will receive a reminder letter in the mail two months in advance. If you don't receive a letter, please call our office to schedule the follow-up appointment.  If you need a refill on your cardiac medications before your next appointment, please call your pharmacy.

## 2015-07-13 NOTE — Progress Notes (Signed)
07/13/2015 Trevor Schmidt   1940/08/09  [id number]  Primary Physician [name], FNP Primary Cardiologist: Dr Claiborne Billings  HPI:  75 y/o previously followed by Dr Mare Ferrari, now Dr Claiborne Billings.  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 Cath in Nov 2016 after abnormal Myoview showed 30% LAD and total distal RCA with collaterals. Echo showed septal HK. He does have a LBBB. He has not been troubled by CHF or angina. Dr Claiborne Billings adjusted his medications in Sept. The pt is in the office today for a 6 month follow up. He continues to do well, denying angina or CHF. He walks a mile a day without problems.    Current Outpatient Prescriptions  Medication Sig Dispense Refill  . amLODipine (NORVASC) 10 MG tablet Take 1 tablet (10 mg total) by mouth daily. 30 tablet 2  . amoxicillin (AMOXIL) 500 MG capsule Take 2,000 mg by mouth as needed (dental procedures).     Marland Kitchen aspirin 81 MG tablet Take 81 mg by mouth daily.    Marland Kitchen atorvastatin (LIPITOR) 40 MG tablet Take 40 mg by mouth daily.    . busPIRone (BUSPAR) 10 MG tablet Take 1 tablet by mouth 2 (two) times daily.    . calcium citrate (CALCITRATE - DOSED IN MG ELEMENTAL CALCIUM) 950 MG tablet Take 200 mg of elemental calcium by mouth 2 (two) times daily.     . carvedilol (COREG) 6.25 MG tablet Take 1 tablet (6.25 mg total) by mouth 2 (two) times daily. 60 tablet 6  . citalopram (CELEXA) 40 MG tablet TAKE 1 TABLET (40 MG TOTAL) BY MOUTH DAILY. 30 tablet 0  . Cyanocobalamin (VITAMIN B-12 PO) Take 100 mcg by mouth daily.    Marland Kitchen doxazosin (CARDURA) 2 MG tablet Take 1 tablet (2 mg total) by mouth daily. 90 tablet 1  . finasteride (PROSCAR) 5 MG tablet Take 5 mg by mouth daily.    . magnesium oxide (MAG-OX) 400 MG tablet Take 400 mg by mouth daily.    Marland Kitchen olmesartan (BENICAR) 40 MG tablet Take 1 tablet (40 mg total) by mouth daily. 30 tablet 5  . omeprazole (PRILOSEC) 20 MG capsule Take 20 mg by mouth daily.    .  potassium chloride SA (K-DUR,KLOR-CON) 20 MEQ tablet Take 1 tablet (20 mEq total) by mouth 2 (two) times daily. 180 tablet 3  . zolpidem (AMBIEN) 5 MG tablet Take 1 tablet (5 mg total) by mouth at bedtime as needed for sleep. 30 tablet 0   No current facility-administered medications for this visit.    Allergies  Allergen Reactions  . Ceftriaxone Rash    Social History   Social History  . Marital Status: Married    Spouse Name: N/A  . Number of Children: 4  . Years of Education: 12   Occupational History  . Retired    Social History Main Topics  . Smoking status: Former Research scientist (life sciences)  . Smokeless tobacco: Current User    Types: Snuff  . Alcohol Use: No  . Drug Use: No  . Sexual Activity: Not on file   Other Topics Concern  . Not on file   Social History Narrative   Fun: Fish, woodworking     Review of Systems: General: negative for chills, fever, night sweats or weight changes.  Cardiovascular: negative for chest pain, dyspnea on exertion, edema, orthopnea, palpitations, paroxysmal nocturnal dyspnea or shortness of breath Dermatological: negative for rash Respiratory: negative  for cough or wheezing Urologic: negative for hematuria Abdominal: negative for nausea, vomiting, diarrhea, bright red blood per rectum, melena, or hematemesis Neurologic: negative for visual changes, syncope, or dizziness All other systems reviewed and are otherwise negative except as noted above.    Blood pressure 117/62, pulse 60, height 5\' 11"  (1.803 m), weight 202 lb 6 oz (91.797 kg).  General appearance: alert, cooperative and no distress Neck: no carotid bruit and no JVD Lungs: clear to auscultation bilaterally Heart: regular rate and rhythm Extremities: no edema Skin: Skin color, texture, turgor normal. 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 He does apparently have an ischemic cardiomyopathy and if his EF is less than 35% he would be a candidate for an ICD.   Kerin Ransom PA-C 07/13/2015 8:49 AM

## 2015-07-13 NOTE — Assessment & Plan Note (Signed)
EF 30% by echo Sept 2016 No CHF on exam

## 2015-07-13 NOTE — Assessment & Plan Note (Signed)
chronic

## 2015-07-29 ENCOUNTER — Other Ambulatory Visit: Payer: Self-pay

## 2015-07-29 MED ORDER — CARVEDILOL 6.25 MG PO TABS: 6.2500 mg | ORAL_TABLET | Freq: Two times a day (BID) | ORAL | Status: DC

## 2015-07-30 ENCOUNTER — Ambulatory Visit (HOSPITAL_COMMUNITY): Payer: PPO | Attending: Cardiology

## 2015-07-30 ENCOUNTER — Other Ambulatory Visit: Payer: Self-pay

## 2015-07-30 DIAGNOSIS — I517 Cardiomegaly: Secondary | ICD-10-CM | POA: Diagnosis not present

## 2015-07-30 DIAGNOSIS — I5022 Chronic systolic (congestive) heart failure: Secondary | ICD-10-CM

## 2015-07-30 DIAGNOSIS — I255 Ischemic cardiomyopathy: Secondary | ICD-10-CM | POA: Diagnosis not present

## 2015-07-30 DIAGNOSIS — I77810 Thoracic aortic ectasia: Secondary | ICD-10-CM | POA: Insufficient documentation

## 2015-07-30 DIAGNOSIS — I351 Nonrheumatic aortic (valve) insufficiency: Secondary | ICD-10-CM | POA: Insufficient documentation

## 2015-07-30 DIAGNOSIS — I340 Nonrheumatic mitral (valve) insufficiency: Secondary | ICD-10-CM | POA: Diagnosis not present

## 2015-07-30 DIAGNOSIS — G4733 Obstructive sleep apnea (adult) (pediatric): Secondary | ICD-10-CM | POA: Diagnosis not present

## 2015-07-30 LAB — ECHOCARDIOGRAM COMPLETE
E decel time: 349 msec
E/e' ratio: 19.5
FS: 15 % — AB (ref 28–44)
IVS/LV PW RATIO, ED: 0.82
LA ID, A-P, ES: 38 mm
LA diam end sys: 38 mm
LA diam index: 1.79 cm/m2
LA vol A4C: 63 ml
LA vol index: 35.4 mL/m2
LA vol: 75 mL
LV E/e' medial: 19.5
LV E/e'average: 19.5
LV PW d: 15 mm — AB (ref 0.6–1.1)
LV dias vol index: 96 mL/m2
LV dias vol: 204 mL — AB (ref 62–150)
LV e' LATERAL: 3.62 cm/s
LV sys vol index: 52 mL/m2
LV sys vol: 110 mL — AB
LVOT SV: 91 mL
LVOT VTI: 24 cm
LVOT area: 3.8 cm2
LVOT diameter: 22 mm
LVOT peak vel: 106 cm/s
MV Dec: 349
MV pk A vel: 92.8 m/s
MV pk E vel: 70.6 m/s
P 1/2 time: 699 ms
Reg peak vel: 180 cm/s
Simpson's disk: 46
Stroke v: 94 ml
TDI e' lateral: 3.62
TDI e' medial: 3.51
TR max vel: 180 cm/s

## 2015-08-03 ENCOUNTER — Telehealth: Payer: Self-pay | Admitting: *Deleted

## 2015-08-03 NOTE — Telephone Encounter (Signed)
pt aware of results  

## 2015-08-03 NOTE — Telephone Encounter (Signed)
-----   Message from Erlene Quan, [place] sent at 07/30/2015  3:09 PM EDT ----- Please let pt know his echo shows his heart function is still depressed-35%- but not bad enough to need an ICD. F/U Dr Jaclyn Shaggy PA-C 07/30/2015 3:10 PM

## 2015-08-09 ENCOUNTER — Other Ambulatory Visit: Payer: Self-pay | Admitting: Cardiovascular Disease

## 2015-08-24 DIAGNOSIS — Z0000 Encounter for general adult medical examination without abnormal findings: Secondary | ICD-10-CM | POA: Diagnosis not present

## 2015-08-24 DIAGNOSIS — I5022 Chronic systolic (congestive) heart failure: Secondary | ICD-10-CM | POA: Diagnosis not present

## 2015-08-24 DIAGNOSIS — E538 Deficiency of other specified B group vitamins: Secondary | ICD-10-CM | POA: Diagnosis not present

## 2015-08-24 DIAGNOSIS — E785 Hyperlipidemia, unspecified: Secondary | ICD-10-CM | POA: Diagnosis not present

## 2015-08-24 DIAGNOSIS — E663 Overweight: Secondary | ICD-10-CM | POA: Diagnosis not present

## 2015-08-24 DIAGNOSIS — I129 Hypertensive chronic kidney disease with stage 1 through stage 4 chronic kidney disease, or unspecified chronic kidney disease: Secondary | ICD-10-CM | POA: Diagnosis not present

## 2015-08-31 DIAGNOSIS — E785 Hyperlipidemia, unspecified: Secondary | ICD-10-CM | POA: Diagnosis not present

## 2015-08-31 DIAGNOSIS — F17290 Nicotine dependence, other tobacco product, uncomplicated: Secondary | ICD-10-CM | POA: Diagnosis not present

## 2015-08-31 DIAGNOSIS — F339 Major depressive disorder, recurrent, unspecified: Secondary | ICD-10-CM | POA: Diagnosis not present

## 2015-08-31 DIAGNOSIS — I129 Hypertensive chronic kidney disease with stage 1 through stage 4 chronic kidney disease, or unspecified chronic kidney disease: Secondary | ICD-10-CM | POA: Diagnosis not present

## 2015-08-31 DIAGNOSIS — I5022 Chronic systolic (congestive) heart failure: Secondary | ICD-10-CM | POA: Diagnosis not present

## 2015-09-25 ENCOUNTER — Other Ambulatory Visit: Payer: Self-pay | Admitting: Cardiovascular Disease

## 2015-09-28 NOTE — Telephone Encounter (Signed)
Rx request sent to pharmacy.  

## 2015-10-12 ENCOUNTER — Other Ambulatory Visit: Payer: Self-pay | Admitting: Cardiovascular Disease

## 2015-10-13 DIAGNOSIS — H26491 Other secondary cataract, right eye: Secondary | ICD-10-CM | POA: Diagnosis not present

## 2015-11-02 DIAGNOSIS — Z8551 Personal history of malignant neoplasm of bladder: Secondary | ICD-10-CM | POA: Diagnosis not present

## 2015-11-04 ENCOUNTER — Other Ambulatory Visit: Payer: Self-pay | Admitting: Cardiovascular Disease

## 2016-01-05 ENCOUNTER — Ambulatory Visit (INDEPENDENT_AMBULATORY_CARE_PROVIDER_SITE_OTHER): Payer: PPO | Admitting: Cardiovascular Disease

## 2016-01-05 ENCOUNTER — Encounter: Payer: Self-pay | Admitting: Cardiovascular Disease

## 2016-01-05 VITALS — BP 146/71 | HR 53 | Ht 70.0 in | Wt 202.0 lb

## 2016-01-05 DIAGNOSIS — I447 Left bundle-branch block, unspecified: Secondary | ICD-10-CM

## 2016-01-05 DIAGNOSIS — I5022 Chronic systolic (congestive) heart failure: Secondary | ICD-10-CM

## 2016-01-05 DIAGNOSIS — E785 Hyperlipidemia, unspecified: Secondary | ICD-10-CM

## 2016-01-05 DIAGNOSIS — I2510 Atherosclerotic heart disease of native coronary artery without angina pectoris: Secondary | ICD-10-CM | POA: Diagnosis not present

## 2016-01-05 DIAGNOSIS — I255 Ischemic cardiomyopathy: Secondary | ICD-10-CM | POA: Diagnosis not present

## 2016-01-05 DIAGNOSIS — I10 Essential (primary) hypertension: Secondary | ICD-10-CM | POA: Diagnosis not present

## 2016-01-05 DIAGNOSIS — Z79899 Other long term (current) drug therapy: Secondary | ICD-10-CM | POA: Diagnosis not present

## 2016-01-05 DIAGNOSIS — E7800 Pure hypercholesterolemia, unspecified: Secondary | ICD-10-CM

## 2016-01-05 NOTE — Patient Instructions (Signed)
Your physician recommends that you return for lab work in: [place].   Your physician wants you to follow-up in: 6 months or sooner if needed. You will receive a reminder letter in the mail two months in advance. If you don't receive a letter, please call our office to schedule the follow-up appointment.  If you need a refill on your cardiac medications before your next appointment, please call your pharmacy.

## 2016-01-06 LAB — COMPREHENSIVE METABOLIC PANEL
ALK PHOS: 93 U/L (ref 40–115)
ALT: 16 U/L (ref 9–46)
AST: 17 U/L (ref 10–35)
Albumin: 3.9 g/dL (ref 3.6–5.1)
BILIRUBIN TOTAL: 0.9 mg/dL (ref 0.2–1.2)
BUN: 13 mg/dL (ref 7–25)
CO2: 27 mmol/L (ref 20–31)
Calcium: 8.7 mg/dL (ref 8.6–10.3)
Chloride: 105 mmol/L (ref 98–110)
Creat: 0.84 mg/dL (ref 0.70–1.18)
GLUCOSE: 94 mg/dL (ref 65–99)
Potassium: 3.5 mmol/L (ref 3.5–5.3)
Sodium: 143 mmol/L (ref 135–146)
TOTAL PROTEIN: 6.4 g/dL (ref 6.1–8.1)

## 2016-01-06 LAB — CBC
HEMATOCRIT: 40 % (ref 38.5–50.0)
Hemoglobin: 13.3 g/dL (ref 13.2–17.1)
MCH: 29.7 pg (ref 27.0–33.0)
MCHC: 33.3 g/dL (ref 32.0–36.0)
MCV: 89.3 fL (ref 80.0–100.0)
MPV: 10.7 fL (ref 7.5–12.5)
PLATELETS: 144 10*3/uL (ref 140–400)
RBC: 4.48 MIL/uL (ref 4.20–5.80)
RDW: 14.6 % (ref 11.0–15.0)
WBC: 5.9 10*3/uL (ref 3.8–10.8)

## 2016-01-06 LAB — LIPID PANEL
CHOLESTEROL: 98 mg/dL (ref <200)
HDL: 44 mg/dL (ref >40)
LDL Cholesterol: 38 mg/dL (ref <100)
Total CHOL/HDL Ratio: 2.2 Ratio (ref <5.0)
Triglycerides: 79 mg/dL (ref <150)
VLDL: 16 mg/dL (ref <30)

## 2016-01-06 LAB — TSH: TSH: 2.79 m[IU]/L (ref 0.40–4.50)

## 2016-01-06 NOTE — Progress Notes (Signed)
Patient ID: Trevor Schmidt, male   DOB: 1940/06/12, 75 y.o.   MRN: [id number]     Primary: [name], FNP  HPI:  Trevor Schmidt recently establish cardiology care with me in September 2016.  He presents for a one year follow-up evaluation.  Trevor Schmidt has a history of hypertension, and previous diagnosis of mitral valve prolapse.  Remotely, he had seen Dr. Mare Ferrari.  In 2001 he had undergone a cardiac catheterization and was not told of having significant coronary blockage.  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  He recently moved back to the [place] area.  He has seen Dr. Ubaldo Glassing last year.  He has a history of obstructive sleep apnea and has been on CPAP therapy.  He received a new machine in February 2016 after his old machine malfunction.  A review of records from the [facility] system reveals that there is also a history of remote PAF, hyperlipidemia, essential hypertension.  He denies any chest pain.  He is unaware of any recent cardiac arrhythmia.  He is using CPAP with 100% compliance.  He also has a history of hyperlipidemia and has been on atorvastatin.    When I initially saw him, I scheduled him for an echo Doppler study and nuclear stress test.  The echo Doppler study showed an ejection fraction of 30% with mild LVH and severe hypokinesis of the septal wall and inferior walls.  There was septal lateral dyssynergy.  There was grade 1 diastolic dysfunction.  There was no evidence for aortic valve stenosis, but there was mild aortic insufficiency.  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  He had mitral annular calcification with trivial Trevor, mild left atrial dilatation, and mild right atrial dilatation, he underwent a nuclear perfusion study which revealed concordant data with an ejection fraction of 34%.  He has underlying left bundle branch block.  A reversible septal and inferior perfusion defect was demonstrated.      He underwent cardiac catheterization on 11/28/2014 which revealed CAD with a very short short left main, which immediately bifurcated into the LAD and left circumflex vessel. The LAD had 30% proximal stenosis immediately after the first diagonal branch. The circumflex vessel was a large caliber vessel, and there was extensive collateralization to the distal RCA branches. The RCA immediately gave rise to a conus branch and was tortuous. There was diffuse 50-60% stenoses proximal and medially beyond the acute margin prior to a large PDA vessel. The distal RCA after second small inferior LV branch was totally occluded. There were extensive collateralization to the distal RCA via the left injection.  He was felt that his inferior ischemia seen on the nuclear study was due to his distal RCA occlusion.  Medical therapy was recommended.  Since I last saw him one year ago, he has been without chest pain. He has remained active doing yard work and walking.  He remains asymptomatic In June 2017 he underwent an echo Doppler study which showed an EF of 35% with Gr I DD.   His aortic root was mildly dioated at 40 mm.  He denies any recurrent anginal symptoms.  He continues to use  CPAP with 100% compliance and  since he has a fairly new ResMed AirSense 10 CPAP machine.  He has remote history of bladder cancer and is followed by Dr. Karsten Ro.  He presents for reevaluation.    Past Medical History:  Diagnosis Date  .  Arthritis   . Cancer ([place])    bladder  . Confusion   . Depression   . Dysrhythmia   . Edema    feet/ankles  . GERD (gastroesophageal reflux disease)   . HOH (hard of hearing)    aids  . Hypertension   . Kidney stones   . MVP (mitral valve prolapse)   . Prostate disease   . Sleep apnea     Past Surgical History:  Procedure Laterality Date  . BACK SURGERY    . BLADDER SURGERY     cancer  . CARDIAC CATHETERIZATION N/A 12/09/2014   Procedure: Left Heart Cath and Coronary  Angiography;  Surgeon: Troy Sine, MD;  Location: [facility];  Service: Cardiovascular;  Laterality: N/A;  . CATARACT EXTRACTION W/PHACO Right 09/08/2014   Procedure: CATARACT EXTRACTION PHACO AND INTRAOCULAR LENS PLACEMENT (IOC);  Surgeon: Estill Cotta, MD;  Location: [facility];  Service: Ophthalmology;  Laterality: Right;  US:1:06.3 AP%:23.3 CDE:24.97 Fluid lot# [id number]  . CEREBRAL ANEURYSM REPAIR     coil and shunt placement  . CYSTOSCOPY     litho x 2  . EYE SURGERY    . KNEE ARTHROSCOPY    . WRIST SURGERY     ctr    Allergies  Allergen Reactions  . Ceftriaxone Rash    Current Outpatient Prescriptions  Medication Sig Dispense Refill  . amLODipine (NORVASC) 10 MG tablet TAKE 1 TABLET (10 MG TOTAL) BY MOUTH DAILY. 90 tablet 3  . amoxicillin (AMOXIL) 500 MG capsule Take 2,000 mg by mouth as needed (dental procedures).     Marland Kitchen aspirin 81 MG tablet Take 81 mg by mouth daily.    Marland Kitchen atorvastatin (LIPITOR) 40 MG tablet Take 40 mg by mouth daily.    . calcium citrate (CALCITRATE - DOSED IN MG ELEMENTAL CALCIUM) 950 MG tablet Take 200 mg of elemental calcium by mouth 2 (two) times daily.     . carvedilol (COREG) 6.25 MG tablet Take 1 tablet (6.25 mg total) by mouth 2 (two) times daily. 60 tablet 6  . citalopram (CELEXA) 40 MG tablet TAKE 1 TABLET (40 MG TOTAL) BY MOUTH DAILY. 30 tablet 0  . Cyanocobalamin (VITAMIN B-12 PO) Take 100 mcg by mouth daily.    Marland Kitchen doxazosin (CARDURA) 2 MG tablet TAKE 1 TABLET (2 MG TOTAL) BY MOUTH DAILY. 90 tablet 3  . finasteride (PROSCAR) 5 MG tablet Take 5 mg by mouth daily.    . magnesium oxide (MAG-OX) 400 MG tablet Take 400 mg by mouth daily.    Marland Kitchen olmesartan (BENICAR) 40 MG tablet TAKE 1 TABLET (40 MG TOTAL) BY MOUTH DAILY. 30 tablet 11  . omeprazole (PRILOSEC) 20 MG capsule Take 20 mg by mouth daily.    . potassium chloride SA (K-DUR,KLOR-CON) 20 MEQ tablet Take 1 tablet (20 mEq total) by mouth 2 (two) times daily. 180 tablet 3  . zolpidem  (AMBIEN) 5 MG tablet Take 1 tablet (5 mg total) by mouth at bedtime as needed for sleep. 30 tablet 0   No current facility-administered medications for this visit.     Social History   Social History  . Marital status: Married    Spouse name: N/A  . Number of children: 4  . Years of education: 12   Occupational History  . Retired    Social History Main Topics  . Smoking status: Former Research scientist (life sciences)  . Smokeless tobacco: Current User    Types: Snuff  . Alcohol use No  .  Drug use: No  . Sexual activity: Not on file   Other Topics Concern  . Not on file   Social History Narrative   Fun: Fish, woodworking   Additional social history is notable that he is married for 42 years.  His 4 children, 9 grandchildren, and 2 great-grandchildren.  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  He completed 12 grades years of education.  He had smoked for 30 years but quit tobacco in 2003.  There is no EtOH use.  He walks 3 days per week for 30 minutes at a time.  Family History  Problem Relation Age of Onset  . Heart disease Mother   . Prostate cancer Father   . Healthy Maternal Grandmother   . Healthy Maternal Grandfather   . Healthy Paternal Grandmother   . Healthy Paternal Grandfather     ROS General: Negative; No fevers, chills, or night sweats HEENT: Negative; No changes in vision or hearing, sinus congestion, difficulty swallowing Pulmonary: Negative; No cough, wheezing, shortness of breath, hemoptysis Cardiovascular:  See HPI;  GI: Negative; No nausea, vomiting, diarrhea, or abdominal pain GU: Remote history of bladder tumor. Musculoskeletal: Negative; no myalgias, joint pain, or weakness Hematologic/Oncologic: Negative; no easy bruising, bleeding Endocrine: Negative; no heat/cold intolerance; no diabetes Neuro: Negative; no changes in balance, headaches Skin: Negative; No rashes or skin lesions Psychiatric: Negative; No behavioral problems,  depression Sleep:  Positive for sleep apnea on CPAP ; positive for difficulty with sleep initiation Other comprehensive 14 point system review is negative   Physical Exam BP (!) 146/71   Pulse (!) 53   Ht '5\' 10"'  (1.778 m)   Wt 202 lb (91.6 kg)   BMI 28.98 kg/m   Wt Readings from Last 3 Encounters:  01/05/16 202 lb (91.6 kg)  07/13/15 202 lb 6 oz (91.8 kg)  02/04/15 207 lb 12 oz (94.2 kg)   General: Alert, oriented, no distress.  Skin: normal turgor, no rashes, warm and dry HEENT: Normocephalic, atraumatic. Pupils equal round and reactive to light; sclera anicteric; extraocular muscles intact; Fundi without hemorrhages or exudates.  Disks flat. Nose without nasal septal hypertrophy Mouth/Parynx benign; Mallinpatti scale 3 Neck: No JVD, no carotid bruits; normal carotid upstroke Lungs: clear to ausculatation and percussion; no wheezing or rales Chest wall: Positive pectus escavatum. Heart: PMI not displaced, RRR, s1 s2 normal, 1/6 systolic murmur, no diastolic murmur, no rubs, gallops, thrills, or heaves Abdomen: soft, nontender; no hepatosplenomehaly, BS+; abdominal aorta nontender and not dilated by palpation. Back: no CVA tenderness Pulses 2+;  Right radial catheterization site stable Musculoskeletal: full range of motion, normal strength, no joint deformities Extremities: no clubbing cyanosis or edema, Homan's sign negative  Neurologic: grossly nonfocal; Cranial nerves grossly wnl Psychologic: Normal mood and affect  ECG (independently read by me): sinus bradycardia 53 bpm with left bundle branch block.  First degree AV block.  December 2016 ECG (independently read by me):  Sinus bradycardia at 56 bpm with first-degree AV block with a PR interval at 216.  Left bundle branch block with repolarization changes.  10/14/2014 ECG (independently read by me): Sinus bradycardia 52 bpm per first-degree AV block with a PR interval of 212 ms.  Left bundle branch block with repolarization  changes.  LABS:  BMP Latest Ref Rng & Units 01/19/2015 12/09/2014 12/02/2014  Glucose 65 - 99 mg/dL 102(H) - 120(H)  BUN 7 - 25 mg/dL 13 - 16  Creatinine 0.70 - 1.18 mg/dL 0.95 -  0.81  Sodium 135 - 146 mmol/L 141 - 142  Potassium 3.5 - 5.3 mmol/L 3.6 3.4(L) 3.4(L)  Chloride 98 - 110 mmol/L 105 - 106  CO2 20 - 31 mmol/L 27 - 26  Calcium 8.6 - 10.3 mg/dL 8.6 - 8.6     Hepatic Function Latest Ref Rng & Units 01/19/2015  Total Protein 6.1 - 8.1 g/dL 6.7  Albumin 3.6 - 5.1 g/dL 4.0  AST 10 - 35 U/L 18  ALT 9 - 46 U/L 22  Alk Phosphatase 40 - 115 U/L 106  Total Bilirubin 0.2 - 1.2 mg/dL 0.9    CBC Latest Ref Rng & Units 01/19/2015 12/02/2014  WBC 4.0 - 10.5 K/uL 7.6 6.8  Hemoglobin 13.0 - 17.0 g/dL 13.7 13.7  Hematocrit 39.0 - 52.0 % 39.9 38.7(L)  Platelets 150 - 400 K/uL 176 178   Lab Results  Component Value Date   MCV 87.1 01/19/2015   MCV 85.6 12/02/2014   Lab Results  Component Value Date   TSH 2.828 01/19/2015   No results found for: HGBA1C   BNP No results found for: BNP  ProBNP No results found for: PROBNP   Lipid Panel     Component Value Date/Time   CHOL 100 (L) 01/19/2015 0922   TRIG 119 01/19/2015 0922   HDL 34 (L) 01/19/2015 0922   CHOLHDL 2.9 01/19/2015 0922   VLDL 24 01/19/2015 0922   LDLCALC 42 01/19/2015 0922    RADIOLOGY: No results found.   ASSESSMENT AND PLAN: Trevor Schmidt is a 75 year old white male who has a history of hypertension and has been treated with atenolol 50 mg daily, Cardura 2 mg, amlodipine 5 mg in addition to Benicar 40 mg. When I initially saw him, he was bradycardic and I reduced his atenolol down to 25 mg.  He had undergone cardiac catheterization prior to his brain aneurysm surgery with subsequent VP shunt over 10 years ago and was not told of having coronary blockage.  His prior echo Doppler study demonstrated reduced LV function with an ejection fraction of approximately 30% and suggests ischemia in both the septal and  inferior walls with associated wall motion abnormality. At cardiac catheterization there was total distal RCA occlusion with collaterals which account for ischemia seen on his nuclear imaging.  Medical therapy was recommended for his concomitant CAD.is blood pressure today is controlled and his resting se is 53, now on carvedilol at 6.25 mg day.  He is asymptomatic.  He has left bundle branch block which is chronic. He also is on Cardura and Proscar for his prostate and with his history of bladder tumor he is followed by Dr. Karsten Ro.  He continues to be on CPAP therapy with 100% compliance.  When I last saw him he was having issues with sleep initiation and this has improved with PRN zolpidem. He is tolerating atorvastatin for his hyperlipidemia.  He will continue current therapy. Fasting blood work will be obtained. Adjustments to his medical regimen will be made if necessary.  I will see him in 6 months for reevaluation.  Troy Sine, MD, [facility] 01/06/2016 10:18 PM

## 2016-01-29 DIAGNOSIS — J209 Acute bronchitis, unspecified: Secondary | ICD-10-CM | POA: Diagnosis not present

## 2016-02-23 ENCOUNTER — Other Ambulatory Visit: Payer: Self-pay | Admitting: Cardiovascular Disease

## 2016-02-23 NOTE — Telephone Encounter (Signed)
REFILL 

## 2016-03-02 DIAGNOSIS — I129 Hypertensive chronic kidney disease with stage 1 through stage 4 chronic kidney disease, or unspecified chronic kidney disease: Secondary | ICD-10-CM | POA: Diagnosis not present

## 2016-03-02 DIAGNOSIS — E538 Deficiency of other specified B group vitamins: Secondary | ICD-10-CM | POA: Diagnosis not present

## 2016-03-02 DIAGNOSIS — F17290 Nicotine dependence, other tobacco product, uncomplicated: Secondary | ICD-10-CM | POA: Diagnosis not present

## 2016-03-02 DIAGNOSIS — I5022 Chronic systolic (congestive) heart failure: Secondary | ICD-10-CM | POA: Diagnosis not present

## 2016-03-02 DIAGNOSIS — I4891 Unspecified atrial fibrillation: Secondary | ICD-10-CM | POA: Diagnosis not present

## 2016-06-07 DIAGNOSIS — G4733 Obstructive sleep apnea (adult) (pediatric): Secondary | ICD-10-CM | POA: Diagnosis not present

## 2016-07-07 ENCOUNTER — Ambulatory Visit (INDEPENDENT_AMBULATORY_CARE_PROVIDER_SITE_OTHER): Payer: PPO | Admitting: Cardiovascular Disease

## 2016-07-07 ENCOUNTER — Encounter: Payer: Self-pay | Admitting: Cardiovascular Disease

## 2016-07-07 ENCOUNTER — Ambulatory Visit: Payer: PPO | Admitting: Cardiovascular Disease

## 2016-07-07 VITALS — BP 142/68 | HR 53 | Ht 70.0 in | Wt 196.0 lb

## 2016-07-07 DIAGNOSIS — I255 Ischemic cardiomyopathy: Secondary | ICD-10-CM

## 2016-07-07 DIAGNOSIS — E785 Hyperlipidemia, unspecified: Secondary | ICD-10-CM

## 2016-07-07 DIAGNOSIS — I447 Left bundle-branch block, unspecified: Secondary | ICD-10-CM

## 2016-07-07 DIAGNOSIS — I10 Essential (primary) hypertension: Secondary | ICD-10-CM | POA: Diagnosis not present

## 2016-07-07 DIAGNOSIS — I2510 Atherosclerotic heart disease of native coronary artery without angina pectoris: Secondary | ICD-10-CM

## 2016-07-07 NOTE — Progress Notes (Signed)
Patient ID: Trevor Schmidt, male   DOB: 10-18-1940, 76 y.o.   MRN: [id number]     Primary: Trevor Po, FNP  HPI:  Trevor Schmidt recently establish cardiology care with me in September 2016.  He presents for a 7 month follow-up evaluation.  Trevor Schmidt has a history of hypertension, and previous diagnosis of mitral valve prolapse.  Remotely, he had seen Dr. Mare Ferrari.  In 2001 he had undergone a cardiac catheterization and was not told of having significant coronary blockage.  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  He recently moved back to the [place] area.  He has seen Dr. Ubaldo Glassing last year.  He has a history of obstructive sleep apnea and has been on CPAP therapy.  He received a new machine in February 2016 after his old machine malfunction.  A review of records from the [facility] system reveals that there is also a history of remote PAF, hyperlipidemia, essential hypertension.  He denies any chest pain.  He is unaware of any recent cardiac arrhythmia.  He is using CPAP with 100% compliance.  He also has a history of hyperlipidemia and has been on atorvastatin.    When I initially saw him, I scheduled him for an echo Doppler study and nuclear stress test.  The echo Doppler study showed an ejection fraction of 30% with mild LVH and severe hypokinesis of the septal wall and inferior walls.  There was septal lateral dyssynergy.  There was grade 1 diastolic dysfunction.  There was no evidence for aortic valve stenosis, but there was mild aortic insufficiency.  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  He had mitral annular calcification with trivial Trevor, mild left atrial dilatation, and mild right atrial dilatation, he underwent a nuclear perfusion study which revealed concordant data with an ejection fraction of 34%.  He has underlying left bundle branch block.  A reversible septal and inferior perfusion defect was demonstrated.      He underwent cardiac catheterization on 11/28/2014 which revealed CAD with a very short short left main, which immediately bifurcated into the LAD and left circumflex vessel. The LAD had 30% proximal stenosis immediately after the first diagonal branch. The circumflex vessel was a large caliber vessel, and there was extensive collateralization to the distal RCA branches. The RCA immediately gave rise to a conus branch and was tortuous. There was diffuse 50-60% stenoses proximal and medially beyond the acute margin prior to a large PDA vessel. The distal RCA after second small inferior LV branch was totally occluded. There were extensive collateralization to the distal RCA via the left injection.  He was felt that his inferior ischemia seen on the nuclear study was due to his distal RCA occlusion.  Medical therapy was recommended.  n June 2017 he underwent an echo Doppler study which showed an EF of 35% with Gr I DD.   His aortic root was mildly dioated at 40 mm.  Since I last saw him in December 2017, he has been without chest pain. He has remained active doing yard work and walking.  He typically walks 7/8ths of a mile in 15 minutes.  He denies shortness of breath or palpitations.  He continues to use  CPAP with 100% compliance and  since he has a fairly new ResMed AirSense 10 CPAP machine. His DME company is [employer].  He has remote history of bladder cancer and is followed by Dr. Karsten Ro.  He presents for reevaluation.   Past Medical History:  Diagnosis Date  . Arthritis   . Cancer ([place])    bladder  . Confusion   . Depression   . Dysrhythmia   . Edema    feet/ankles  . GERD (gastroesophageal reflux disease)   . HOH (hard of hearing)    aids  . Hypertension   . Kidney stones   . MVP (mitral valve prolapse)   . Prostate disease   . Sleep apnea     Past Surgical History:  Procedure Laterality Date  . BACK SURGERY    . BLADDER SURGERY     cancer  . CARDIAC CATHETERIZATION N/A  12/09/2014   Procedure: Left Heart Cath and Coronary Angiography;  Surgeon: Troy Sine, MD;  Location: [facility];  Service: Cardiovascular;  Laterality: N/A;  . CATARACT EXTRACTION W/PHACO Right 09/08/2014   Procedure: CATARACT EXTRACTION PHACO AND INTRAOCULAR LENS PLACEMENT (IOC);  Surgeon: Estill Cotta, MD;  Location: [facility];  Service: Ophthalmology;  Laterality: Right;  US:1:06.3 AP%:23.3 CDE:24.97 Fluid lot# [id number]  . CEREBRAL ANEURYSM REPAIR     coil and shunt placement  . CYSTOSCOPY     litho x 2  . EYE SURGERY    . KNEE ARTHROSCOPY    . WRIST SURGERY     ctr    Allergies  Allergen Reactions  . Ceftriaxone Rash    Current Outpatient Prescriptions  Medication Sig Dispense Refill  . amLODipine (NORVASC) 10 MG tablet TAKE 1 TABLET (10 MG TOTAL) BY MOUTH DAILY. 90 tablet 3  . amoxicillin (AMOXIL) 500 MG capsule Take 2,000 mg by mouth as needed (dental procedures).     Marland Kitchen aspirin 81 MG tablet Take 81 mg by mouth daily.    Marland Kitchen atorvastatin (LIPITOR) 40 MG tablet Take 40 mg by mouth daily.    . calcium citrate (CALCITRATE - DOSED IN MG ELEMENTAL CALCIUM) 950 MG tablet Take 200 mg of elemental calcium by mouth 2 (two) times daily.     . carvedilol (COREG) 6.25 MG tablet TAKE 1 TABLET BY MOUTH 2 TIMES DAILY 60 tablet 5  . citalopram (CELEXA) 40 MG tablet TAKE 1 TABLET (40 MG TOTAL) BY MOUTH DAILY. 30 tablet 0  . Cyanocobalamin (VITAMIN B-12 Schmidt) Take 100 mcg by mouth daily.    Marland Kitchen doxazosin (CARDURA) 2 MG tablet TAKE 1 TABLET (2 MG TOTAL) BY MOUTH DAILY. 90 tablet 3  . finasteride (PROSCAR) 5 MG tablet Take 5 mg by mouth daily.    . magnesium oxide (MAG-OX) 400 MG tablet Take 400 mg by mouth daily.    . Multiple Vitamins-Minerals (OCUVITE-LUTEIN Schmidt) Take 2 tablets by mouth daily.    Marland Kitchen olmesartan (BENICAR) 40 MG tablet TAKE 1 TABLET (40 MG TOTAL) BY MOUTH DAILY. 30 tablet 11  . omeprazole (PRILOSEC) 20 MG capsule Take 20 mg by mouth daily.    . potassium chloride SA  (K-DUR,KLOR-CON) 20 MEQ tablet Take 1 tablet (20 mEq total) by mouth 2 (two) times daily. 180 tablet 3  . zolpidem (AMBIEN) 5 MG tablet Take 1 tablet (5 mg total) by mouth at bedtime as needed for sleep. 30 tablet 0   No current facility-administered medications for this visit.     Social History   Social History  . Marital status: Married    Spouse name: N/A  . Number of children: 4  . Years of education: 12   Occupational History  . Retired    Social History Main  Topics  . Smoking status: Former Research scientist (life sciences)  . Smokeless tobacco: Current User    Types: Snuff  . Alcohol use No  . Drug use: No  . Sexual activity: Not on file   Other Topics Concern  . Not on file   Social History Narrative   Fun: Fish, woodworking   Additional social history is notable that he is married for 42 years.  His 4 children, 9 grandchildren, and 2 great-grandchildren.  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  He completed 12 grades years of education.  He had smoked for 30 years but quit tobacco in 2003.  There is no EtOH use.    Family History  Problem Relation Age of Onset  . Heart disease Mother   . Prostate cancer Father   . Healthy Maternal Grandmother   . Healthy Maternal Grandfather   . Healthy Paternal Grandmother   . Healthy Paternal Grandfather     ROS General: Negative; No fevers, chills, or night sweats HEENT: Negative; No changes in vision or hearing, sinus congestion, difficulty swallowing Pulmonary: Negative; No cough, wheezing, shortness of breath, hemoptysis Cardiovascular:  See HPI;  GI: Negative; No nausea, vomiting, diarrhea, or abdominal pain GU: Remote history of bladder tumor. Musculoskeletal: Negative; no myalgias, joint pain, or weakness Hematologic/Oncologic: Negative; no easy bruising, bleeding Endocrine: Negative; no heat/cold intolerance; no diabetes Neuro: Negative; no changes in balance, headaches Skin: Negative; No rashes or skin  lesions Psychiatric: Negative; No behavioral problems, depression Sleep:  Positive for sleep apnea on CPAP ; positive for difficulty with sleep initiation Other comprehensive 14 point system review is negative   Physical Exam BP (!) 142/68   Pulse (!) 53   Ht '5\' 10"'  (1.778 m)   Wt 196 lb (88.9 kg)   BMI 28.12 kg/m    Wt Readings from Last 3 Encounters:  07/07/16 196 lb (88.9 kg)  01/05/16 202 lb (91.6 kg)  07/13/15 202 lb 6 oz (91.8 kg)   General: Alert, oriented, no distress.  Skin: normal turgor, no rashes, warm and dry HEENT: Normocephalic, atraumatic. Pupils equal round and reactive to light; sclera anicteric; extraocular muscles intact;  Nose without nasal septal hypertrophy Mouth/Parynx benign; Mallinpatti scale 3 Neck: No JVD, no carotid bruits; normal carotid upstroke Lungs: clear to ausculatation and percussion; no wheezing or rales Chest wall: without tenderness to palpitation Heart: PMI not displaced, RRR, s1 s2 normal, 1/6 systolic murmur, no diastolic murmur, no rubs, gallops, thrills, or heaves Abdomen: soft, nontender; no hepatosplenomehaly, BS+; abdominal aorta nontender and not dilated by palpation. Back: no CVA tenderness Pulses 2+ Musculoskeletal: full range of motion, normal strength, no joint deformities Extremities: no clubbing cyanosis or edema, Homan's sign negative  Neurologic: grossly nonfocal; Cranial nerves grossly wnl Psychologic: Normal mood and affect; normal cognition   ECG (independently read by me): Sinus bradycardia 53 bpm with first-degree AV block.  PR interval 210 ms.  Left bundle branch block with repolarization changes.  Left axis deviation.  December 2017 ECG (independently read by me): sinus bradycardia 53 bpm with left bundle branch block.  First degree AV block.  December 2016 ECG (independently read by me):  Sinus bradycardia at 56 bpm with first-degree AV block with a PR interval at 216.  Left bundle branch block with  repolarization changes.  10/14/2014 ECG (independently read by me): Sinus bradycardia 52 bpm per first-degree AV block with a PR interval of 212 ms.  Left bundle branch block with repolarization changes.  LABS:  BMP Latest Ref Rng & Units 01/05/2016 01/19/2015 12/09/2014  Glucose 65 - 99 mg/dL 94 102(H) -  BUN 7 - 25 mg/dL 13 13 -  Creatinine 0.70 - 1.18 mg/dL 0.84 0.95 -  Sodium 135 - 146 mmol/L 143 141 -  Potassium 3.5 - 5.3 mmol/L 3.5 3.6 3.4(L)  Chloride 98 - 110 mmol/L 105 105 -  CO2 20 - 31 mmol/L 27 27 -  Calcium 8.6 - 10.3 mg/dL 8.7 8.6 -     Hepatic Function Latest Ref Rng & Units 01/05/2016 01/19/2015  Total Protein 6.1 - 8.1 g/dL 6.4 6.7  Albumin 3.6 - 5.1 g/dL 3.9 4.0  AST 10 - 35 U/L 17 18  ALT 9 - 46 U/L 16 22  Alk Phosphatase 40 - 115 U/L 93 106  Total Bilirubin 0.2 - 1.2 mg/dL 0.9 0.9    CBC Latest Ref Rng & Units 01/05/2016 01/19/2015 12/02/2014  WBC 3.8 - 10.8 K/uL 5.9 7.6 6.8  Hemoglobin 13.2 - 17.1 g/dL 13.3 13.7 13.7  Hematocrit 38.5 - 50.0 % 40.0 39.9 38.7(L)  Platelets 140 - 400 K/uL 144 176 178   Lab Results  Component Value Date   MCV 89.3 01/05/2016   MCV 87.1 01/19/2015   MCV 85.6 12/02/2014   Lab Results  Component Value Date   TSH 2.79 01/05/2016   No results found for: HGBA1C   BNP No results found for: BNP  ProBNP No results found for: PROBNP   Lipid Panel     Component Value Date/Time   CHOL 98 01/05/2016 0812   TRIG 79 01/05/2016 0812   HDL 44 01/05/2016 0812   CHOLHDL 2.2 01/05/2016 0812   VLDL 16 01/05/2016 0812   LDLCALC 38 01/05/2016 0812    RADIOLOGY: No results found.  IMPRESSION:  1. Essential hypertension   2. CAD in native artery   3. Cardiomyopathy, ischemic   4. Hyperlipidemia LDL goal <70   5. LBBB (left bundle branch block)    ASSESSMENT AND PLAN: Trevor. Port is a 76 year old white male who has a history of hypertension and has been treated with atenolol 50 mg daily, Cardura 2 mg, amlodipine 5 mg in  addition to Benicar 40 mg. When I initially saw him, he was bradycardic and I reduced his atenolol down to 25 mg. Subsequently, he has been changed to carvedilol and is at 6.25 mg twice a day.  He had undergone cardiac catheterization prior to his brain aneurysm surgery with subsequent VP shunt over 10 years ago and was not told of having coronary blockage. An echo Doppler study demonstrated reduced LV function with an ejection fraction of approximately 30% and suggests ischemia in both the septal and inferior walls with associated wall motion abnormality. At cardiac catheterization there was total distal RCA occlusion with collaterals which account for ischemia seen on his nuclear imaging.  Medical therapy was recommended for his concomitant CAD.  He continues to be bradycardic, but is asymptomatic.  His blood pressure today was 142/68.  I discussed with him the new hypertensive guidelines.  If his blood pressure continues to be elevated Cardura can be increased from 2 mg to 4 mg.  He has been on the Cardura  and Proscar for his prostate and with his history of bladder tumor he is followed by Dr. Karsten Ro.  He continues to be on CPAP therapy with 100% compliance.  When I last saw him he was having issues with sleep initiation and this has improved with PRN  zolpidem. He is tolerating atorvastatin for his hyperlipidemia.  In December 2018 LDL was outstanding at 45.  He will continue current therapy.  As long as he remains asymptomatic on current therapy, I will see him in one year for reevaluation.  Troy Sine, MD, [facility] 07/09/2016 3:25 PM

## 2016-07-07 NOTE — Patient Instructions (Signed)
Your physician wants you to follow-up in: 1 year or sooner If needed. You will receive a reminder letter in the mail two months in advance. If you don't receive a letter, please call our office to schedule the follow-up appointment.  If you need a refill on your cardiac medications before your next appointment, please call your pharmacy.

## 2016-07-28 ENCOUNTER — Other Ambulatory Visit: Payer: Self-pay | Admitting: Cardiovascular Disease

## 2016-08-01 ENCOUNTER — Other Ambulatory Visit: Payer: Self-pay | Admitting: Cardiovascular Disease

## 2016-08-18 ENCOUNTER — Other Ambulatory Visit: Payer: Self-pay | Admitting: Cardiovascular Disease

## 2016-09-01 DIAGNOSIS — Z0000 Encounter for general adult medical examination without abnormal findings: Secondary | ICD-10-CM | POA: Diagnosis not present

## 2016-09-01 DIAGNOSIS — Z125 Encounter for screening for malignant neoplasm of prostate: Secondary | ICD-10-CM | POA: Diagnosis not present

## 2016-09-01 DIAGNOSIS — R748 Abnormal levels of other serum enzymes: Secondary | ICD-10-CM | POA: Diagnosis not present

## 2016-09-01 DIAGNOSIS — E7800 Pure hypercholesterolemia, unspecified: Secondary | ICD-10-CM | POA: Diagnosis not present

## 2016-09-07 ENCOUNTER — Telehealth: Payer: Self-pay | Admitting: Cardiovascular Disease

## 2016-09-07 NOTE — Telephone Encounter (Signed)
Returned call, advised per [facility] protocol, patient does not need antibiotics prior to dental procedure.    Wife verbalized understanding.

## 2016-09-07 NOTE — Telephone Encounter (Signed)
New message    Pt wife is calling stating that pt has a dental procedure next week. Dr. Lambert Mody nurse states that he does not need an antibiotic before his procedure. She was told to check with pt's cardiologist just to make sure. Please call.

## 2016-09-08 DIAGNOSIS — D239 Other benign neoplasm of skin, unspecified: Secondary | ICD-10-CM | POA: Diagnosis not present

## 2016-09-08 DIAGNOSIS — D225 Melanocytic nevi of trunk: Secondary | ICD-10-CM | POA: Diagnosis not present

## 2016-09-08 DIAGNOSIS — G4700 Insomnia, unspecified: Secondary | ICD-10-CM | POA: Diagnosis not present

## 2016-09-08 DIAGNOSIS — R748 Abnormal levels of other serum enzymes: Secondary | ICD-10-CM | POA: Diagnosis not present

## 2016-09-08 DIAGNOSIS — I10 Essential (primary) hypertension: Secondary | ICD-10-CM | POA: Diagnosis not present

## 2016-09-08 DIAGNOSIS — E7800 Pure hypercholesterolemia, unspecified: Secondary | ICD-10-CM | POA: Diagnosis not present

## 2016-09-08 DIAGNOSIS — N400 Enlarged prostate without lower urinary tract symptoms: Secondary | ICD-10-CM | POA: Diagnosis not present

## 2016-09-08 DIAGNOSIS — Z85820 Personal history of malignant melanoma of skin: Secondary | ICD-10-CM | POA: Diagnosis not present

## 2016-09-08 DIAGNOSIS — Z0000 Encounter for general adult medical examination without abnormal findings: Secondary | ICD-10-CM | POA: Diagnosis not present

## 2016-09-08 DIAGNOSIS — E876 Hypokalemia: Secondary | ICD-10-CM | POA: Diagnosis not present

## 2016-09-08 DIAGNOSIS — I4891 Unspecified atrial fibrillation: Secondary | ICD-10-CM | POA: Diagnosis not present

## 2016-09-08 NOTE — Telephone Encounter (Signed)
agree

## 2016-09-15 DIAGNOSIS — E876 Hypokalemia: Secondary | ICD-10-CM | POA: Diagnosis not present

## 2016-09-18 ENCOUNTER — Other Ambulatory Visit: Payer: Self-pay | Admitting: Cardiovascular Disease

## 2016-09-19 NOTE — Telephone Encounter (Signed)
Rx(s) sent to pharmacy electronically.  

## 2016-09-22 DIAGNOSIS — E876 Hypokalemia: Secondary | ICD-10-CM | POA: Diagnosis not present

## 2016-10-04 ENCOUNTER — Other Ambulatory Visit: Payer: Self-pay | Admitting: Cardiovascular Disease

## 2016-10-04 DIAGNOSIS — D485 Neoplasm of uncertain behavior of skin: Secondary | ICD-10-CM | POA: Diagnosis not present

## 2016-10-04 DIAGNOSIS — Z85828 Personal history of other malignant neoplasm of skin: Secondary | ICD-10-CM | POA: Diagnosis not present

## 2016-10-04 DIAGNOSIS — L988 Other specified disorders of the skin and subcutaneous tissue: Secondary | ICD-10-CM | POA: Diagnosis not present

## 2016-10-04 DIAGNOSIS — D225 Melanocytic nevi of trunk: Secondary | ICD-10-CM | POA: Diagnosis not present

## 2016-10-04 NOTE — Telephone Encounter (Signed)
REFILL 

## 2016-10-25 DIAGNOSIS — H353131 Nonexudative age-related macular degeneration, bilateral, early dry stage: Secondary | ICD-10-CM | POA: Diagnosis not present

## 2016-11-01 ENCOUNTER — Other Ambulatory Visit: Payer: Self-pay | Admitting: *Deleted

## 2016-11-01 DIAGNOSIS — Z79899 Other long term (current) drug therapy: Secondary | ICD-10-CM

## 2016-11-01 DIAGNOSIS — E876 Hypokalemia: Secondary | ICD-10-CM

## 2016-11-01 MED ORDER — POTASSIUM CHLORIDE ER 20 MEQ PO TBCR: EXTENDED_RELEASE_TABLET | ORAL | 3 refills | Status: DC

## 2016-11-02 DIAGNOSIS — Z79899 Other long term (current) drug therapy: Secondary | ICD-10-CM | POA: Diagnosis not present

## 2016-11-02 DIAGNOSIS — E876 Hypokalemia: Secondary | ICD-10-CM | POA: Diagnosis not present

## 2016-11-03 LAB — BASIC METABOLIC PANEL
BUN/Creatinine Ratio: 13 (ref 10–24)
BUN: 12 mg/dL (ref 8–27)
CALCIUM: 9 mg/dL (ref 8.6–10.2)
CHLORIDE: 105 mmol/L (ref 96–106)
CO2: 24 mmol/L (ref 20–29)
Creatinine, Ser: 0.94 mg/dL (ref 0.76–1.27)
GFR, EST AFRICAN AMERICAN: 91 mL/min/{1.73_m2} (ref >59)
GFR, EST NON AFRICAN AMERICAN: 78 mL/min/{1.73_m2} (ref >59)
Glucose: 81 mg/dL (ref 65–99)
Potassium: 3.6 mmol/L (ref 3.5–5.2)
Sodium: 145 mmol/L — ABNORMAL HIGH (ref 134–144)

## 2016-11-07 DIAGNOSIS — R3912 Poor urinary stream: Secondary | ICD-10-CM | POA: Diagnosis not present

## 2016-11-07 DIAGNOSIS — N400 Enlarged prostate without lower urinary tract symptoms: Secondary | ICD-10-CM | POA: Diagnosis not present

## 2016-11-07 DIAGNOSIS — Z8551 Personal history of malignant neoplasm of bladder: Secondary | ICD-10-CM | POA: Diagnosis not present

## 2016-11-11 DIAGNOSIS — G4733 Obstructive sleep apnea (adult) (pediatric): Secondary | ICD-10-CM | POA: Diagnosis not present

## 2017-01-12 DIAGNOSIS — G4733 Obstructive sleep apnea (adult) (pediatric): Secondary | ICD-10-CM | POA: Diagnosis not present

## 2017-02-24 ENCOUNTER — Other Ambulatory Visit: Payer: Self-pay | Admitting: *Deleted

## 2017-02-24 MED ORDER — AMLODIPINE BESYLATE 10 MG PO TABS: 10.0000 mg | ORAL_TABLET | Freq: Every day | ORAL | 2 refills | Status: DC

## 2017-03-07 DIAGNOSIS — E7800 Pure hypercholesterolemia, unspecified: Secondary | ICD-10-CM | POA: Diagnosis not present

## 2017-03-10 DIAGNOSIS — E876 Hypokalemia: Secondary | ICD-10-CM | POA: Diagnosis not present

## 2017-03-10 DIAGNOSIS — Z1211 Encounter for screening for malignant neoplasm of colon: Secondary | ICD-10-CM | POA: Diagnosis not present

## 2017-03-10 DIAGNOSIS — E7800 Pure hypercholesterolemia, unspecified: Secondary | ICD-10-CM | POA: Diagnosis not present

## 2017-04-10 ENCOUNTER — Telehealth: Payer: Self-pay | Admitting: Cardiovascular Disease

## 2017-04-10 MED ORDER — CANDESARTAN CILEXETIL 16 MG PO TABS: 16.0000 mg | ORAL_TABLET | Freq: Every day | ORAL | 11 refills | Status: DC

## 2017-04-10 NOTE — Telephone Encounter (Signed)
Candesartan 16mg  daily OR losartan 100mg  daily are appropriate options for this patient.   Noted losartan 100mg  previously recalled on   Please change olmesartan to candesartan 16mg f daily  Patient should monitor BP 2-3/week for 4 weeks and contact clinic if problems noted.

## 2017-04-10 NOTE — Telephone Encounter (Signed)
Patient's wife called and made aware of the prescription change to Candesartan 16 mg. She will keep a log of his blood pressures and call back if needed.

## 2017-04-10 NOTE — Telephone Encounter (Signed)
ok 

## 2017-04-10 NOTE — Telephone Encounter (Signed)
Returned the call to the patient's wife, per the DPR. She stated that she was told by their pharmacy that Olmesartan is on back order everywhere indefinitely. She would like to know what another option there is for her husband as a replacement.

## 2017-04-10 NOTE — Telephone Encounter (Signed)
New message  Pt c/o medication issue:  1. Name of Medication: olmesartan (BENICAR) 40 MG tablet  2. How are you currently taking this medication (dosage and times per day)? TAKE ONE TABLET BY MOUTH DAILY  3. Are you having a reaction (difficulty breathing--STAT)? no  4. What is your medication issue? Pt wife verbalized that this medication has been on back order for 2 months and he will run out after 3/13 and would like to know his options. Please call

## 2017-05-16 DIAGNOSIS — S61512A Laceration without foreign body of left wrist, initial encounter: Secondary | ICD-10-CM | POA: Diagnosis not present

## 2017-05-17 ENCOUNTER — Telehealth: Payer: Self-pay | Admitting: Cardiovascular Disease

## 2017-05-17 MED ORDER — OLMESARTAN MEDOXOMIL 20 MG PO TABS: 20.0000 mg | ORAL_TABLET | Freq: Every day | ORAL | 3 refills | Status: DC

## 2017-05-17 NOTE — Telephone Encounter (Signed)
Returned call to [facility], they state patient was recently switched from olmesartan to candesartan due to backorder.   They state that candesartan is now on back order and olmesartan is available.  They wanted to verify that it was okay to switch back to patients previous dose of olmesartan.   Advised this is okay.   Trevor Schmidt states wife is monitoring BP closely and would prefer olmesartan as well.  Wife will continue to monitor and call with any issues or concerns.

## 2017-05-17 NOTE — Telephone Encounter (Signed)
New Message:   Estill Bamberg from [facility] is calling wanting to know if they switchh pt back to [facility] now have it in stock.

## 2017-05-19 ENCOUNTER — Other Ambulatory Visit: Payer: Self-pay | Admitting: Cardiovascular Disease

## 2017-06-14 DIAGNOSIS — G4733 Obstructive sleep apnea (adult) (pediatric): Secondary | ICD-10-CM | POA: Diagnosis not present

## 2017-08-21 ENCOUNTER — Ambulatory Visit: Payer: PPO | Admitting: Cardiovascular Disease

## 2017-08-21 ENCOUNTER — Encounter: Payer: Self-pay | Admitting: Cardiovascular Disease

## 2017-08-21 VITALS — BP 120/82 | HR 52 | Ht 70.0 in | Wt 197.8 lb

## 2017-08-21 DIAGNOSIS — I358 Other nonrheumatic aortic valve disorders: Secondary | ICD-10-CM | POA: Diagnosis not present

## 2017-08-21 DIAGNOSIS — I77810 Thoracic aortic ectasia: Secondary | ICD-10-CM

## 2017-08-21 DIAGNOSIS — I447 Left bundle-branch block, unspecified: Secondary | ICD-10-CM

## 2017-08-21 DIAGNOSIS — I255 Ischemic cardiomyopathy: Secondary | ICD-10-CM

## 2017-08-21 DIAGNOSIS — I2510 Atherosclerotic heart disease of native coronary artery without angina pectoris: Secondary | ICD-10-CM | POA: Diagnosis not present

## 2017-08-21 DIAGNOSIS — E785 Hyperlipidemia, unspecified: Secondary | ICD-10-CM | POA: Diagnosis not present

## 2017-08-21 DIAGNOSIS — I10 Essential (primary) hypertension: Secondary | ICD-10-CM

## 2017-08-21 NOTE — Patient Instructions (Signed)
Medication Instructions:  Your physician recommends that you continue on your current medications as directed. Please refer to the Current Medication list given to you today.  Testing/Procedures: Your physician has requested that you have an echocardiogram. Echocardiography is a painless test that uses sound waves to create images of your heart. It provides your doctor with information about the size and shape of your heart and how well your heart's chambers and valves are working. This procedure takes approximately one hour. There are no restrictions for this procedure.  This will be done at our [facility] location:  [place]: Your physician wants you to follow-up in: 1 year with Dr. Claiborne Billings.  You will receive a reminder letter in the mail two months in advance. If you don't receive a letter, please call our office to schedule the follow-up appointment.   Any Other Special Instructions Will Be Listed Below (If Applicable).  You do not need prophylactic antibiotics prior to dental procedure   If you need a refill on your cardiac medications before your next appointment, please call your pharmacy.

## 2017-08-21 NOTE — Progress Notes (Signed)
Patient ID: Lonnie L Kirtley, male   DOB: 08/03/1940, 77 y.o.   MRN: [id number]     Primary: Gregory Calone, FNP  HPI:  Arno L Garlick is a 77-year-old white male who presents for a one-year cardiology evaluation.  He established cardiology care with me in September 2016.     Mr Mccue has a history of hypertension, and previous diagnosis of mitral valve prolapse.  Remotely, he had seen Dr. Brackbill.  In 2001 he had undergone a cardiac catheterization and was not told of having significant coronary blockage.  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  He recently moved back to the [place] area.  He has seen Dr. Fath last year.  He has a history of obstructive sleep apnea and has been on CPAP therapy.  He received a new machine in February 2016 after his old machine malfunction.  A review of records from the [facility] reveals that there is also a history of remote PAF, hyperlipidemia, essential hypertension.  He denies any chest pain.  He is unaware of any recent cardiac arrhythmia.  He is using CPAP with 100% compliance.  He also has a history of hyperlipidemia and has been on atorvastatin.    When I initially saw him, I scheduled him for an echo Doppler study and nuclear stress test.  The echo Doppler study showed an ejection fraction of 30% with mild LVH and severe hypokinesis of the septal wall and inferior walls.  There was septal lateral dyssynergy.  There was grade 1 diastolic dysfunction.  There was no evidence for aortic valve stenosis, but there was mild aortic insufficiency.  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  He had mitral annular calcification with trivial MR, mild left atrial dilatation, and mild right atrial dilatation, he underwent a nuclear perfusion study which revealed concordant data with an ejection fraction of 34%.  He has underlying left bundle branch block.  A reversible septal and inferior  perfusion defect was demonstrated.    He underwent cardiac catheterization on 11/28/2014 which revealed CAD with a very short short left main, which immediately bifurcated into the LAD and left circumflex vessel. The LAD had 30% proximal stenosis immediately after the first diagonal branch. The circumflex vessel was a large caliber vessel, and there was extensive collateralization to the distal RCA branches. The RCA immediately gave rise to a conus branch and was tortuous. There was diffuse 50-60% stenoses proximal and medially beyond the acute margin prior to a large PDA vessel. The distal RCA after second small inferior LV branch was totally occluded. There were extensive collateralization to the distal RCA via the left injection.  He was felt that his inferior ischemia seen on the nuclear study was due to his distal RCA occlusion.  Medical therapy was recommended.  n June 2017 a f/u echo Doppler study showed an EF of 35% with Gr I DD.   His aortic root was mildly dioated at 40 mm.  Was aortic valve sclerosis without stenosis.  There was diffuse hypokinesis with septal lateral dyssynergy.  I last saw him in June 2018 he has been without chest pain. He  remained active doing yard work and walking.  He typically walks 7/8ths of a mile in 15 minutes.  Since I last saw him one year ago, he has continued to do well.  Continues to walk around the block occasionally and denies any exertional shortness of breath.  Denies   any chest pain palpitations, presyncope or syncope.  There is no PND orthopnea.  He had laboratory done by Dr. Loney Loh in February 2019 which showed a BUN of 11 creatinine 0.9.  LFTs were normal.  He continues to be on atorvastatin for hyperlipidemia and recent lipid studies were excellent with a total cholesterol 104, triglycerides 89, LDL 43.  He continues to use CPAP with 100% compliance and has a ResMed AirSense 10 CPAP machine. His DME company is [employer].  He has remote history of bladder  cancer and is followed by Dr. Karsten Ro.  He pesents for evaluation.   Past Medical History:  Diagnosis Date  . Arthritis   . Cancer ([place])    bladder  . Confusion   . Depression   . Dysrhythmia   . Edema    feet/ankles  . GERD (gastroesophageal reflux disease)   . HOH (hard of hearing)    aids  . Hypertension   . Kidney stones   . MVP (mitral valve prolapse)   . Prostate disease   . Sleep apnea     Past Surgical History:  Procedure Laterality Date  . BACK SURGERY    . BLADDER SURGERY     cancer  . CARDIAC CATHETERIZATION N/A 12/09/2014   Procedure: Left Heart Cath and Coronary Angiography;  Surgeon: Troy Sine, MD;  Location: [facility];  Service: Cardiovascular;  Laterality: N/A;  . CATARACT EXTRACTION W/PHACO Right 09/08/2014   Procedure: CATARACT EXTRACTION PHACO AND INTRAOCULAR LENS PLACEMENT (IOC);  Surgeon: Estill Cotta, MD;  Location: [facility];  Service: Ophthalmology;  Laterality: Right;  US:1:06.3 AP%:23.3 CDE:24.97 Fluid lot# [id number]  . CEREBRAL ANEURYSM REPAIR     coil and shunt placement  . CYSTOSCOPY     litho x 2  . EYE SURGERY    . KNEE ARTHROSCOPY    . WRIST SURGERY     ctr    Allergies  Allergen Reactions  . Buspirone Diarrhea  . Ceftriaxone Rash    Current Outpatient Medications  Medication Sig Dispense Refill  . amLODipine (NORVASC) 10 MG tablet Take 1 tablet (10 mg total) by mouth daily. 90 tablet 2  . aspirin 81 MG tablet Take 81 mg by mouth daily.    Marland Kitchen atorvastatin (LIPITOR) 40 MG tablet Take 40 mg by mouth daily.    . calcium citrate (CALCITRATE - DOSED IN MG ELEMENTAL CALCIUM) 950 MG tablet Take 200 mg of elemental calcium by mouth 2 (two) times daily.     . carvedilol (COREG) 6.25 MG tablet TAKE ONE TABLET BY MOUTH TWICE A DAY 180 tablet 2  . citalopram (CELEXA) 40 MG tablet TAKE 1 TABLET (40 MG TOTAL) BY MOUTH DAILY. 30 tablet 0  . Cyanocobalamin (VITAMIN B-12 PO) Take 100 mcg by mouth daily.    Marland Kitchen doxazosin (CARDURA) 2  MG tablet TAKE ONE TABLET BY MOUTH DAILY 90 tablet 1  . finasteride (PROSCAR) 5 MG tablet Take 5 mg by mouth daily.    . magnesium oxide (MAG-OX) 400 MG tablet Take 400 mg by mouth daily.    . Multiple Vitamins-Minerals (OCUVITE-LUTEIN PO) Take 2 tablets by mouth daily.    Marland Kitchen olmesartan (BENICAR) 40 MG tablet Take 1 tablet by mouth every morning.    Marland Kitchen omeprazole (PRILOSEC) 20 MG capsule Take 20 mg by mouth daily.    . Potassium Chloride ER 20 MEQ TBCR TAKE 1 TABLET BY MOUTH (20MG) BY MOUTH TWO TIMES A DAY 180 tablet 3  . zolpidem (AMBIEN)  5 MG tablet Take 1 tablet (5 mg total) by mouth at bedtime as needed for sleep. 30 tablet 0   No current facility-administered medications for this visit.     Social History   Socioeconomic History  . Marital status: Married    Spouse name: Not on file  . Number of children: 4  . Years of education: 12  . Highest education level: Not on file  Occupational History  . Occupation: Retired  Social Needs  . Financial resource strain: Not on file  . Food insecurity:    Worry: Not on file    Inability: Not on file  . Transportation needs:    Medical: Not on file    Non-medical: Not on file  Tobacco Use  . Smoking status: Former Smoker  . Smokeless tobacco: Current User    Types: Snuff  Substance and Sexual Activity  . Alcohol use: No  . Drug use: No  . Sexual activity: Not on file  Lifestyle  . Physical activity:    Days per week: Not on file    Minutes per session: Not on file  . Stress: Not on file  Relationships  . Social connections:    Talks on phone: Not on file    Gets together: Not on file    Attends religious service: Not on file    Active member of club or organization: Not on file    Attends meetings of clubs or organizations: Not on file    Relationship status: Not on file  . Intimate partner violence:    Fear of current or ex partner: Not on file    Emotionally abused: Not on file    Physically abused: Not on file     Forced sexual activity: Not on file  Other Topics Concern  . Not on file  Social History Narrative   Fun: Fish, woodworking   Additional social history is notable that he is married for 42 years.  His 4 children, 9 grandchildren, and 2 great-grandchildren.  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  He completed 12 grades years of education.  He had smoked for 30 years but quit tobacco in 2003.  There is no EtOH use.    Family History  Problem Relation Age of Onset  . Heart disease Mother   . Prostate cancer Father   . Healthy Maternal Grandmother   . Healthy Maternal Grandfather   . Healthy Paternal Grandmother   . Healthy Paternal Grandfather     ROS General: Negative; No fevers, chills, or night sweats HEENT: Negative; No changes in vision or hearing, sinus congestion, difficulty swallowing Pulmonary: Negative; No cough, wheezing, shortness of breath, hemoptysis Cardiovascular:  See HPI;  GI: Negative; No nausea, vomiting, diarrhea, or abdominal pain GU: Remote history of bladder tumor. Musculoskeletal: Negative; no myalgias, joint pain, or weakness Hematologic/Oncologic: Negative; no easy bruising, bleeding Endocrine: Negative; no heat/cold intolerance; no diabetes Neuro: Negative; no changes in balance, headaches Skin: Negative; No rashes or skin lesions Psychiatric: Negative; No behavioral problems, depression Sleep:  Positive for sleep apnea on CPAP ; positive for difficulty with sleep initiation Other comprehensive 14 point system review is negative   Physical Exam BP 120/82   Pulse (!) 52   Ht 5' 10" (1.778 m)   Wt 197 lb 12.8 oz (89.7 kg)   BMI 28.38 kg/m    Repeat blood pressure by me was 130/74.  Wt Readings from Last 3 Encounters:  07/07/16   196 lb (88.9 kg)  01/05/16 202 lb (91.6 kg)  07/13/15 202 lb 6 oz (91.8 kg)   General: Alert, oriented, no distress.  Skin: normal turgor, no rashes, warm and dry HEENT: Normocephalic,  atraumatic. Pupils equal round and reactive to light; sclera anicteric; extraocular muscles intact; male pattern alopecia Nose without nasal septal hypertrophy Mouth/Parynx benign; Mallinpatti scale 3 Neck: No JVD, no carotid bruits; normal carotid upstroke Lungs: clear to ausculatation and percussion; no wheezing or rales Chest wall: without tenderness to palpitation Heart: PMI not displaced, RRR, s1 s2 normal, 1/6 systolic murmur, no diastolic murmur, no rubs, gallops, thrills, or heaves Abdomen: soft, nontender; no hepatosplenomehaly, BS+; abdominal aorta nontender and not dilated by palpation. Back: no CVA tenderness Pulses 2+ Musculoskeletal: full range of motion, normal strength, no joint deformities Extremities: no clubbing cyanosis or edema, Homan's sign negative  Neurologic: grossly nonfocal; Cranial nerves grossly wnl Psychologic: Normal mood and affect  ECG (independently read by me): Sinus bradycardia 52 bpm with first-degree AV block.  Left bundle branch block.  June 2018 ECG (independently read by me): Sinus bradycardia 53 bpm with first-degree AV block.  PR interval 210 ms.  Left bundle branch block with repolarization changes.  Left axis deviation.  December 2017 ECG (independently read by me): sinus bradycardia 53 bpm with left bundle branch block.  First degree AV block.  December 2016 ECG (independently read by me):  Sinus bradycardia at 56 bpm with first-degree AV block with a PR interval at 216.  Left bundle branch block with repolarization changes.  10/14/2014 ECG (independently read by me): Sinus bradycardia 52 bpm per first-degree AV block with a PR interval of 212 ms.  Left bundle branch block with repolarization changes.  LABS:  BMP Latest Ref Rng & Units 11/02/2016 01/05/2016 01/19/2015  Glucose 65 - 99 mg/dL 81 94 102(H)  BUN 8 - 27 mg/dL 12 13 13  Creatinine 0.76 - 1.27 mg/dL 0.94 0.84 0.95  BUN/Creat Ratio 10 - 24 13 - -  Sodium 134 - 144 mmol/L 145(H) 143  141  Potassium 3.5 - 5.2 mmol/L 3.6 3.5 3.6  Chloride 96 - 106 mmol/L 105 105 105  CO2 20 - 29 mmol/L 24 27 27  Calcium 8.6 - 10.2 mg/dL 9.0 8.7 8.6     Hepatic Function Latest Ref Rng & Units 01/05/2016 01/19/2015  Total Protein 6.1 - 8.1 g/dL 6.4 6.7  Albumin 3.6 - 5.1 g/dL 3.9 4.0  AST 10 - 35 U/L 17 18  ALT 9 - 46 U/L 16 22  Alk Phosphatase 40 - 115 U/L 93 106  Total Bilirubin 0.2 - 1.2 mg/dL 0.9 0.9    CBC Latest Ref Rng & Units 01/05/2016 01/19/2015 12/02/2014  WBC 3.8 - 10.8 K/uL 5.9 7.6 6.8  Hemoglobin 13.2 - 17.1 g/dL 13.3 13.7 13.7  Hematocrit 38.5 - 50.0 % 40.0 39.9 38.7(L)  Platelets 140 - 400 K/uL 144 176 178   Lab Results  Component Value Date   MCV 89.3 01/05/2016   MCV 87.1 01/19/2015   MCV 85.6 12/02/2014   Lab Results  Component Value Date   TSH 2.79 01/05/2016   No results found for: HGBA1C   BNP No results found for: BNP  ProBNP No results found for: PROBNP   Lipid Panel     Component Value Date/Time   CHOL 98 01/05/2016 0812   TRIG 79 01/05/2016 0812   HDL 44 01/05/2016 0812   CHOLHDL 2.2 01/05/2016 0812   VLDL 16 01/05/2016   0812   LDLCALC 38 01/05/2016 0812    RADIOLOGY: No results found.  IMPRESSION:  1. CAD in native artery   2. Aortic valve sclerosis   3. Essential hypertension   4. LBBB (left bundle branch block)   5. Hyperlipidemia LDL goal <70   6. Cardiomyopathy, ischemic   7. Aortic root dilation (HCC); mild      ASSESSMENT AND PLAN: Mr. 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  Blood pressure today is stable on his current regimen consisting of amlodipine 10 mg, carvedilol 6.25 mg twice a day, Benicar 40 mg daily, in addition to doxazosin 2 mg at bedtime.  With reference to his CAD, he has not had any anginal symptomatology on his current  regimen.  He has documented distal RCA occlusion with extensive collateralization.  Lipid studies are excellent on his current regimen of atorvastatin 40 mg with most recent LDL cholesterol at 43.  He has sinus bradycardia, first-degree AV block, and left bundle branch block which is stable.  In the past he was noted to have mild aortic root dilation and aortic sclerosis.  He had reduced EF at 35% and the lateral dyssynergy most likely contributed by his underlying left bundle branch block.  His last echo Doppler study was over 2 years ago and I am recommending a follow-up echo Doppler assessment for reevaluation of systolic diastolic function in addition to his aortic root dimensions and valvular architecture.  He continues to use CPAP with 100% compliance.  He will be undergoing repeat laboratory in September with his primary physician, Dr. Walter Pharr.  Clinically he appears stable from a cardiovascular standpoint.  We will schedule the echo to be done at his convenience over the next several months.  I will contact him regarding the results.  As long as there is no significant change I will see him in 1 year for reevaluation.  However, if significant changes identified I will see him for follow-up assessment.  Time spent; 25 minutes  Thomas A. Kelly, MD, FACC 08/21/2017 8:34 AM  

## 2017-08-22 ENCOUNTER — Other Ambulatory Visit: Payer: Self-pay | Admitting: Cardiovascular Disease

## 2017-09-04 ENCOUNTER — Ambulatory Visit (HOSPITAL_COMMUNITY): Payer: PPO | Attending: Cardiology

## 2017-09-04 ENCOUNTER — Other Ambulatory Visit: Payer: Self-pay

## 2017-09-04 DIAGNOSIS — I2510 Atherosclerotic heart disease of native coronary artery without angina pectoris: Secondary | ICD-10-CM

## 2017-09-04 DIAGNOSIS — I358 Other nonrheumatic aortic valve disorders: Secondary | ICD-10-CM | POA: Diagnosis not present

## 2017-09-04 DIAGNOSIS — I509 Heart failure, unspecified: Secondary | ICD-10-CM | POA: Insufficient documentation

## 2017-09-04 DIAGNOSIS — Z87891 Personal history of nicotine dependence: Secondary | ICD-10-CM | POA: Insufficient documentation

## 2017-09-04 DIAGNOSIS — I447 Left bundle-branch block, unspecified: Secondary | ICD-10-CM | POA: Diagnosis not present

## 2017-09-04 DIAGNOSIS — I77810 Thoracic aortic ectasia: Secondary | ICD-10-CM | POA: Diagnosis not present

## 2017-09-06 DIAGNOSIS — E785 Hyperlipidemia, unspecified: Secondary | ICD-10-CM | POA: Diagnosis not present

## 2017-09-06 DIAGNOSIS — N182 Chronic kidney disease, stage 2 (mild): Secondary | ICD-10-CM | POA: Diagnosis not present

## 2017-09-06 DIAGNOSIS — Z0000 Encounter for general adult medical examination without abnormal findings: Secondary | ICD-10-CM | POA: Diagnosis not present

## 2017-09-06 DIAGNOSIS — I5022 Chronic systolic (congestive) heart failure: Secondary | ICD-10-CM | POA: Diagnosis not present

## 2017-09-06 DIAGNOSIS — I129 Hypertensive chronic kidney disease with stage 1 through stage 4 chronic kidney disease, or unspecified chronic kidney disease: Secondary | ICD-10-CM | POA: Diagnosis not present

## 2017-09-06 DIAGNOSIS — Z125 Encounter for screening for malignant neoplasm of prostate: Secondary | ICD-10-CM | POA: Diagnosis not present

## 2017-09-11 DIAGNOSIS — E7800 Pure hypercholesterolemia, unspecified: Secondary | ICD-10-CM | POA: Diagnosis not present

## 2017-09-11 DIAGNOSIS — E876 Hypokalemia: Secondary | ICD-10-CM | POA: Diagnosis not present

## 2017-09-11 DIAGNOSIS — K219 Gastro-esophageal reflux disease without esophagitis: Secondary | ICD-10-CM | POA: Diagnosis not present

## 2017-09-11 DIAGNOSIS — I2510 Atherosclerotic heart disease of native coronary artery without angina pectoris: Secondary | ICD-10-CM | POA: Diagnosis not present

## 2017-09-11 DIAGNOSIS — I10 Essential (primary) hypertension: Secondary | ICD-10-CM | POA: Diagnosis not present

## 2017-09-11 DIAGNOSIS — Z0000 Encounter for general adult medical examination without abnormal findings: Secondary | ICD-10-CM | POA: Diagnosis not present

## 2017-10-05 ENCOUNTER — Ambulatory Visit: Payer: PPO | Admitting: Cardiovascular Disease

## 2017-10-05 ENCOUNTER — Encounter: Payer: Self-pay | Admitting: Cardiovascular Disease

## 2017-10-05 VITALS — BP 152/73 | HR 53 | Ht 70.0 in | Wt 201.0 lb

## 2017-10-05 DIAGNOSIS — G4733 Obstructive sleep apnea (adult) (pediatric): Secondary | ICD-10-CM | POA: Diagnosis not present

## 2017-10-05 DIAGNOSIS — I255 Ischemic cardiomyopathy: Secondary | ICD-10-CM | POA: Diagnosis not present

## 2017-10-05 DIAGNOSIS — I2510 Atherosclerotic heart disease of native coronary artery without angina pectoris: Secondary | ICD-10-CM

## 2017-10-05 DIAGNOSIS — Z79899 Other long term (current) drug therapy: Secondary | ICD-10-CM | POA: Diagnosis not present

## 2017-10-05 DIAGNOSIS — I77810 Thoracic aortic ectasia: Secondary | ICD-10-CM

## 2017-10-05 DIAGNOSIS — I358 Other nonrheumatic aortic valve disorders: Secondary | ICD-10-CM

## 2017-10-05 MED ORDER — SACUBITRIL-VALSARTAN 49-51 MG PO TABS: 1.0000 | ORAL_TABLET | Freq: Two times a day (BID) | ORAL | 0 refills | Status: DC

## 2017-10-05 MED ORDER — POTASSIUM CHLORIDE ER 20 MEQ PO TBCR: 20.0000 meq | EXTENDED_RELEASE_TABLET | Freq: Every day | ORAL | 3 refills | Status: DC

## 2017-10-05 NOTE — Patient Instructions (Addendum)
Medication Instructions:  STOP olmesartan (Benicar)  TOMORROW 9/6 START Entresto 49/51 mg two times daily  Decrease Potassium to 20 meq once daily  Labwork: Please return for labs in 2-3 weeks (BMET,ProBNP)  Our in office lab hours are Monday-Friday 8:00-4:00, closed for lunch 12:45-1:45 pm.  No appointment needed.  Follow-Up: 3-4 weeks with pharmacist  8 weeks with Dr. Claiborne Billings  Any Other Special Instructions Will Be Listed Below (If Applicable).     If you need a refill on your cardiac medications before your next appointment, please call your pharmacy.

## 2017-10-05 NOTE — Progress Notes (Signed)
Patient ID: Trevor Schmidt, male   DOB: 1940-04-15, 77 y.o.   MRN: [id number]     Primary: [name], FNP  HPI:  Trevor Schmidt is a 77 year old white male who presents for a 2 month cardiology evaluation.  He established cardiology care with me in September 2016.     Trevor Schmidt has a history of hypertension, and previous diagnosis of mitral valve prolapse.  Remotely, he had seen Dr. Mare Ferrari.  In 2001 he had undergone a cardiac catheterization and was not told of having significant coronary blockage.  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  He recently moved back to the [place] area.  He had seen Dr. Ubaldo Glassing.  He has a history of obstructive sleep apnea and has been on CPAP therapy.  He received a new machine in February 2016 after his old machine malfunction.  A review of records from the [facility] system reveals that there is also a history of remote PAF, hyperlipidemia, essential hypertension.  He denies any chest pain.  He is unaware of any recent cardiac arrhythmia.  He is using CPAP with 100% compliance.  He also has a history of hyperlipidemia and has been on atorvastatin.    When I initially saw him, I scheduled him for an echo Doppler study and nuclear stress test.  The echo Doppler study showed an ejection fraction of 30% with mild LVH and severe hypokinesis of the septal wall and inferior walls.  There was septal lateral dyssynergy.  There was grade 1 diastolic dysfunction.  There was no evidence for aortic valve stenosis, but there was mild aortic insufficiency.  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  He had mitral annular calcification with trivial Trevor, mild left atrial dilatation, and mild right atrial dilatation, he underwent a nuclear perfusion study which revealed concordant data with an ejection fraction of 34%.  He has underlying left bundle branch block.  A reversible septal and inferior perfusion defect  was demonstrated.    He underwent cardiac catheterization on 11/28/2014 which revealed CAD with a very short short left main, which immediately bifurcated into the LAD and left circumflex vessel. The LAD had 30% proximal stenosis immediately after the first diagonal branch. The circumflex vessel was a large caliber vessel, and there was extensive collateralization to the distal RCA branches. The RCA immediately gave rise to a conus branch and was tortuous. There was diffuse 50-60% stenoses proximal and medially beyond the acute margin prior to a large PDA vessel. The distal RCA after second small inferior LV branch was totally occluded. There were extensive collateralization to the distal RCA via the left injection.  He was felt that his inferior ischemia seen on the nuclear study was due to his distal RCA occlusion.  Medical therapy was recommended.  In June 2017 a f/u echo Doppler study showed an EF of 35% with Gr I DD.   His aortic root was mildly dioated at 40 mm.  Was aortic valve sclerosis without stenosis.  There was diffuse hypokinesis with septal lateral dyssynergy.  He had laboratory done by Dr. Loney Loh in February 2019 which showed a BUN of 11 creatinine 0.9.  LFTs were normal.  He continues to be on atorvastatin for hyperlipidemia and recent lipid studies were excellent with a total cholesterol 104, triglycerides 89, LDL 43.  He continues to use CPAP with 100% compliance and has a ResMed AirSense 10 CPAP machine. His DME company  is [employer].  He has remote history of bladder cancer and is followed by Dr. Karsten Ro.    Since I last saw him, he had a follow-up echo Doppler study in September 04, 2017.  This demonstrated severe global LV dysfunction with an EF of 20 to 25%, moderate LVH, grade 1 diastolic dysfunction, mild aortic insufficiency, mild dilation of his a sending aorta, mitral annular calcification with mild Trevor, and moderate left atrial enlargement.  His EF had decreased since his last  evaluation.  He does admit to shortness of breath with exertion.  He can walk at a slow pace up to a mile and a half without shortness of breath.  He denies any chest pain.  He denies any significant edema.  He had recent laboratory done by his primary physician and his potassium was low at 3.4.  He has been on 20 mill equivalents of potassium twice a day in addition to doxazosin 2 mg, olmesartan 40 mg, amlodipine 10 mg.  He presents for reevaluation.  Past Medical History:  Diagnosis Date  . Arthritis   . Cancer ([place])    bladder  . Confusion   . Depression   . Dysrhythmia   . Edema    feet/ankles  . GERD (gastroesophageal reflux disease)   . HOH (hard of hearing)    aids  . Hypertension   . Kidney stones   . MVP (mitral valve prolapse)   . Prostate disease   . Sleep apnea     Past Surgical History:  Procedure Laterality Date  . BACK SURGERY    . BLADDER SURGERY     cancer  . CARDIAC CATHETERIZATION N/A 12/09/2014   Procedure: Left Heart Cath and Coronary Angiography;  Surgeon: Troy Sine, MD;  Location: [facility];  Service: Cardiovascular;  Laterality: N/A;  . CATARACT EXTRACTION W/PHACO Right 09/08/2014   Procedure: CATARACT EXTRACTION PHACO AND INTRAOCULAR LENS PLACEMENT (IOC);  Surgeon: Estill Cotta, MD;  Location: [facility];  Service: Ophthalmology;  Laterality: Right;  US:1:06.3 AP%:23.3 CDE:24.97 Fluid lot# [id number]  . CEREBRAL ANEURYSM REPAIR     coil and shunt placement  . CYSTOSCOPY     litho x 2  . EYE SURGERY    . KNEE ARTHROSCOPY    . WRIST SURGERY     ctr    Allergies  Allergen Reactions  . Buspirone Diarrhea  . Ceftriaxone Rash    Current Outpatient Medications  Medication Sig Dispense Refill  . amLODipine (NORVASC) 10 MG tablet Take 1 tablet (10 mg total) by mouth daily. 90 tablet 2  . aspirin 81 MG tablet Take 81 mg by mouth daily.    Marland Kitchen atorvastatin (LIPITOR) 40 MG tablet Take 40 mg by mouth daily.    . calcium citrate (CALCITRATE  - DOSED IN MG ELEMENTAL CALCIUM) 950 MG tablet Take 200 mg of elemental calcium by mouth 2 (two) times daily.     . carvedilol (COREG) 6.25 MG tablet TAKE ONE TABLET BY MOUTH TWICE A DAY 180 tablet 2  . citalopram (CELEXA) 40 MG tablet TAKE 1 TABLET (40 MG TOTAL) BY MOUTH DAILY. 30 tablet 0  . Cyanocobalamin (VITAMIN B-12 PO) Take 100 mcg by mouth daily.    Marland Kitchen doxazosin (CARDURA) 2 MG tablet TAKE ONE TABLET BY MOUTH DAILY 90 tablet 1  . finasteride (PROSCAR) 5 MG tablet Take 5 mg by mouth daily.    . magnesium oxide (MAG-OX) 400 MG tablet Take 400 mg by mouth daily.    Marland Kitchen  Multiple Vitamins-Minerals (OCUVITE-LUTEIN PO) Take 2 tablets by mouth daily.    Marland Kitchen omeprazole (PRILOSEC) 20 MG capsule Take 20 mg by mouth daily.    . Potassium Chloride ER 20 MEQ TBCR Take 20 mEq by mouth daily. 90 tablet 3  . zolpidem (AMBIEN) 5 MG tablet Take 1 tablet (5 mg total) by mouth at bedtime as needed for sleep. 30 tablet 0  . sacubitril-valsartan (ENTRESTO) 49-51 MG Take 1 tablet by mouth 2 (two) times daily. 60 tablet 0   No current facility-administered medications for this visit.     Social History   Socioeconomic History  . Marital status: Married    Spouse name: Not on file  . Number of children: 4  . Years of education: 65  . Highest education level: Not on file  Occupational History  . Occupation: Retired  Scientific laboratory technician  . Financial resource strain: Not on file  . Food insecurity:    Worry: Not on file    Inability: Not on file  . Transportation needs:    Medical: Not on file    Non-medical: Not on file  Tobacco Use  . Smoking status: Former Research scientist (life sciences)  . Smokeless tobacco: Current User    Types: Snuff  Substance and Sexual Activity  . Alcohol use: No  . Drug use: No  . Sexual activity: Not on file  Lifestyle  . Physical activity:    Days per week: Not on file    Minutes per session: Not on file  . Stress: Not on file  Relationships  . Social connections:    Talks on phone: Not on file      Gets together: Not on file    Attends religious service: Not on file    Active member of club or organization: Not on file    Attends meetings of clubs or organizations: Not on file    Relationship status: Not on file  . Intimate partner violence:    Fear of current or ex partner: Not on file    Emotionally abused: Not on file    Physically abused: Not on file    Forced sexual activity: Not on file  Other Topics Concern  . Not on file  Social History Narrative   Fun: Fish, woodworking   Additional social history is notable that he is married for 42 years.  His 4 children, 9 grandchildren, and 2 great-grandchildren.  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  He completed 12 grades years of education.  He had smoked for 30 years but quit tobacco in 2003.  There is no EtOH use.    Family History  Problem Relation Age of Onset  . Heart disease Mother   . Prostate cancer Father   . Healthy Maternal Grandmother   . Healthy Maternal Grandfather   . Healthy Paternal Grandmother   . Healthy Paternal Grandfather     ROS General: Negative; No fevers, chills, or night sweats HEENT: Negative; No changes in vision or hearing, sinus congestion, difficulty swallowing Pulmonary: Negative; No cough, wheezing, shortness of breath, hemoptysis Cardiovascular:  See HPI;  GI: Negative; No nausea, vomiting, diarrhea, or abdominal pain GU: Remote history of bladder tumor. Musculoskeletal: Negative; no myalgias, joint pain, or weakness Hematologic/Oncologic: Negative; no easy bruising, bleeding Endocrine: Negative; no heat/cold intolerance; no diabetes Neuro: Negative; no changes in balance, headaches Skin: Negative; No rashes or skin lesions Psychiatric: Negative; No behavioral problems, depression Sleep:  Positive for sleep apnea on  CPAP ; positive for difficulty with sleep initiation Other comprehensive 14 point system review is negative   Physical Exam BP (!)  152/73   Pulse (!) 53   Ht '5\' 10"'  (1.778 m)   Wt 201 lb (91.2 kg)   BMI 28.84 kg/m    Repeat blood pressure by me was 142/70  Wt Readings from Last 3 Encounters:  07/07/16 196 lb (88.9 kg)  01/05/16 202 lb (91.6 kg)  07/13/15 202 lb 6 oz (91.8 kg)   General: Alert, oriented, no distress.  Skin: normal turgor, no rashes, warm and dry HEENT: Normocephalic, atraumatic. Pupils equal round and reactive to light; sclera anicteric; extraocular muscles intact;  Nose without nasal septal hypertrophy Mouth/Parynx benign; Mallinpatti scale 3 Neck: No JVD, no carotid bruits; normal carotid upstroke Lungs: clear to ausculatation and percussion; no wheezing or rales Chest wall: without tenderness to palpitation Heart: PMI not displaced, RRR, s1 s2 normal, 1/6 systolic murmur, no diastolic murmur, no rubs, gallops, thrills, or heaves Abdomen: soft, nontender; no hepatosplenomehaly, BS+; abdominal aorta nontender and not dilated by palpation. Back: no CVA tenderness Pulses 2+ Musculoskeletal: full range of motion, normal strength, no joint deformities Extremities: no clubbing cyanosis or edema, Homan's sign negative  Neurologic: grossly nonfocal; Cranial nerves grossly wnl Psychologic: Normal mood and affect   ECG (independently read by me): Sinus bradycardia 53 bpm.  First-degree AV block.  Left bundle branch block with repolarization changes.  July 2019 ECG (independently read by me): Sinus bradycardia 52 bpm with first-degree AV block.  Left bundle branch block.  June 2018 ECG (independently read by me): Sinus bradycardia 53 bpm with first-degree AV block.  PR interval 210 ms.  Left bundle branch block with repolarization changes.  Left axis deviation.  December 2017 ECG (independently read by me): sinus bradycardia 53 bpm with left bundle branch block.  First degree AV block.  December 2016 ECG (independently read by me):  Sinus bradycardia at 56 bpm with first-degree AV block with a PR  interval at 216.  Left bundle branch block with repolarization changes.  10/14/2014 ECG (independently read by me): Sinus bradycardia 52 bpm per first-degree AV block with a PR interval of 212 ms.  Left bundle branch block with repolarization changes.  LABS:  BMP Latest Ref Rng & Units 11/02/2016 01/05/2016 01/19/2015  Glucose 65 - 99 mg/dL 81 94 102(H)  BUN 8 - 27 mg/dL '12 13 13  ' Creatinine 0.76 - 1.27 mg/dL 0.94 0.84 0.95  BUN/Creat Ratio 10 - 24 13 - -  Sodium 134 - 144 mmol/L 145(H) 143 141  Potassium 3.5 - 5.2 mmol/L 3.6 3.5 3.6  Chloride 96 - 106 mmol/L 105 105 105  CO2 20 - 29 mmol/L '24 27 27  ' Calcium 8.6 - 10.2 mg/dL 9.0 8.7 8.6     Hepatic Function Latest Ref Rng & Units 01/05/2016 01/19/2015  Total Protein 6.1 - 8.1 g/dL 6.4 6.7  Albumin 3.6 - 5.1 g/dL 3.9 4.0  AST 10 - 35 U/L 17 18  ALT 9 - 46 U/L 16 22  Alk Phosphatase 40 - 115 U/L 93 106  Total Bilirubin 0.2 - 1.2 mg/dL 0.9 0.9    CBC Latest Ref Rng & Units 01/05/2016 01/19/2015 12/02/2014  WBC 3.8 - 10.8 K/uL 5.9 7.6 6.8  Hemoglobin 13.2 - 17.1 g/dL 13.3 13.7 13.7  Hematocrit 38.5 - 50.0 % 40.0 39.9 38.7(L)  Platelets 140 - 400 K/uL 144 176 178   Lab Results  Component Value Date  MCV 89.3 01/05/2016   MCV 87.1 01/19/2015   MCV 85.6 12/02/2014   Lab Results  Component Value Date   TSH 2.79 01/05/2016   No results found for: HGBA1C   BNP No results found for: BNP  ProBNP No results found for: PROBNP   Lipid Panel     Component Value Date/Time   CHOL 98 01/05/2016 0812   TRIG 79 01/05/2016 0812   HDL 44 01/05/2016 0812   CHOLHDL 2.2 01/05/2016 0812   VLDL 16 01/05/2016 0812   LDLCALC 38 01/05/2016 0812    RADIOLOGY: No results found.  IMPRESSION:  1. Cardiomyopathy, ischemic   2. Medication management   3. CAD in native artery   4. OSA (obstructive sleep apnea)   5. Aortic root dilation ([place]); mild   6. Aortic valve sclerosis      ASSESSMENT AND PLAN: Trevor. 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  He was previously found to have reduced LV function with lateral dyssynergy most likely contributed by his underlying left bundle branch block.  I reviewed his 2-year follow-up echo Doppler study which now demonstrates worsening of LV function with an EF of 20 to 25%.  There was moderate LVH with grade 1 diastolic dysfunction.  He had mild AR, mild dilation of his ascending aorta, mitral annular calcification with mild Trevor and moderate left atrial enlargement.  With his further reduction in LV function, I have recommended he discontinue Benicar.  In its place I will initiate Entresto 49/51 mg twice daily commencing tomorrow.  He will continue to take carvedilol 6.25 mg twice a day.  He also is on amlodipine 10 mg.  I recommended he reduce his potassium supplementation to make certain his potassium does not go up following initiation of Entresto.  In 2 weeks I have recommended a bmet and proBNP.  I will schedule him to see our pharmacist in 3 to 4 weeks and if his blood pressure is stable with stable laboratory may be possible to further titrate Entresto to 97/103 mg twice daily.  He has continued to use CPAP and admits to 100% compliance.  He is followed by Dr. Karsten Ro for his bladder cancer.  I will see him in 6 to 8 weeks for follow-up evaluation.  Time spent: 25 minutes  Troy Sine, MD, [facility] 10/06/2017 1:54 PM

## 2017-10-06 ENCOUNTER — Encounter: Payer: Self-pay | Admitting: Cardiovascular Disease

## 2017-10-06 ENCOUNTER — Telehealth: Payer: Self-pay | Admitting: Cardiovascular Disease

## 2017-10-06 NOTE — Telephone Encounter (Signed)
PA submitted via Covermymeds for Entresto, returned call to [facility] and will submit PA

## 2017-10-06 NOTE — Addendum Note (Signed)
Addended by: Leland Johns A on: 10/06/2017 03:50 PM   Modules accepted: Orders

## 2017-10-06 NOTE — Telephone Encounter (Signed)
New message:      Trevor Schmidt is calling and needing ask some questions so they can approve the pt for his entresto. They state this manner is urgent

## 2017-10-09 NOTE — Telephone Encounter (Signed)
PA approved  PA Case: [id number], Status: Approved, Coverage Starts on: 10/06/2017 12:00:00 AM, Coverage Ends on: 01/30/2018 12:00:00 AM.

## 2017-10-23 DIAGNOSIS — Z79899 Other long term (current) drug therapy: Secondary | ICD-10-CM | POA: Diagnosis not present

## 2017-10-23 DIAGNOSIS — I255 Ischemic cardiomyopathy: Secondary | ICD-10-CM | POA: Diagnosis not present

## 2017-10-23 LAB — BASIC METABOLIC PANEL
BUN/Creatinine Ratio: 16 (ref 10–24)
BUN: 20 mg/dL (ref 8–27)
CALCIUM: 9.3 mg/dL (ref 8.6–10.2)
CO2: 23 mmol/L (ref 20–29)
Chloride: 101 mmol/L (ref 96–106)
Creatinine, Ser: 1.23 mg/dL (ref 0.76–1.27)
GFR, EST AFRICAN AMERICAN: 65 mL/min/{1.73_m2} (ref >59)
GFR, EST NON AFRICAN AMERICAN: 56 mL/min/{1.73_m2} — AB (ref >59)
Glucose: 106 mg/dL — ABNORMAL HIGH (ref 65–99)
POTASSIUM: 3.3 mmol/L — AB (ref 3.5–5.2)
Sodium: 142 mmol/L (ref 134–144)

## 2017-10-23 LAB — PRO B NATRIURETIC PEPTIDE: NT-Pro BNP: 380 pg/mL (ref 0–486)

## 2017-10-25 ENCOUNTER — Other Ambulatory Visit: Payer: Self-pay | Admitting: *Deleted

## 2017-10-25 DIAGNOSIS — Z79899 Other long term (current) drug therapy: Secondary | ICD-10-CM

## 2017-10-25 DIAGNOSIS — E876 Hypokalemia: Secondary | ICD-10-CM

## 2017-10-30 DIAGNOSIS — Z79899 Other long term (current) drug therapy: Secondary | ICD-10-CM | POA: Diagnosis not present

## 2017-10-30 DIAGNOSIS — E876 Hypokalemia: Secondary | ICD-10-CM | POA: Diagnosis not present

## 2017-10-30 LAB — BASIC METABOLIC PANEL
BUN/Creatinine Ratio: 15 (ref 10–24)
BUN: 17 mg/dL (ref 8–27)
CHLORIDE: 101 mmol/L (ref 96–106)
CO2: 25 mmol/L (ref 20–29)
CREATININE: 1.12 mg/dL (ref 0.76–1.27)
Calcium: 9.4 mg/dL (ref 8.6–10.2)
GFR calc Af Amer: 73 mL/min/{1.73_m2} (ref >59)
GFR calc non Af Amer: 63 mL/min/{1.73_m2} (ref >59)
GLUCOSE: 114 mg/dL — AB (ref 65–99)
POTASSIUM: 3.3 mmol/L — AB (ref 3.5–5.2)
SODIUM: 142 mmol/L (ref 134–144)

## 2017-10-31 ENCOUNTER — Encounter: Payer: Self-pay | Admitting: Pharmacist Clinician (PhC)/ Clinical Pharmacy Specialist

## 2017-10-31 ENCOUNTER — Ambulatory Visit (INDEPENDENT_AMBULATORY_CARE_PROVIDER_SITE_OTHER): Payer: PPO | Admitting: Pharmacist Clinician (PhC)/ Clinical Pharmacy Specialist

## 2017-10-31 VITALS — BP 112/62 | HR 60

## 2017-10-31 DIAGNOSIS — I255 Ischemic cardiomyopathy: Secondary | ICD-10-CM

## 2017-10-31 DIAGNOSIS — I5022 Chronic systolic (congestive) heart failure: Secondary | ICD-10-CM | POA: Diagnosis not present

## 2017-10-31 NOTE — Progress Notes (Signed)
10/31/2017 Trevor Schmidt 07/16/1940 [id number]   HPI:  Trevor Schmidt is a 77 y.o. male patient of Dr Claiborne Billings, with a PMH below who presents today for hypertension clinic evaluation.  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   His more recent metabolic panel labs have indicated low potassium levels.  At his last visit with Dr. Claiborne Billings he was started on Entresto 49/51 mg twice daily.  His potassium supplement was cut back from bid to qd in the thought that Entresto might increase his potassium level.  This has not been the case, as his most recent, from yesterday, remained at 3.3.    Today he presents for follow up and medication titration.  He reports feeling well, no adverse effects, no dizziness or chest pain.  His copay on the [name] is reasonable, his only concern is if he hits the donut hole later this year.  He has some problems with short term memory, and his wife answers many of the questions in regards to his medications.  Blood Pressure Goal:  130/80  Current Medications:  Entresto 49/51 mg bid  Carvedilol 6.25 mg bid  Amlodipine 10 mg qd - am  Doxazosin 2 mg qd - hs  Social Hx:  No tobacco, quit 2003; no alcohol, caffeine is only in sweet tea  Diet:  Almost all home cooked foods, no added salt except for 2-3 specific foods; prefers garlic/onion powders.  Exercise:  Walk daily - about 1.5 miles strolling  Home BP readings:  Home meter - first 2 weeks on Entresto showed diastolic in the 31-51 range, systolic varied widely 761-607 per wife.  Intolerances:   No cardiac medication intolerances  Labs:  10/30/17:  Na 142, K 3.3, Glu 114, BUN 17, SCr 1.12 Wt Readings from Last 3 Encounters:  10/05/17 201 lb (91.2 kg)  08/21/17 197 lb 12.8 oz (89.7 kg)  07/07/16 196 lb (88.9 kg)   BP Readings from Last 3 Encounters:  10/31/17 112/62    10/05/17 (!) 152/73  08/21/17 120/82   Pulse Readings from Last 3 Encounters:  10/31/17 60  10/05/17 (!) 53  08/21/17 (!) 52    Current Outpatient Medications  Medication Sig Dispense Refill  . amLODipine (NORVASC) 10 MG tablet Take 1 tablet (10 mg total) by mouth daily. 90 tablet 2  . aspirin 81 MG tablet Take 81 mg by mouth daily.    Marland Kitchen atorvastatin (LIPITOR) 40 MG tablet Take 40 mg by mouth daily.    . calcium citrate (CALCITRATE - DOSED IN MG ELEMENTAL CALCIUM) 950 MG tablet Take 200 mg of elemental calcium by mouth 2 (two) times daily.     . carvedilol (COREG) 6.25 MG tablet TAKE ONE TABLET BY MOUTH TWICE A DAY 180 tablet 2  . citalopram (CELEXA) 40 MG tablet TAKE 1 TABLET (40 MG TOTAL) BY MOUTH DAILY. 30 tablet 0  . Cyanocobalamin (VITAMIN B-12 PO) Take 100 mcg by mouth daily.    Marland Kitchen doxazosin (CARDURA) 2 MG tablet TAKE ONE TABLET BY MOUTH DAILY 90 tablet 1  . finasteride (PROSCAR) 5 MG tablet Take 5 mg by mouth daily.    . magnesium oxide (MAG-OX) 400 MG tablet Take 400 mg by mouth daily.    . Multiple Vitamins-Minerals (OCUVITE-LUTEIN PO) Take 2 tablets by mouth daily.    Marland Kitchen omeprazole (PRILOSEC) 20 MG capsule Take 20 mg by mouth  daily.    . Potassium Chloride ER 20 MEQ TBCR Take 20 mEq by mouth daily. 90 tablet 3  . sacubitril-valsartan (ENTRESTO) 49-51 MG Take 1 tablet by mouth 2 (two) times daily. 60 tablet 0  . zolpidem (AMBIEN) 5 MG tablet Take 1 tablet (5 mg total) by mouth at bedtime as needed for sleep. 30 tablet 0   No current facility-administered medications for this visit.     Allergies  Allergen Reactions  . Buspirone Diarrhea  . Ceftriaxone Rash    Past Medical History:  Diagnosis Date  . Arthritis   . Cancer ([place])    bladder  . Confusion   . Depression   . Dysrhythmia   . Edema    feet/ankles  . GERD (gastroesophageal reflux disease)   . HOH (hard of hearing)    aids  . Hypertension   . Kidney stones   . MVP (mitral valve prolapse)   . Prostate  disease   . Sleep apnea     Blood pressure 112/62, pulse 60.  Cardiomyopathy, ischemic Patient currently doing well with regimen of Entresto and carvedilol.  Home BP readings after starting Entresto were still labile, however in the office today is good at 112/62.  Based on that, I do not want to increase the Entresto and risk hypotensive symptoms.  He is to continue with current medications.   Regarding the hypokalemia, I have asked him to increase potassium to 20 mEq twice daily and repeat BMET in 2 weeks.    He is scheduled to see Dr. Claiborne Billings in October.  If he is able to tolerate more blood pressure lowering, would consider adding spironolactone, to help with BP, HF and to hopefully boost his potassium level.    Tommy Medal PharmD CPP [facility] [address] [place], [place] [address] [phone]

## 2017-10-31 NOTE — Assessment & Plan Note (Addendum)
Patient currently doing well with regimen of Entresto and carvedilol.  Home BP readings after starting Entresto were still labile, however in the office today is good at 112/62.  Based on that, I do not want to increase the Entresto and risk hypotensive symptoms.  He is to continue with current medications.   Regarding the hypokalemia, I have asked him to increase potassium to 20 mEq twice daily and repeat BMET in 2 weeks.    He is scheduled to see Dr. Claiborne Billings in October.  If he is able to tolerate more blood pressure lowering, would consider adding spironolactone, to help with BP, HF and to hopefully boost his potassium level.

## 2017-10-31 NOTE — Patient Instructions (Signed)
Return for a a follow up appointment with Dr. Claiborne Billings on October 31  Go to the lab in 2 weeks  Your blood pressure today is 112/62  Check your blood pressure at home daily (if able) and keep record of the readings.  Take your BP meds as follows:  Increase Entresto to 49/51 mg twice daily  Increase potassium to one tablet twice daily   Bring all of your meds, your BP cuff and your record of home blood pressures to your next appointment.  Exercise as you're able, try to walk approximately 30 minutes per day.  Keep salt intake to a minimum, especially watch canned and prepared boxed foods.  Eat more fresh fruits and vegetables and fewer canned items.  Avoid eating in fast food restaurants.    HOW TO TAKE YOUR BLOOD PRESSURE: . Rest 5 minutes before taking your blood pressure. .  Don't smoke or drink caffeinated beverages for at least 30 minutes before. . Take your blood pressure before (not after) you eat. . Sit comfortably with your back supported and both feet on the floor (don't cross your legs). . Elevate your arm to heart level on a table or a desk. . Use the proper sized cuff. It should fit smoothly and snugly around your bare upper arm. There should be enough room to slip a fingertip under the cuff. The bottom edge of the cuff should be 1 inch above the crease of the elbow. . Ideally, take 3 measurements at one sitting and record the average.

## 2017-11-01 ENCOUNTER — Other Ambulatory Visit: Payer: Self-pay | Admitting: Cardiovascular Disease

## 2017-11-03 NOTE — Telephone Encounter (Signed)
New  Message:       *STAT* If patient is at the pharmacy, call can be transferred to refill team.   1. Which medications need to be refilled? (please list name of each medication and dose if known) sacubitril-valsartan (ENTRESTO) 49-51 MG  2. Which pharmacy/location (including street and city if local pharmacy) is medication to be sent to?[place], [place] [address]  3. Do they need a 30 day or 90 day supply? [place]

## 2017-11-03 NOTE — Telephone Encounter (Signed)
Rx request sent to pharmacy.  

## 2017-11-13 DIAGNOSIS — I5022 Chronic systolic (congestive) heart failure: Secondary | ICD-10-CM | POA: Diagnosis not present

## 2017-11-13 LAB — BASIC METABOLIC PANEL
BUN/Creatinine Ratio: 16 (ref 10–24)
BUN: 17 mg/dL (ref 8–27)
CHLORIDE: 104 mmol/L (ref 96–106)
CO2: 25 mmol/L (ref 20–29)
Calcium: 9.3 mg/dL (ref 8.6–10.2)
Creatinine, Ser: 1.04 mg/dL (ref 0.76–1.27)
GFR calc non Af Amer: 69 mL/min/{1.73_m2} (ref >59)
GFR, EST AFRICAN AMERICAN: 80 mL/min/{1.73_m2} (ref >59)
GLUCOSE: 90 mg/dL (ref 65–99)
POTASSIUM: 3.7 mmol/L (ref 3.5–5.2)
Sodium: 143 mmol/L (ref 134–144)

## 2017-11-17 ENCOUNTER — Telehealth: Payer: Self-pay | Admitting: Cardiovascular Disease

## 2017-11-17 NOTE — Telephone Encounter (Signed)
Called patient, advised that the labs were in the system but Dr.Kelly had not looked over them yet, patient wife was curious on the potassium level, I gave that level and advised it had increased from previous.  Patient wife was advised we would call with official results from [facility]., she verbalized understanding.

## 2017-11-17 NOTE — Telephone Encounter (Signed)
New Message  Patients wife is calling on his behalf to obtain lab results. Please call to discuss.

## 2017-11-23 ENCOUNTER — Other Ambulatory Visit: Payer: Self-pay | Admitting: Cardiovascular Disease

## 2017-11-28 ENCOUNTER — Other Ambulatory Visit: Payer: Self-pay | Admitting: Cardiovascular Disease

## 2017-11-30 ENCOUNTER — Encounter: Payer: Self-pay | Admitting: Cardiovascular Disease

## 2017-11-30 ENCOUNTER — Ambulatory Visit: Payer: PPO | Admitting: Cardiovascular Disease

## 2017-11-30 VITALS — BP 137/74 | HR 57 | Ht 69.0 in | Wt 201.2 lb

## 2017-11-30 DIAGNOSIS — G4733 Obstructive sleep apnea (adult) (pediatric): Secondary | ICD-10-CM

## 2017-11-30 DIAGNOSIS — I10 Essential (primary) hypertension: Secondary | ICD-10-CM

## 2017-11-30 DIAGNOSIS — I5022 Chronic systolic (congestive) heart failure: Secondary | ICD-10-CM

## 2017-11-30 DIAGNOSIS — Z79899 Other long term (current) drug therapy: Secondary | ICD-10-CM | POA: Diagnosis not present

## 2017-11-30 DIAGNOSIS — I255 Ischemic cardiomyopathy: Secondary | ICD-10-CM

## 2017-11-30 MED ORDER — SPIRONOLACTONE 25 MG PO TABS: 12.5000 mg | ORAL_TABLET | Freq: Every day | ORAL | 3 refills | Status: DC

## 2017-11-30 NOTE — Progress Notes (Signed)
Patient ID: Trevor Schmidt, male   DOB: [birth year], 77 y.o.   MRN: [id number]     Primary: Trevor Po, FNP  HPI:  Trevor Schmidt is a 77 year old white male who establish care with me in September 2016.  I last saw him on October 05, 2017.He presents for a f/u cardiology evaluation.      [name] has a history of hypertension, and previous diagnosis of mitral valve prolapse.  Remotely, he had seen Dr. Mare Schmidt.  In 2001 he had undergone a cardiac catheterization and was not told of having significant coronary blockage.  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  He recently moved back to the [place] area.  He had seen Dr. Ubaldo Schmidt.  He has a history of obstructive sleep apnea and has been on CPAP therapy.  He received a new machine in February 2016 after his old machine malfunction.  A review of records from the [place] system reveals that there is also a history of remote PAF, hyperlipidemia, essential hypertension.  He denies any chest pain.  He is unaware of any recent cardiac arrhythmia.  He is using CPAP with 100% compliance.  He also has a history of hyperlipidemia and has been on atorvastatin.    When I initially saw him, I scheduled him for an echo Doppler study and nuclear stress test.  The echo Doppler study showed an ejection fraction of 30% with mild LVH and severe hypokinesis of the septal wall and inferior walls.  There was septal lateral dyssynergy.  There was grade 1 diastolic dysfunction.  There was no evidence for aortic valve stenosis, but there was mild aortic insufficiency.  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  He had mitral annular calcification with trivial MR, mild left atrial dilatation, and mild right atrial dilatation, he underwent a nuclear perfusion study which revealed concordant data with an ejection fraction of 34%.  He has underlying left bundle branch block.  A reversible septal and  inferior perfusion defect was demonstrated.    He underwent cardiac catheterization on 11/28/2014 which revealed CAD with a very short short left main, which immediately bifurcated into the LAD and left circumflex vessel. The LAD had 30% proximal stenosis immediately after the first diagonal branch. The circumflex vessel was a large caliber vessel, and there was extensive collateralization to the distal RCA branches. The RCA immediately gave rise to a conus branch and was tortuous. There was diffuse 50-60% stenoses proximal and medially beyond the acute margin prior to a large PDA vessel. The distal RCA after second small inferior LV branch was totally occluded. There were extensive collateralization to the distal RCA via the left injection.  He was felt that his inferior ischemia seen on the nuclear study was due to his distal RCA occlusion.  Medical therapy was recommended.  In June 2017 a f/u echo Doppler study showed an EF of 35% with Gr I DD.   His aortic root was mildly dioated at 40 mm.  Was aortic valve sclerosis without stenosis.  There was diffuse hypokinesis with septal lateral dyssynergy.  He had laboratory done by Trevor Schmidt in February 2019 which showed a BUN of 11 creatinine 0.9.  LFTs were normal.  He continues to be on atorvastatin for hyperlipidemia and recent lipid studies were excellent with a total cholesterol 104, triglycerides 89, LDL 43.  He continues to use CPAP with 100% compliance and has a ResMed AirSense  10 CPAP machine. His DME company is [employer].  He has remote history of bladder cancer and is followed by Dr. Karsten Schmidt.    A follow-up echo Doppler study in September 04, 2017  demonstrated severe global LV dysfunction with an EF of 20 to 25%, moderate LVH, grade 1 diastolic dysfunction, mild aortic insufficiency, mild dilation of his a sending aorta, mitral annular calcification with mild MR, and moderate left atrial enlargement.  His EF had decreased since his last evaluation.  He  noted shortness of breath with exertion.  He can walk at a slow pace up to a mile and a half without shortness of breath. He had recent laboratory done by his primary physician and his potassium was low at 3.4.  He was on 20 mill equivalents of potassium twice a day in addition to doxazosin 2 mg, olmesartan 40 mg, amlodipine 10 mg.   When I saw him on October 05, 2017, I recommended transition to [facility] and discontinued olmesartan.  I also recommended that he reduce his KCl to 20 mEq daily.  He saw Trevor Schmidt, Pharm.D. on October 31, 2017.  He was tolerating his medication well without dizziness or chest pain.  His potassium was still 3.3 checked the day before and he was advised to resume the 20 mg once twice a day of potassium supplementation.  Since his blood pressure was 112/62 Trevor Schmidt was not further titrated.  He presents today for follow-up evaluation.  He denies chest pain PND orthopnea or dizziness.   Past Medical History:  Diagnosis Date  . Arthritis   . Cancer ([place])    bladder  . Confusion   . Depression   . Dysrhythmia   . Edema    feet/ankles  . GERD (gastroesophageal reflux disease)   . HOH (hard of hearing)    aids  . Hypertension   . Kidney stones   . MVP (mitral valve prolapse)   . Prostate disease   . Sleep apnea     Past Surgical History:  Procedure Laterality Date  . BACK SURGERY    . BLADDER SURGERY     cancer  . CARDIAC CATHETERIZATION N/A 12/09/2014   Procedure: Left Heart Cath and Coronary Angiography;  Surgeon: Troy Sine, MD;  Location: [place] [facility];  Service: Cardiovascular;  Laterality: N/A;  . CATARACT EXTRACTION W/PHACO Right 09/08/2014   Procedure: CATARACT EXTRACTION PHACO AND INTRAOCULAR LENS PLACEMENT (IOC);  Surgeon: Estill Cotta, MD;  Location: [facility] ORS;  Service: Ophthalmology;  Laterality: Right;  US:1:06.3 AP%:23.3 CDE:24.97 Fluid lot# [id number]  . CEREBRAL ANEURYSM REPAIR     coil and shunt placement  . CYSTOSCOPY       litho x 2  . EYE SURGERY    . KNEE ARTHROSCOPY    . WRIST SURGERY     ctr    Allergies  Allergen Reactions  . Buspirone Diarrhea  . Ceftriaxone Rash    Current Outpatient Medications  Medication Sig Dispense Refill  . amLODipine (NORVASC) 10 MG tablet TAKE ONE TABLET BY MOUTH DAILY 90 tablet 1  . aspirin 81 MG tablet Take 81 mg by mouth daily.    Marland Kitchen atorvastatin (LIPITOR) 40 MG tablet Take 40 mg by mouth daily.    . calcium citrate (CALCITRATE - DOSED IN MG ELEMENTAL CALCIUM) 950 MG tablet Take 200 mg of elemental calcium by mouth 2 (two) times daily.     . carvedilol (COREG) 6.25 MG tablet TAKE ONE TABLET BY MOUTH TWICE A DAY 180  tablet 2  . citalopram (CELEXA) 40 MG tablet TAKE 1 TABLET (40 MG TOTAL) BY MOUTH DAILY. 30 tablet 0  . Cyanocobalamin (VITAMIN B-12 Schmidt) Take 100 mcg by mouth daily.    Marland Kitchen doxazosin (CARDURA) 2 MG tablet TAKE ONE TABLET BY MOUTH DAILY 90 tablet 1  . ENTRESTO 49-51 MG TAKE ONE TABLET BY MOUTH TWICE A DAY 60 tablet 0  . finasteride (PROSCAR) 5 MG tablet Take 5 mg by mouth daily.    . magnesium oxide (MAG-OX) 400 MG tablet Take 400 mg by mouth daily.    . Multiple Vitamins-Minerals (OCUVITE-LUTEIN Schmidt) Take 2 tablets by mouth daily.    Marland Kitchen omeprazole (PRILOSEC) 20 MG capsule Take 20 mg by mouth daily.    . Potassium Chloride ER 20 MEQ TBCR Take 20 mEq by mouth daily. 90 tablet 3  . zolpidem (AMBIEN) 5 MG tablet Take 1 tablet (5 mg total) by mouth at bedtime as needed for sleep. 30 tablet 0  . spironolactone (ALDACTONE) 25 MG tablet Take 0.5 tablets (12.5 mg total) by mouth daily. 45 tablet 3   No current facility-administered medications for this visit.     Social History   Socioeconomic History  . Marital status: Married    Spouse name: Not on file  . Number of children: 4  . Years of education: 22  . Highest education level: Not on file  Occupational History  . Occupation: Retired  Scientific laboratory technician  . Financial resource strain: Not on file  . Food  insecurity:    Worry: Not on file    Inability: Not on file  . Transportation needs:    Medical: Not on file    Non-medical: Not on file  Tobacco Use  . Smoking status: Former Research scientist (life sciences)  . Smokeless tobacco: Current User    Types: Snuff  Substance and Sexual Activity  . Alcohol use: No  . Drug use: No  . Sexual activity: Not on file  Lifestyle  . Physical activity:    Days per week: Not on file    Minutes per session: Not on file  . Stress: Not on file  Relationships  . Social connections:    Talks on phone: Not on file    Gets together: Not on file    Attends religious service: Not on file    Active member of club or organization: Not on file    Attends meetings of clubs or organizations: Not on file    Relationship status: Not on file  . Intimate partner violence:    Fear of current or ex partner: Not on file    Emotionally abused: Not on file    Physically abused: Not on file    Forced sexual activity: Not on file  Other Topics Concern  . Not on file  Social History Narrative   Fun: Fish, woodworking   Additional social history is notable that he is married for 42 years.  His 4 children, 9 grandchildren, and 2 great-grandchildren.  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  He completed 12 grades years of education.  He had smoked for 30 years but quit tobacco in 2003.  There is no EtOH use.    Family History  Problem Relation Age of Onset  . Heart disease Mother   . Prostate cancer Father   . Healthy Maternal Grandmother   . Healthy Maternal Grandfather   . Healthy Paternal Grandmother   . Healthy Paternal Grandfather  ROS General: Negative; No fevers, chills, or night sweats HEENT: Negative; No changes in vision or hearing, sinus congestion, difficulty swallowing Pulmonary: Negative; No cough, wheezing, shortness of breath, hemoptysis Cardiovascular:  See HPI;  GI: Negative; No nausea, vomiting, diarrhea, or abdominal pain GU:  Remote history of bladder tumor. Musculoskeletal: Negative; no myalgias, joint pain, or weakness Hematologic/Oncologic: Negative; no easy bruising, bleeding Endocrine: Negative; no heat/cold intolerance; no diabetes Neuro: Negative; no changes in balance, headaches Skin: Negative; No rashes or skin lesions Psychiatric: Negative; No behavioral problems, depression Sleep:  Positive for sleep apnea on CPAP ; positive for difficulty with sleep initiation Other comprehensive 14 point system review is negative   Physical Exam BP 137/74   Pulse (!) 57   Ht '5\' 9"'  (1.753 m)   Wt 201 lb 3.2 oz (91.3 kg)   BMI 29.71 kg/m    Repeat blood pressure by me was 140/74  Wt Readings from Last 3 Encounters:  07/07/16 196 lb (88.9 kg)  01/05/16 202 lb (91.6 kg)  07/13/15 202 lb 6 oz (91.8 kg)     Physical Exam BP 137/74   Pulse (!) 57   Ht '5\' 9"'  (1.753 m)   Wt 201 lb 3.2 oz (91.3 kg)   BMI 29.71 kg/m  General: Alert, oriented, no distress.  Skin: normal turgor, no rashes, warm and dry HEENT: Normocephalic, atraumatic. Pupils equal round and reactive to light; sclera anicteric; extraocular muscles intact;  Nose without nasal septal hypertrophy Mouth/Parynx benign; Mallinpatti scale 3 Neck: No JVD, no carotid bruits; normal carotid upstroke Lungs: clear to ausculatation and percussion; no wheezing or rales Chest wall: without tenderness to palpitation Heart: PMI not displaced, RRR, s1 s2 normal, 1/6 systolic murmur, no diastolic murmur, no rubs, gallops, thrills, or heaves Abdomen: soft, nontender; no hepatosplenomehaly, BS+; abdominal aorta nontender and not dilated by palpation. Back: no CVA tenderness Pulses 2+ Musculoskeletal: full range of motion, normal strength, no joint deformities Extremities: no clubbing cyanosis or edema, Homan's sign negative  Neurologic: grossly nonfocal; Cranial nerves grossly wnl Psychologic: Normal mood and affect  ECG (independently read by me): Sinus  bradycardia 57 bpm.  Left bundle branch block with repolarization changes.  Right superior axis.  October 05, 2017 ECG (independently read by me): Sinus bradycardia 53 bpm.  First-degree AV block.  Left bundle branch block with repolarization changes.  July 2019 ECG (independently read by me): Sinus bradycardia 52 bpm with first-degree AV block.  Left bundle branch block.  June 2018 ECG (independently read by me): Sinus bradycardia 53 bpm with first-degree AV block.  PR interval 210 ms.  Left bundle branch block with repolarization changes.  Left axis deviation.  December 2017 ECG (independently read by me): sinus bradycardia 53 bpm with left bundle branch block.  First degree AV block.  December 2016 ECG (independently read by me):  Sinus bradycardia at 56 bpm with first-degree AV block with a PR interval at 216.  Left bundle branch block with repolarization changes.  10/14/2014 ECG (independently read by me): Sinus bradycardia 52 bpm per first-degree AV block with a PR interval of 212 ms.  Left bundle branch block with repolarization changes.  LABS:  BMP Latest Ref Rng & Units 11/13/2017 10/30/2017 10/23/2017  Glucose 65 - 99 mg/dL 90 114(H) 106(H)  BUN 8 - 27 mg/dL '17 17 20  ' Creatinine 0.76 - 1.27 mg/dL 1.04 1.12 1.23  BUN/Creat Ratio 10 - '24 16 15 16  ' Sodium 134 - 144 mmol/L 143 142 142  Potassium 3.5 - 5.2 mmol/L 3.7 3.3(L) 3.3(L)  Chloride 96 - 106 mmol/L 104 101 101  CO2 20 - 29 mmol/L '25 25 23  ' Calcium 8.6 - 10.2 mg/dL 9.3 9.4 9.3     Hepatic Function Latest Ref Rng & Units 01/05/2016 01/19/2015  Total Protein 6.1 - 8.1 g/dL 6.4 6.7  Albumin 3.6 - 5.1 g/dL 3.9 4.0  AST 10 - 35 U/L 17 18  ALT 9 - 46 U/L 16 22  Alk Phosphatase 40 - 115 U/L 93 106  Total Bilirubin 0.2 - 1.2 mg/dL 0.9 0.9    CBC Latest Ref Rng & Units 01/05/2016 01/19/2015 12/02/2014  WBC 3.8 - 10.8 K/uL 5.9 7.6 6.8  Hemoglobin 13.2 - 17.1 g/dL 13.3 13.7 13.7  Hematocrit 38.5 - 50.0 % 40.0 39.9 38.7(L)    Platelets 140 - 400 K/uL 144 176 178   Lab Results  Component Value Date   MCV 89.3 01/05/2016   MCV 87.1 01/19/2015   MCV 85.6 12/02/2014   Lab Results  Component Value Date   TSH 2.79 01/05/2016   No results found for: HGBA1C   BNP No results found for: BNP  ProBNP    Component Value Date/Time   PROBNP 380 10/23/2017 0923     Lipid Panel     Component Value Date/Time   CHOL 98 01/05/2016 0812   TRIG 79 01/05/2016 0812   HDL 44 01/05/2016 0812   CHOLHDL 2.2 01/05/2016 0812   VLDL 16 01/05/2016 0812   LDLCALC 38 01/05/2016 0812    RADIOLOGY: No results found.  IMPRESSION:  1. Cardiomyopathy, ischemic   2. Chronic systolic congestive heart failure ([place])   3. Medication management      ASSESSMENT AND PLAN: Mr. 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  He was previously found to have reduced LV function with lateral dyssynergy most likely contributed by his underlying left bundle branch block.  I reviewed his 2-year follow-up echo Doppler study which now demonstrates worsening of LV function with an EF of 20 to 25%.  There was moderate LVH with grade 1 diastolic dysfunction.  He had mild AR, mild dilation of his ascending aorta, mitral annular calcification with mild MR and moderate left atrial enlargement.  Since his office visit in on October 05, 2017, he has been on Entresto 49/51 mg twice daily in place of olmesartan.  His blood pressure today is stable and has increased from his follow-up evaluation with Cyril Mourning, PharmD.  I will now attempt to add low-dose spironolactone at 12.5 mg for aldosterone blockade and since his potassium sparing I have recommended further reduction of his supplemental potassium from 20 mEq twice a day to just once a day.  Repeat blood work will be obtained in 2  weeks.  I will see him in 6 weeks for reevaluation.  If at that time blood pressure remained stable I will attempt titration of Entresto to its maximum dose.  He continues to use CPAP with 100% compliance.  He is not having any anginal symptoms.  Time spent: 25 minutes  Troy Sine, MD, [facility] 11/30/2017 5:40 PM

## 2017-11-30 NOTE — Patient Instructions (Signed)
Medication Instructions:  START spironolactone 12.5 mg (1/2 tablet) daily Decrease potassium to 20 meq daily  If you need a refill on your cardiac medications before your next appointment, please call your pharmacy.   Lab work: Return in 2 weeks (BMET)  If you have labs (blood work) drawn today and your tests are completely normal, you will receive your results only by: Marland Kitchen MyChart Message (if you have MyChart) OR . A paper copy in the mail If you have any lab test that is abnormal or we need to change your treatment, we will call you to review the results.  Follow-Up: 12/2 at 10 AM with Dr. Claiborne Billings

## 2017-12-01 ENCOUNTER — Other Ambulatory Visit: Payer: Self-pay | Admitting: Cardiovascular Disease

## 2017-12-05 DIAGNOSIS — Z961 Presence of intraocular lens: Secondary | ICD-10-CM | POA: Diagnosis not present

## 2017-12-14 DIAGNOSIS — Z79899 Other long term (current) drug therapy: Secondary | ICD-10-CM | POA: Diagnosis not present

## 2017-12-14 DIAGNOSIS — I5022 Chronic systolic (congestive) heart failure: Secondary | ICD-10-CM | POA: Diagnosis not present

## 2017-12-14 DIAGNOSIS — I255 Ischemic cardiomyopathy: Secondary | ICD-10-CM | POA: Diagnosis not present

## 2017-12-14 LAB — BASIC METABOLIC PANEL
BUN / CREAT RATIO: 16 (ref 10–24)
BUN: 18 mg/dL (ref 8–27)
CO2: 24 mmol/L (ref 20–29)
CREATININE: 1.12 mg/dL (ref 0.76–1.27)
Calcium: 9.5 mg/dL (ref 8.6–10.2)
Chloride: 102 mmol/L (ref 96–106)
GFR calc Af Amer: 73 mL/min/{1.73_m2} (ref >59)
GFR, EST NON AFRICAN AMERICAN: 63 mL/min/{1.73_m2} (ref >59)
Glucose: 91 mg/dL (ref 65–99)
Potassium: 4.2 mmol/L (ref 3.5–5.2)
SODIUM: 140 mmol/L (ref 134–144)

## 2017-12-18 DIAGNOSIS — G4733 Obstructive sleep apnea (adult) (pediatric): Secondary | ICD-10-CM | POA: Diagnosis not present

## 2017-12-27 ENCOUNTER — Other Ambulatory Visit: Payer: Self-pay | Admitting: Cardiovascular Disease

## 2018-01-11 ENCOUNTER — Ambulatory Visit (INDEPENDENT_AMBULATORY_CARE_PROVIDER_SITE_OTHER): Payer: PPO | Admitting: Cardiovascular Disease

## 2018-01-11 ENCOUNTER — Encounter: Payer: Self-pay | Admitting: Cardiovascular Disease

## 2018-01-11 VITALS — BP 122/78 | HR 52 | Ht 69.0 in | Wt 203.2 lb

## 2018-01-11 DIAGNOSIS — I10 Essential (primary) hypertension: Secondary | ICD-10-CM

## 2018-01-11 DIAGNOSIS — I255 Ischemic cardiomyopathy: Secondary | ICD-10-CM | POA: Diagnosis not present

## 2018-01-11 DIAGNOSIS — I5022 Chronic systolic (congestive) heart failure: Secondary | ICD-10-CM | POA: Diagnosis not present

## 2018-01-11 DIAGNOSIS — I2510 Atherosclerotic heart disease of native coronary artery without angina pectoris: Secondary | ICD-10-CM | POA: Diagnosis not present

## 2018-01-11 DIAGNOSIS — I447 Left bundle-branch block, unspecified: Secondary | ICD-10-CM

## 2018-01-11 DIAGNOSIS — Z79899 Other long term (current) drug therapy: Secondary | ICD-10-CM

## 2018-01-11 DIAGNOSIS — G4733 Obstructive sleep apnea (adult) (pediatric): Secondary | ICD-10-CM

## 2018-01-11 MED ORDER — SACUBITRIL-VALSARTAN 97-103 MG PO TABS: 1.0000 | ORAL_TABLET | Freq: Two times a day (BID) | ORAL | 3 refills | Status: DC

## 2018-01-11 NOTE — Patient Instructions (Signed)
Medication Instructions:  STOP doxazosin INCREASE Entresto to 97/103 mg two times daily  If you need a refill on your cardiac medications before your next appointment, please call your pharmacy.   Lab work: Return in 2-3 weeks (BMET, [place]) If you have labs (blood work) drawn today and your tests are completely normal, you will receive your results only by: Marland Kitchen MyChart Message (if you have MyChart) OR . A paper copy in the mail If you have any lab test that is abnormal or we need to change your treatment, we will call you to review the results.  Testing/Procedures: Your physician has requested that you have an echocardiogram in 3 MONTHS. Echocardiography is a painless test that uses sound waves to create images of your heart. It provides your doctor with information about the size and shape of your heart and how well your heart's chambers and valves are working. This procedure takes approximately one hour. There are no restrictions for this procedure.  Follow-Up: At [facility], you and your health needs are our priority.  As part of our continuing mission to provide you with exceptional heart care, we have created designated Provider Care Teams.  These Care Teams include your primary Cardiologist (physician) and Advanced Practice Providers (APPs -  Physician Assistants and Nurse Practitioners) who all work together to provide you with the care you need, when you need it. You will need a follow up appointment in 3 months.  Please call our office 2 months in advance to schedule this appointment.  You may see Dr. Claiborne Billings or one of the following Advanced Practice Providers on your designated Care Team: [place], [place] . Fabian Sharp, PA-C

## 2018-01-11 NOTE — Progress Notes (Signed)
Patient ID: Trevor Schmidt, male   DOB: 04-29-1940, 77 y.o.   MRN: [id number]     Primary: Trevor Po, FNP  HPI:  Trevor Schmidt is a 77 year old white male who establish care with me in September 2016.  I last saw him on October 31,2019. He presents for a f/u cardiology evaluation.      Trevor Schmidt has a history of hypertension, and previous diagnosis of mitral valve prolapse.  Remotely, he had seen Dr. Mare Schmidt.  In 2001 he had undergone a cardiac catheterization and was not told of having significant coronary blockage.  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  He recently moved back to the [place] area.  He had seen Dr. Ubaldo Schmidt.  He has a history of obstructive sleep apnea and has been on CPAP therapy.  He received a new machine in February 2016 after his old machine malfunction.  A review of records from the Trevor Schmidt system reveals that there is also a history of remote PAF, hyperlipidemia, essential hypertension.  He denies any chest pain.  He is unaware of any recent cardiac arrhythmia.  He is using CPAP with 100% compliance.  He also has a history of hyperlipidemia and has been on atorvastatin.    When I initially saw him, I scheduled him for an echo Doppler study and nuclear stress test.  The echo Doppler study showed an ejection fraction of 30% with mild LVH and severe hypokinesis of the septal wall and inferior walls.  There was septal lateral dyssynergy.  There was grade 1 diastolic dysfunction.  There was no evidence for aortic valve stenosis, but there was mild aortic insufficiency.  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  He had mitral annular calcification with trivial Trevor, mild left atrial dilatation, and mild right atrial dilatation, he underwent a nuclear perfusion study which revealed concordant data with an ejection fraction of 34%.  He has underlying left bundle branch block.  A reversible septal and inferior  perfusion defect was demonstrated.    He underwent cardiac catheterization on 11/28/2014 which revealed CAD with a very short short left main, which immediately bifurcated into the LAD and left circumflex vessel. The LAD had 30% proximal stenosis immediately after the first diagonal branch. The circumflex vessel was a large caliber vessel, and there was extensive collateralization to the distal RCA branches. The RCA immediately gave rise to a conus branch and was tortuous. There was diffuse 50-60% stenoses proximal and medially beyond the acute margin prior to a large PDA vessel. The distal RCA after second small inferior LV branch was totally occluded. There were extensive collateralization to the distal RCA via the left injection.  He was felt that his inferior ischemia seen on the nuclear study was due to his distal RCA occlusion.  Medical therapy was recommended.  In June 2017 a f/u echo Doppler study showed an EF of 35% with Gr I DD.   His aortic root was mildly dioated at 40 mm.  Was aortic valve sclerosis without stenosis.  There was diffuse hypokinesis with septal lateral dyssynergy.  He had laboratory done by Dr. Loney Schmidt in February 2019 which showed a BUN of 11 creatinine 0.9.  LFTs were normal.  He continues to be on atorvastatin for hyperlipidemia and recent lipid studies were excellent with a total cholesterol 104, triglycerides 89, LDL 43.  He continues to use CPAP with 100% compliance and has a ResMed AirSense  10 CPAP machine. His DME company is [employer].  He has remote history of bladder cancer and is followed by Trevor Schmidt.    A follow-up echo Doppler study in September 04, 2017  demonstrated severe global LV dysfunction with an EF of 20 to 25%, moderate LVH, grade 1 diastolic dysfunction, mild aortic insufficiency, mild dilation of his a sending aorta, mitral annular calcification with mild Trevor, and moderate left atrial enlargement.  His EF had decreased since his last evaluation.  He noted  shortness of breath with exertion.  He can walk at a slow pace up to a mile and a half without shortness of breath. He had recent laboratory done by his primary physician and his potassium was low at 3.4.  He was on 20 mill equivalents of potassium twice a day in addition to doxazosin 2 mg, olmesartan 40 mg, amlodipine 10 mg.   When I saw him on October 05, 2017, I recommended transition to Trevor Schmidt and discontinued olmesartan.  I also recommended that he reduce his KCl to 20 mEq daily.  He saw Trevor Schmidt, Pharm.D. on October 31, 2017.  He was tolerating his medication well without dizziness or chest pain.  His potassium was still 3.3 checked the day before and he was advised to resume the 20 mg once twice a day of potassium supplementation.  Since his blood pressure was 112/62 Trevor Schmidt was not further titrated.  I last saw him on November 30, 2017 he was doing well on Entresto 49/51 mg twice a day in place of olmesartan.  Pressure during that evaluation was stable and I added low-dose spironolactone 12.5 mg for aldosterone blockade.  Follow-up bmet on December 14, 2017 showed creatinine of 1.12 and potassium 4.2Continuing to use CPAP with 100% compliance.  Next month, he has continued to do well.  He denies any chest pain PND orthopnea.  He denies any orthostatic symptoms.  He presents for reevaluation.   Past Medical History:  Diagnosis Date  . Arthritis   . Cancer ([place] Schmidt)    bladder  . Confusion   . Depression   . Dysrhythmia   . Edema    feet/ankles  . GERD (gastroesophageal reflux disease)   . HOH (hard of hearing)    aids  . Hypertension   . Kidney stones   . MVP (mitral valve prolapse)   . Prostate disease   . Sleep apnea     Past Surgical History:  Procedure Laterality Date  . BACK SURGERY    . BLADDER SURGERY     cancer  . CARDIAC CATHETERIZATION N/A 12/09/2014   Procedure: Left Heart Cath and Coronary Angiography;  Surgeon: Trevor Sine, MD;  Location: [facility];  Service: Cardiovascular;  Laterality: N/A;  . CATARACT EXTRACTION W/PHACO Right 09/08/2014   Procedure: CATARACT EXTRACTION PHACO AND INTRAOCULAR LENS PLACEMENT (IOC);  Surgeon: Estill Cotta, MD;  Location: [facility];  Service: Ophthalmology;  Laterality: Right;  US:1:06.3 AP%:23.3 CDE:24.97 Fluid lot# [id number]  . CEREBRAL ANEURYSM REPAIR     coil and shunt placement  . CYSTOSCOPY     litho x 2  . EYE SURGERY    . KNEE ARTHROSCOPY    . WRIST SURGERY     ctr    Allergies  Allergen Reactions  . Buspirone Diarrhea  . Ceftriaxone Rash    Current Outpatient Medications  Medication Sig Dispense Refill  . amLODipine (NORVASC) 10 MG tablet TAKE ONE TABLET BY MOUTH DAILY 90 tablet 1  .  aspirin 81 MG tablet Take 81 mg by mouth daily.    Marland Kitchen atorvastatin (LIPITOR) 40 MG tablet Take 40 mg by mouth daily.    . calcium citrate (CALCITRATE - DOSED IN MG ELEMENTAL CALCIUM) 950 MG tablet Take 200 mg of elemental calcium by mouth 2 (two) times daily.     . carvedilol (COREG) 6.25 MG tablet TAKE ONE TABLET BY MOUTH TWICE A DAY 180 tablet 2  . citalopram (CELEXA) 40 MG tablet TAKE 1 TABLET (40 MG TOTAL) BY MOUTH DAILY. 30 tablet 0  . Cyanocobalamin (VITAMIN B-12 Schmidt) Take 100 mcg by mouth daily.    . finasteride (PROSCAR) 5 MG tablet Take 5 mg by mouth daily.    . magnesium oxide (MAG-OX) 400 MG tablet Take 400 mg by mouth daily.    . Multiple Vitamins-Minerals (OCUVITE-LUTEIN Schmidt) Take 2 tablets by mouth daily.    Marland Kitchen omeprazole (PRILOSEC) 20 MG capsule Take 20 mg by mouth daily.    . Potassium Chloride ER 20 MEQ TBCR Take 20 mEq by mouth daily. 90 tablet 3  . spironolactone (ALDACTONE) 25 MG tablet Take 0.5 tablets (12.5 mg total) by mouth daily. 45 tablet 3  . zolpidem (AMBIEN) 5 MG tablet Take 1 tablet (5 mg total) by mouth at bedtime as needed for sleep. 30 tablet 0  . sacubitril-valsartan (ENTRESTO) 97-103 MG Take 1 tablet by mouth 2 (two) times daily. 60 tablet 3   No current  facility-administered medications for this visit.     Social History   Socioeconomic History  . Marital status: Married    Spouse name: Not on file  . Number of children: 4  . Years of education: 55  . Highest education level: Not on file  Occupational History  . Occupation: Retired  Scientific laboratory technician  . Financial resource strain: Not on file  . Food insecurity:    Worry: Not on file    Inability: Not on file  . Transportation needs:    Medical: Not on file    Non-medical: Not on file  Tobacco Use  . Smoking status: Former Research scientist (life sciences)  . Smokeless tobacco: Current User    Types: Snuff  Substance and Sexual Activity  . Alcohol use: No  . Drug use: No  . Sexual activity: Not on file  Lifestyle  . Physical activity:    Days per week: Not on file    Minutes per session: Not on file  . Stress: Not on file  Relationships  . Social connections:    Talks on phone: Not on file    Gets together: Not on file    Attends religious service: Not on file    Active member of club or organization: Not on file    Attends meetings of clubs or organizations: Not on file    Relationship status: Not on file  . Intimate partner violence:    Fear of current or ex partner: Not on file    Emotionally abused: Not on file    Physically abused: Not on file    Forced sexual activity: Not on file  Other Topics Concern  . Not on file  Social History Narrative   Fun: Fish, woodworking   Additional social history is notable that he is married for 42 years.  His 4 children, 9 grandchildren, and 2 great-grandchildren.  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  He completed 12 grades years of education.  He had smoked for 30 years but  quit tobacco in 2003.  There is no EtOH use.    Family History  Problem Relation Age of Onset  . Heart disease Mother   . Prostate cancer Father   . Healthy Maternal Grandmother   . Healthy Maternal Grandfather   . Healthy Paternal Grandmother     . Healthy Paternal Grandfather     ROS General: Negative; No fevers, chills, or night sweats HEENT: Negative; No changes in vision or hearing, sinus congestion, difficulty swallowing Pulmonary: Negative; No cough, wheezing, shortness of breath, hemoptysis Cardiovascular:  See HPI;  GI: Negative; No nausea, vomiting, diarrhea, or abdominal pain GU: Remote history of bladder tumor. Musculoskeletal: Negative; no myalgias, joint pain, or weakness Hematologic/Oncologic: Negative; no easy bruising, bleeding Endocrine: Negative; no heat/cold intolerance; no diabetes Neuro: Negative; no changes in balance, headaches Skin: Negative; No rashes or skin lesions Psychiatric: Negative; No behavioral problems, depression Sleep:  Positive for sleep apnea on CPAP ; positive for difficulty with sleep initiation Other comprehensive 14 point system review is negative   Physical Exam BP 122/78   Pulse (!) 52   Ht _0  (1.753 m)   Wt 203 lb 3.2 oz (92.2 kg)   BMI 30.01 kg/m    Blood pressure by me was 136/78 supine and 130/76 standing  Wt Readings from Last 3 Encounters:  07/07/16 196 lb (88.9 kg)  01/05/16 202 lb (91.6 kg)  07/13/15 202 lb 6 oz (91.8 kg)   General: Alert, oriented, no distress.  Skin: normal turgor, no rashes, warm and dry HEENT: Normocephalic, atraumatic. Pupils equal round and reactive to light; sclera anicteric; extraocular muscles intact;  Nose without nasal septal hypertrophy Mouth/Parynx benign; Mallinpatti scale 3 Neck: No JVD, no carotid bruits; normal carotid upstroke Lungs: clear to ausculatation and percussion; no wheezing or rales Chest wall: without tenderness to palpitation Heart: PMI not displaced, RRR, s1 s2 normal, 1/6 systolic murmur, no diastolic murmur, no rubs, gallops, thrills, or heaves Abdomen: soft, nontender; no hepatosplenomehaly, BS+; abdominal aorta nontender and not dilated by palpation. Back: no CVA tenderness Pulses 2+ Musculoskeletal:  full range of motion, normal strength, no joint deformities Extremities: no clubbing cyanosis or edema, Homan's sign negative  Neurologic: grossly nonfocal; Cranial nerves grossly wnl Psychologic: Normal mood and affect   ECG (independently read by me): Sinus bradycardia 52 bpm.  First-degree AV block with appeared normal at 220 ms.  Left bundle branch block.  November 30, 2017 ECG (independently read by me): Sinus bradycardia 57 bpm.  Left bundle branch block with repolarization changes.  Right superior axis.  October 05, 2017 ECG (independently read by me): Sinus bradycardia 53 bpm.  First-degree AV block.  Left bundle branch block with repolarization changes.  July 2019 ECG (independently read by me): Sinus bradycardia 52 bpm with first-degree AV block.  Left bundle branch block.  June 2018 ECG (independently read by me): Sinus bradycardia 53 bpm with first-degree AV block.  PR interval 210 ms.  Left bundle branch block with repolarization changes.  Left axis deviation.  December 2017 ECG (independently read by me): sinus bradycardia 53 bpm with left bundle branch block.  First degree AV block.  December 2016 ECG (independently read by me):  Sinus bradycardia at 56 bpm with first-degree AV block with a PR interval at 216.  Left bundle branch block with repolarization changes.  10/14/2014 ECG (independently read by me): Sinus bradycardia 52 bpm per first-degree AV block with a PR interval of 212 ms.  Left bundle branch block with  repolarization changes.  LABS:  BMP Latest Ref Rng & Units 12/14/2017 11/13/2017 10/30/2017  Glucose 65 - 99 mg/dL 91 90 114(H)  BUN 8 - 27 mg/dL _0 Creatinine 0.76 - 1.27 mg/dL 1.12 1.04 1.12  BUN/Creat Ratio 10 - _1 Sodium 134 - 144 mmol/L 140 143 142  Potassium 3.5 - 5.2 mmol/L 4.2 3.7 3.3(L)  Chloride 96 - 106 mmol/L 102 104 101  CO2 20 - 29 mmol/L _2 Calcium 8.6 - 10.2 mg/dL 9.5 9.3 9.4     Hepatic Function Latest Ref Rng &  Units 01/05/2016 01/19/2015  Total Protein 6.1 - 8.1 g/dL 6.4 6.7  Albumin 3.6 - 5.1 g/dL 3.9 4.0  AST 10 - 35 U/L 17 18  ALT 9 - 46 U/L 16 22  Alk Phosphatase 40 - 115 U/L 93 106  Total Bilirubin 0.2 - 1.2 mg/dL 0.9 0.9    CBC Latest Ref Rng & Units 01/05/2016 01/19/2015 12/02/2014  WBC 3.8 - 10.8 K/uL 5.9 7.6 6.8  Hemoglobin 13.2 - 17.1 g/dL 13.3 13.7 13.7  Hematocrit 38.5 - 50.0 % 40.0 39.9 38.7(L)  Platelets 140 - 400 K/uL 144 176 178   Lab Results  Component Value Date   MCV 89.3 01/05/2016   MCV 87.1 01/19/2015   MCV 85.6 12/02/2014   Lab Results  Component Value Date   TSH 2.79 01/05/2016   No results found for: HGBA1C   BNP No results found for: BNP  ProBNP    Component Value Date/Time   PROBNP 380 10/23/2017 0923     Lipid Panel     Component Value Date/Time   CHOL 98 01/05/2016 0812   TRIG 79 01/05/2016 0812   HDL 44 01/05/2016 0812   CHOLHDL 2.2 01/05/2016 0812   VLDL 16 01/05/2016 0812   LDLCALC 38 01/05/2016 0812    RADIOLOGY: No results found.  IMPRESSION:  1. Cardiomyopathy, ischemic   2. Chronic systolic congestive heart failure ([place])   3. Medication management   4. Essential hypertension   5. LBBB (left bundle branch block)   6. CAD in native artery   7. OSA (obstructive sleep apnea)      ASSESSMENT AND PLAN: Trevor. 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  He was  found to have reduced LV function with lateral dyssynergy most likely contributed by his underlying left bundle branch block.  A follow-up echo Doppler study in August 2018 showed further reduction in LV function  with an EF of 20 to 25%.  There was moderate LVH with grade 1 diastolic dysfunction.  He had mild AR, mild dilation of his ascending aorta, mitral annular calcification with mild Trevor  and moderate left atrial enlargement.  Since his office visit in on October 05, 2017, he has been on Entresto 49./51 mg twice a day in place of olmesartan and at his last office visit I initiated low-dose spironolactone.  His blood pressure today remains stable and he is tolerating therapy.  I will now further titrate Entresto to maximum dosing at 97/103 mg twice daily.  I have recommended he discontinue doxazosin.  He denies any urinary issues and apparently may have just been on this for blood pressure issues and was remotely started years ago by his primary MD.  He continues to be on atorvastatin  40 mg daily for hyperlipidemia.  LDL cholesterol in August 2019 was excellent at 48.  On his increased Entresto he will undergo a follow-up BMP as well as proBNP in several weeks.  In 3 months, I have suggested he undergo a follow-up echo Doppler study to see if there has been any improvement in LV function with transition and ultimate maximum dosing of ARNI therapy.  He continues to use CPAP with 100% compliance.  He is sleeping well.  He has no daytime sleepiness.  There is no residual snoring.  He is not having any anginal symptomatology on his current regimen.  He has chronic left bundle branch block which is stable.  I will see him in 3 months for follow-up evaluation.  Time spent 25 minutes  Trevor Sine, MD, [facility] 01/13/2018 11:11 AM

## 2018-01-13 ENCOUNTER — Encounter: Payer: Self-pay | Admitting: Cardiovascular Disease

## 2018-01-29 DIAGNOSIS — Z79899 Other long term (current) drug therapy: Secondary | ICD-10-CM | POA: Diagnosis not present

## 2018-01-29 DIAGNOSIS — I5022 Chronic systolic (congestive) heart failure: Secondary | ICD-10-CM | POA: Diagnosis not present

## 2018-01-29 DIAGNOSIS — I255 Ischemic cardiomyopathy: Secondary | ICD-10-CM | POA: Diagnosis not present

## 2018-01-29 DIAGNOSIS — I10 Essential (primary) hypertension: Secondary | ICD-10-CM | POA: Diagnosis not present

## 2018-01-30 LAB — BASIC METABOLIC PANEL
BUN/Creatinine Ratio: 13 (ref 10–24)
BUN: 15 mg/dL (ref 8–27)
CHLORIDE: 103 mmol/L (ref 96–106)
CO2: 21 mmol/L (ref 20–29)
CREATININE: 1.14 mg/dL (ref 0.76–1.27)
Calcium: 8.8 mg/dL (ref 8.6–10.2)
GFR calc Af Amer: 71 mL/min/{1.73_m2} (ref >59)
GFR calc non Af Amer: 62 mL/min/{1.73_m2} (ref >59)
Glucose: 89 mg/dL (ref 65–99)
Potassium: 3.9 mmol/L (ref 3.5–5.2)
Sodium: 140 mmol/L (ref 134–144)

## 2018-01-30 LAB — PRO B NATRIURETIC PEPTIDE: NT-PRO BNP: 470 pg/mL (ref 0–486)

## 2018-02-19 ENCOUNTER — Telehealth: Payer: Self-pay | Admitting: Cardiovascular Disease

## 2018-02-19 ENCOUNTER — Other Ambulatory Visit: Payer: Self-pay | Admitting: Pharmacist

## 2018-02-19 MED ORDER — SACUBITRIL-VALSARTAN 97-103 MG PO TABS: 1.0000 | ORAL_TABLET | Freq: Two times a day (BID) | ORAL | 2 refills | Status: DC

## 2018-02-19 NOTE — Patient Outreach (Signed)
[place] [facility]) Care Management  02/19/2018  ROSHAWN AYALA 01/15/1941 [id number]   Incoming call from Mrs. Dontay Harm (patient's spouse), on behalf of [place], in response to the [facility] Medication Adherence Campaign. Note that Mrs. Spagnoli is listed on patient's Designated Party Release in chart. HIPAA identifiers verified and verbal consent received.  Mrs. Ercole reports that she manages the patient's medications for him, filling each into a weekly pillbox and administering to him each day. Reports that he currently takes Entresto 103-97 mg twice daily as directed. Note that patient was switched from olmesartan to [facility] in September per chart. Mrs. Aune denies patient missing any doses or any barriers to adherence. Reports that she monitors his blood pressure at home. Counsel on importance of adherence.  Note that patient has been receiving 30 day supplys of the [facility]. Spouse reports that patient received 90 day supplies of his other medications, but was under the impression that insurance limited him to a 30 day supply of this medication. Review formulary for patient's plan from the [employer] website and let spouse know that patient can receive a 90 day supply for cost savings with an updated prescription from his provider.  PLAN  Will call patient's Cardiologist to request that a 90 day supply of patient's Entresto be called into his pharmacy for cost savings to the patient.  Harlow Asa, PharmD, [facility] [phone]

## 2018-02-19 NOTE — Telephone Encounter (Signed)
Rx has been sent to the pharmacy electronically. ° °

## 2018-02-19 NOTE — Telephone Encounter (Signed)
New mwssage    *STAT* If patient is at the pharmacy, call can be transferred to refill team.   1. Which medications need to be refilled? (please list name of each medication and dose if known) sacubitril-valsartan (ENTRESTO) 97-103 MG  2. Which pharmay/location (including street and city if local pharmacy) is medication to be sent to?[place], [place] [address]   3. Do they need a 30 day or 90 day supply? [place] a Pharmacists from [employer] calling to see if pt can get 90 day supply instead of 30 to save on cost.

## 2018-02-19 NOTE — Patient Outreach (Signed)
[place] [facility]) Care Management  02/19/2018  LOUAY MYRIE [birth year] [id number]  Call patient's Cardiologist office to request that a 90 day supply of patient's Entresto be called into his pharmacy for cost savings to the patient. Speak with Ronny Bacon in the office.   Note that per chart 90 day prescription is called into the pharmacy following this call.  Will close pharmacy episode  Harlow Asa, PharmD, [facility] [phone]

## 2018-03-12 DIAGNOSIS — E7800 Pure hypercholesterolemia, unspecified: Secondary | ICD-10-CM | POA: Diagnosis not present

## 2018-03-14 ENCOUNTER — Other Ambulatory Visit: Payer: Self-pay | Admitting: Cardiovascular Disease

## 2018-03-19 DIAGNOSIS — I10 Essential (primary) hypertension: Secondary | ICD-10-CM | POA: Diagnosis not present

## 2018-03-19 DIAGNOSIS — I2510 Atherosclerotic heart disease of native coronary artery without angina pectoris: Secondary | ICD-10-CM | POA: Diagnosis not present

## 2018-03-19 DIAGNOSIS — K219 Gastro-esophageal reflux disease without esophagitis: Secondary | ICD-10-CM | POA: Diagnosis not present

## 2018-03-19 DIAGNOSIS — E7800 Pure hypercholesterolemia, unspecified: Secondary | ICD-10-CM | POA: Diagnosis not present

## 2018-04-04 NOTE — Progress Notes (Signed)
Notes recorded by Troy Sine, MD on 04/01/2018 at 11:06 AM EST Outside labs from Dr. Loney Loh reviewed; stable

## 2018-04-11 ENCOUNTER — Ambulatory Visit (HOSPITAL_COMMUNITY): Payer: PPO | Attending: Cardiovascular Disease

## 2018-04-11 ENCOUNTER — Other Ambulatory Visit: Payer: Self-pay

## 2018-04-11 DIAGNOSIS — I5022 Chronic systolic (congestive) heart failure: Secondary | ICD-10-CM

## 2018-04-11 DIAGNOSIS — I255 Ischemic cardiomyopathy: Secondary | ICD-10-CM | POA: Diagnosis not present

## 2018-04-19 ENCOUNTER — Ambulatory Visit: Payer: PPO | Admitting: Cardiovascular Disease

## 2018-04-19 ENCOUNTER — Encounter: Payer: Self-pay | Admitting: Cardiovascular Disease

## 2018-04-19 ENCOUNTER — Other Ambulatory Visit: Payer: Self-pay

## 2018-04-19 DIAGNOSIS — I255 Ischemic cardiomyopathy: Secondary | ICD-10-CM

## 2018-04-19 DIAGNOSIS — I2510 Atherosclerotic heart disease of native coronary artery without angina pectoris: Secondary | ICD-10-CM

## 2018-04-19 DIAGNOSIS — I447 Left bundle-branch block, unspecified: Secondary | ICD-10-CM | POA: Diagnosis not present

## 2018-04-19 DIAGNOSIS — I77810 Thoracic aortic ectasia: Secondary | ICD-10-CM

## 2018-04-19 DIAGNOSIS — G4733 Obstructive sleep apnea (adult) (pediatric): Secondary | ICD-10-CM | POA: Diagnosis not present

## 2018-04-19 DIAGNOSIS — I10 Essential (primary) hypertension: Secondary | ICD-10-CM | POA: Diagnosis not present

## 2018-04-19 NOTE — Patient Instructions (Signed)

## 2018-04-19 NOTE — Progress Notes (Signed)
Patient ID: Trevor Schmidt, male   DOB: 17-Sep-1940, 78 y.o.   MRN: [id number]     Primary: Trevor Po, FNP  HPI:  Trevor Schmidt is a 78 year old white male who established care with me in September 2016.  I last saw him on January 11, 2018. He presents for a f/u cardiology evaluation.      Trevor Schmidt has a history of hypertension, and previous diagnosis of mitral valve prolapse.  Remotely, he had seen Dr. Mare Ferrari.  In 2001 he had undergone a cardiac catheterization and was not told of having significant coronary blockage.  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  He recently moved back to the [place] area.  He had seen Dr. Ubaldo Glassing.  He has a history of obstructive sleep apnea and has been on CPAP therapy.  He received a new machine in February 2016 after his old machine malfunction.  A review of records from the [facility] system reveals that there is also a history of remote PAF, hyperlipidemia, essential hypertension.  He denies any chest pain.  He is unaware of any recent cardiac arrhythmia.  He is using CPAP with 100% compliance.  He also has a history of hyperlipidemia and has been on atorvastatin.    When I initially saw him, I scheduled him for an echo Doppler study and nuclear stress test.  The echo Doppler study showed an ejection fraction of 30% with mild LVH and severe hypokinesis of the septal wall and inferior walls.  There was septal lateral dyssynergy.  There was grade 1 diastolic dysfunction.  There was no evidence for aortic valve stenosis, but there was mild aortic insufficiency.  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  He had mitral annular calcification with trivial Trevor, mild left atrial dilatation, and mild right atrial dilatation, he underwent a nuclear perfusion study which revealed concordant data with an ejection fraction of 34%.  He has underlying left bundle branch block.  A reversible septal and  inferior perfusion defect was demonstrated.    He underwent cardiac catheterization on 11/28/2014 which revealed CAD with a very short short left main, which immediately bifurcated into the LAD and left circumflex vessel. The LAD had 30% proximal stenosis immediately after the first diagonal branch. The circumflex vessel was a large caliber vessel, and there was extensive collateralization to the distal RCA branches. The RCA immediately gave rise to a conus branch and was tortuous. There was diffuse 50-60% stenoses proximal and medially beyond the acute margin prior to a large PDA vessel. The distal RCA after second small inferior LV branch was totally occluded. There were extensive collateralization to the distal RCA via the left injection.  He was felt that his inferior ischemia seen on the nuclear study was due to his distal RCA occlusion.  Medical therapy was recommended.  In June 2017 a f/u echo Doppler study showed an EF of 35% with Gr I DD.   His aortic root was mildly dioated at 40 mm.  Was aortic valve sclerosis without stenosis.  There was diffuse hypokinesis with septal lateral dyssynergy.  He had laboratory done by Dr. Loney Loh in February 2019 which showed a BUN of 11 creatinine 0.9.  LFTs were normal.  He continues to be on atorvastatin for hyperlipidemia and recent lipid studies were excellent with a total cholesterol 104, triglycerides 89, LDL 43.  He continues to use CPAP with 100% compliance and has a [employer]	[place]  AirSense 10 CPAP machine. His DME company is [employer].  He has remote history of bladder cancer and is followed by Dr. Karsten Ro.    A follow-up echo Doppler study in September 04, 2017  demonstrated severe global LV dysfunction with an EF of 20 to 25%, moderate LVH, grade 1 diastolic dysfunction, mild aortic insufficiency, mild dilation of his a sending aorta, mitral annular calcification with mild Trevor, and moderate left atrial enlargement.  His EF had decreased since his last evaluation.  He  noted shortness of breath with exertion.  He can walk at a slow pace up to a mile and a half without shortness of breath. He had recent laboratory done by his primary physician and his potassium was low at 3.4.  He was on 20 mill equivalents of potassium twice a day in addition to doxazosin 2 mg, olmesartan 40 mg, amlodipine 10 mg.   When I saw him on October 05, 2017, I recommended transition to [place] and discontinued olmesartan.  I also recommended that he reduce his KCl to 20 mEq daily.  He saw [name], Pharm.D. on October 31, 2017.  He was tolerating his medication well without dizziness or chest pain.  His potassium was still 3.3 checked the day before and he was advised to resume the 20 mg once twice a day of potassium supplementation.  Since his blood pressure was 112/62 [name] was not further titrated.  When I saw him on November 30, 2017 he was doing well on Entresto 49/51 mg twice a day in place of olmesartan.  BP during that evaluation was stable and I added low-dose spironolactone 12.5 mg for aldosterone blockade.  Follow-up bmet on December 14, 2017 showed creatinine of 1.12 and potassium 4.2.  I last saw him on January 11, 2018.  At that time, his blood pressure was 122/78.  I recommended he discontinue doxazosin and further titrated Entresto to 97/103 mg bid.  He has felt improved on maximal therapy.  He underwent a follow-up echo Doppler study on April 11, 2018 which shows slight improvement in ejection fraction now at 25-30.  His left atrium was moderately dilated.  There was mild aortic sclerosis without stenosis.  There was mild dilation of his ascending aorta.  Repeat laboratory in [name] office on March 12, 2018 showed stable renal function with a creatinine of 1.11.  Potassium was 4.4.  He feels well.  He denies PND orthopnea.  He denies chest pain.  He continues to use CPAP with 100% compliance.  He presents for reevaluation.  Past Medical History:  Diagnosis Date     Arthritis    Cancer ([place])    bladder   Confusion    Depression    Dysrhythmia    Edema    feet/ankles   GERD (gastroesophageal reflux disease)    HOH (hard of hearing)    aids   Hypertension    Kidney stones    MVP (mitral valve prolapse)    Prostate disease    Sleep apnea     Past Surgical History:  Procedure Laterality Date   BACK SURGERY     BLADDER SURGERY     cancer   CARDIAC CATHETERIZATION N/A 12/09/2014   Procedure: Left Heart Cath and Coronary Angiography;  Surgeon: Troy Sine, MD;  Location: [facility];  Service: Cardiovascular;  Laterality: N/A;   CATARACT EXTRACTION W/PHACO Right 09/08/2014   Procedure: CATARACT EXTRACTION PHACO AND INTRAOCULAR LENS PLACEMENT (IOC);  Surgeon: Estill Cotta, MD;  Location: [facility];  Service: Ophthalmology;  Laterality: Right;  US:1:06.3 AP%:23.3 CDE:24.97 Fluid lot# [id number]   CEREBRAL ANEURYSM REPAIR     coil and shunt placement   CYSTOSCOPY     litho x 2   EYE SURGERY     KNEE ARTHROSCOPY     WRIST SURGERY     ctr    Allergies  Allergen Reactions   Buspirone Diarrhea   Ceftriaxone Rash    Current Outpatient Medications  Medication Sig Dispense Refill   amLODipine (NORVASC) 10 MG tablet TAKE ONE TABLET BY MOUTH DAILY 90 tablet 1   aspirin 81 MG tablet Take 81 mg by mouth daily.     atorvastatin (LIPITOR) 40 MG tablet Take 40 mg by mouth daily.     calcium citrate (CALCITRATE - DOSED IN MG ELEMENTAL CALCIUM) 950 MG tablet Take 200 mg of elemental calcium by mouth 2 (two) times daily.      carvedilol (COREG) 6.25 MG tablet TAKE ONE TABLET BY MOUTH TWICE A DAY 180 tablet 1   citalopram (CELEXA) 40 MG tablet TAKE 1 TABLET (40 MG TOTAL) BY MOUTH DAILY. 30 tablet 0   Cyanocobalamin (VITAMIN B-12 Schmidt) Take 100 mcg by mouth daily.     finasteride (PROSCAR) 5 MG tablet Take 5 mg by mouth daily.     magnesium oxide (MAG-OX) 400 MG tablet Take 400 mg by mouth daily.      Multiple Vitamins-Minerals (OCUVITE-LUTEIN Schmidt) Take 2 tablets by mouth daily.     omeprazole (PRILOSEC) 20 MG capsule Take 20 mg by mouth daily.     Potassium Chloride ER 20 MEQ TBCR Take 20 mEq by mouth daily. 90 tablet 3   sacubitril-valsartan (ENTRESTO) 97-103 MG Take 1 tablet by mouth 2 (two) times daily. 180 tablet 2   spironolactone (ALDACTONE) 25 MG tablet Take 0.5 tablets (12.5 mg total) by mouth daily. 45 tablet 3   zolpidem (AMBIEN) 5 MG tablet Take 1 tablet (5 mg total) by mouth at bedtime as needed for sleep. 30 tablet 0   No current facility-administered medications for this visit.     Social History   Socioeconomic History   Marital status: Married    Spouse name: Not on file   Number of children: 4   Years of education: 12   Highest education level: Not on file  Occupational History   Occupation: Retired  Scientist, product/process development strain: Not on file   Food insecurity:    Worry: Not on file    Inability: Not on Lexicographer needs:    Medical: Not on file    Non-medical: Not on file  Tobacco Use   Smoking status: Former Smoker   Smokeless tobacco: Current User    Types: Snuff  Substance and Sexual Activity   Alcohol use: No   Drug use: No   Sexual activity: Not on file  Lifestyle   Physical activity:    Days per week: Not on file    Minutes per session: Not on file   Stress: Not on file  Relationships   Social connections:    Talks on phone: Not on file    Gets together: Not on file    Attends religious service: Not on file    Active member of club or organization: Not on file    Attends meetings of clubs or organizations: Not on file    Relationship status: Not on file   Intimate partner violence:  Fear of current or ex partner: Not on file    Emotionally abused: Not on file    Physically abused: Not on file    Forced sexual activity: Not on file  Other Topics Concern   Not on file  Social History  Narrative   Fun: Fish, woodworking   Additional social history is notable that he is married for 42 years.  His 4 children, 9 grandchildren, and 2 great-grandchildren.  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  He completed 12 grades years of education.  He had smoked for 30 years but quit tobacco in 2003.  There is no EtOH use.    Family History  Problem Relation Age of Onset   Heart disease Mother    Prostate cancer Father    Healthy Maternal Grandmother    Healthy Maternal Grandfather    Healthy Paternal Grandmother    Healthy Paternal Grandfather     ROS General: Negative; No fevers, chills, or night sweats HEENT: Negative; No changes in vision or hearing, sinus congestion, difficulty swallowing Pulmonary: Negative; No cough, wheezing, shortness of breath, hemoptysis Cardiovascular:  See HPI;  GI: Negative; No nausea, vomiting, diarrhea, or abdominal pain GU: Remote history of bladder tumor. Musculoskeletal: Negative; no myalgias, joint pain, or weakness Hematologic/Oncologic: Negative; no easy bruising, bleeding Endocrine: Negative; no heat/cold intolerance; no diabetes Neuro: Negative; no changes in balance, headaches Skin: Negative; No rashes or skin lesions Psychiatric: Negative; No behavioral problems, depression Sleep:  Positive for sleep apnea on CPAP ; positive for difficulty with sleep initiation Other comprehensive 14 point system review is negative   Physical Exam BP 119/69    Pulse (!) 51    Ht '5\' 9"'  (1.753 m)    Wt 203 lb 9.6 oz (92.4 kg)    BMI 30.07 kg/m    Repeat blood pressure by me was 138/74 supine and 132/70 standing.  Wt Readings from Last 3 Encounters:  07/07/16 196 lb (88.9 kg)  01/05/16 202 lb (91.6 kg)  07/13/15 202 lb 6 oz (91.8 kg)   General: Alert, oriented, no distress.  Skin: normal turgor, no rashes, warm and dry HEENT: Normocephalic, atraumatic. Pupils equal round and reactive to light; sclera  anicteric; extraocular muscles intact;  Nose without nasal septal hypertrophy Mouth/Parynx benign; Mallinpatti scale 3 Neck: No JVD, no carotid bruits; normal carotid upstroke Lungs: clear to ausculatation and percussion; no wheezing or rales Chest wall: without tenderness to palpitation Heart: PMI not displaced, RRR, s1 s2 normal, 1/6 systolic murmur, no diastolic murmur, no rubs, gallops, thrills, or heaves Abdomen: soft, nontender; no hepatosplenomehaly, BS+; abdominal aorta nontender and not dilated by palpation. Back: no CVA tenderness Pulses 2+ Musculoskeletal: full range of motion, normal strength, no joint deformities Extremities: no clubbing cyanosis or edema, Homan's sign negative  Neurologic: grossly nonfocal; Cranial nerves grossly wnl Psychologic: Normal mood and affect   ECG (independently read by me): Sinus bradycardia 51 bpm.  Left bundle branch block.  First-degree AV block with appeared in with 210 ms.  January 11, 2018 ECG (independently read by me): Sinus bradycardia 52 bpm.  First-degree AV block with appeared normal at 220 ms.  Left bundle branch block.  November 30, 2017 ECG (independently read by me): Sinus bradycardia 57 bpm.  Left bundle branch block with repolarization changes.  Right superior axis.  October 05, 2017 ECG (independently read by me): Sinus bradycardia 53 bpm.  First-degree AV block.  Left bundle branch block with repolarization changes.  July 2019 ECG (independently read by me): Sinus bradycardia 52 bpm with first-degree AV block.  Left bundle branch block.  June 2018 ECG (independently read by me): Sinus bradycardia 53 bpm with first-degree AV block.  PR interval 210 ms.  Left bundle branch block with repolarization changes.  Left axis deviation.  December 2017 ECG (independently read by me): sinus bradycardia 53 bpm with left bundle branch block.  First degree AV block.  December 2016 ECG (independently read by me):  Sinus bradycardia at 56  bpm with first-degree AV block with a PR interval at 216.  Left bundle branch block with repolarization changes.  10/14/2014 ECG (independently read by me): Sinus bradycardia 52 bpm per first-degree AV block with a PR interval of 212 ms.  Left bundle branch block with repolarization changes.  LABS:  BMP Latest Ref Rng & Units 01/29/2018 12/14/2017 11/13/2017  Glucose 65 - 99 mg/dL 89 91 90  BUN 8 - 27 mg/dL '15 18 17  ' Creatinine 0.76 - 1.27 mg/dL 1.14 1.12 1.04  BUN/Creat Ratio 10 - '24 13 16 16  ' Sodium 134 - 144 mmol/L 140 140 143  Potassium 3.5 - 5.2 mmol/L 3.9 4.2 3.7  Chloride 96 - 106 mmol/L 103 102 104  CO2 20 - 29 mmol/L '21 24 25  ' Calcium 8.6 - 10.2 mg/dL 8.8 9.5 9.3     Hepatic Function Latest Ref Rng & Units 01/05/2016 01/19/2015  Total Protein 6.1 - 8.1 g/dL 6.4 6.7  Albumin 3.6 - 5.1 g/dL 3.9 4.0  AST 10 - 35 U/L 17 18  ALT 9 - 46 U/L 16 22  Alk Phosphatase 40 - 115 U/L 93 106  Total Bilirubin 0.2 - 1.2 mg/dL 0.9 0.9    CBC Latest Ref Rng & Units 01/05/2016 01/19/2015 12/02/2014  WBC 3.8 - 10.8 K/uL 5.9 7.6 6.8  Hemoglobin 13.2 - 17.1 g/dL 13.3 13.7 13.7  Hematocrit 38.5 - 50.0 % 40.0 39.9 38.7(L)  Platelets 140 - 400 K/uL 144 176 178   Lab Results  Component Value Date   MCV 89.3 01/05/2016   MCV 87.1 01/19/2015   MCV 85.6 12/02/2014   Lab Results  Component Value Date   TSH 2.79 01/05/2016   No results found for: HGBA1C   BNP No results found for: BNP  ProBNP    Component Value Date/Time   PROBNP 470 01/29/2018 1001     Lipid Panel     Component Value Date/Time   CHOL 98 01/05/2016 0812   TRIG 79 01/05/2016 0812   HDL 44 01/05/2016 0812   CHOLHDL 2.2 01/05/2016 0812   VLDL 16 01/05/2016 0812   LDLCALC 38 01/05/2016 0812    RADIOLOGY: No results found.  IMPRESSION:  1. Cardiomyopathy, ischemic   2. CAD in native artery   3. Essential hypertension   4. LBBB (left bundle branch block)   5. OSA (obstructive sleep apnea)   6. Aortic  root dilation (HCC); mild      ASSESSMENT AND PLAN: Trevor. 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  He was  found to have reduced LV function with lateral dyssynergy most likely contributed by his underlying left bundle branch block.  A follow-up echo Doppler study in August 2018 showed further reduction in LV function  with an EF of 20 to 25%.  There was moderate LVH with  grade 1 diastolic dysfunction.  He had mild AR, mild dilation of his ascending aorta, mitral annular calcification with mild Trevor and moderate left atrial enlargement.  Since his office visit in on October 05, 2017, he has been on Entresto 49./51 mg twice a day in place of olmesartan and subsequently Spironolactone was added to his regimen.  When I last saw him I recommended discontinuance of doxazosin and further titrated Entresto to maximum dosing at the 7/103 normal grams twice daily.  He has felt improved with therapy.  He denies any shortness of breath.  He denies PND orthopnea.  Most recent echo Doppler study shows slight improvement of LV function with EF now at 25 to 30%.  Was mild LVH.  His LV cavity size was normal dimensions.  Recent laboratory shows stable renal function with a creatinine of 1.1 and a potassium of 4.4.  Blood pressure today is stable.  He does not have significant orthostatic drop.  He continues to use CPAP with 100% compliance.  He is unaware of any palpitations.  He denies any chest tightness.  He will be following up with Dr. Loney Loh.  I will see him in 6 months for reevaluation.  Time spent: 25 minutes Troy Sine, MD, [facility] 04/19/2018 2:25 PM

## 2018-05-07 ENCOUNTER — Other Ambulatory Visit: Payer: Self-pay | Admitting: Cardiovascular Disease

## 2018-05-08 NOTE — Telephone Encounter (Signed)
Entresto refilled. 

## 2018-05-23 ENCOUNTER — Other Ambulatory Visit: Payer: Self-pay | Admitting: Cardiovascular Disease

## 2018-05-23 NOTE — Telephone Encounter (Signed)
Amlodipine 10 mg refilled. 

## 2018-06-13 DIAGNOSIS — G4733 Obstructive sleep apnea (adult) (pediatric): Secondary | ICD-10-CM | POA: Diagnosis not present

## 2018-08-06 ENCOUNTER — Other Ambulatory Visit: Payer: Self-pay | Admitting: *Deleted

## 2018-08-06 MED ORDER — ENTRESTO 97-103 MG PO TABS: 1.0000 | ORAL_TABLET | Freq: Two times a day (BID) | ORAL | 2 refills | Status: DC

## 2018-08-06 NOTE — Telephone Encounter (Signed)
Rx(s) sent to pharmacy electronically.  

## 2018-09-11 ENCOUNTER — Other Ambulatory Visit: Payer: Self-pay | Admitting: Cardiovascular Disease

## 2018-09-13 DIAGNOSIS — E7800 Pure hypercholesterolemia, unspecified: Secondary | ICD-10-CM | POA: Diagnosis not present

## 2018-09-13 DIAGNOSIS — Z0000 Encounter for general adult medical examination without abnormal findings: Secondary | ICD-10-CM | POA: Diagnosis not present

## 2018-09-13 DIAGNOSIS — I10 Essential (primary) hypertension: Secondary | ICD-10-CM | POA: Diagnosis not present

## 2018-09-13 DIAGNOSIS — E538 Deficiency of other specified B group vitamins: Secondary | ICD-10-CM | POA: Diagnosis not present

## 2018-09-13 DIAGNOSIS — N400 Enlarged prostate without lower urinary tract symptoms: Secondary | ICD-10-CM | POA: Diagnosis not present

## 2018-09-20 DIAGNOSIS — Z23 Encounter for immunization: Secondary | ICD-10-CM | POA: Diagnosis not present

## 2018-09-20 DIAGNOSIS — I2510 Atherosclerotic heart disease of native coronary artery without angina pectoris: Secondary | ICD-10-CM | POA: Diagnosis not present

## 2018-09-20 DIAGNOSIS — E785 Hyperlipidemia, unspecified: Secondary | ICD-10-CM | POA: Diagnosis not present

## 2018-09-20 DIAGNOSIS — Z0000 Encounter for general adult medical examination without abnormal findings: Secondary | ICD-10-CM | POA: Diagnosis not present

## 2018-09-20 DIAGNOSIS — I10 Essential (primary) hypertension: Secondary | ICD-10-CM | POA: Diagnosis not present

## 2018-09-20 DIAGNOSIS — K219 Gastro-esophageal reflux disease without esophagitis: Secondary | ICD-10-CM | POA: Diagnosis not present

## 2018-09-20 DIAGNOSIS — E538 Deficiency of other specified B group vitamins: Secondary | ICD-10-CM | POA: Diagnosis not present

## 2018-10-09 DIAGNOSIS — G4733 Obstructive sleep apnea (adult) (pediatric): Secondary | ICD-10-CM | POA: Diagnosis not present

## 2018-10-17 ENCOUNTER — Telehealth: Payer: Self-pay | Admitting: Cardiovascular Disease

## 2018-10-17 NOTE — Telephone Encounter (Signed)
FYI for appointment on 9/21

## 2018-10-17 NOTE — Telephone Encounter (Signed)
New Message:    Wife called and said pt has an appt with Dr Claiborne Billings on 10-22-18. She will need to come in with pt for his appointment, , pt have short term memory problems.Trevor Schmidt

## 2018-10-22 ENCOUNTER — Encounter: Payer: Self-pay | Admitting: Cardiovascular Disease

## 2018-10-22 ENCOUNTER — Other Ambulatory Visit: Payer: Self-pay | Admitting: Cardiovascular Disease

## 2018-10-22 ENCOUNTER — Ambulatory Visit (INDEPENDENT_AMBULATORY_CARE_PROVIDER_SITE_OTHER): Payer: PPO | Admitting: Cardiovascular Disease

## 2018-10-22 ENCOUNTER — Other Ambulatory Visit: Payer: Self-pay

## 2018-10-22 DIAGNOSIS — I358 Other nonrheumatic aortic valve disorders: Secondary | ICD-10-CM

## 2018-10-22 DIAGNOSIS — I2510 Atherosclerotic heart disease of native coronary artery without angina pectoris: Secondary | ICD-10-CM

## 2018-10-22 DIAGNOSIS — I255 Ischemic cardiomyopathy: Secondary | ICD-10-CM

## 2018-10-22 DIAGNOSIS — E785 Hyperlipidemia, unspecified: Secondary | ICD-10-CM | POA: Diagnosis not present

## 2018-10-22 DIAGNOSIS — I10 Essential (primary) hypertension: Secondary | ICD-10-CM

## 2018-10-22 DIAGNOSIS — I77810 Thoracic aortic ectasia: Secondary | ICD-10-CM

## 2018-10-22 DIAGNOSIS — I447 Left bundle-branch block, unspecified: Secondary | ICD-10-CM | POA: Diagnosis not present

## 2018-10-22 NOTE — Patient Instructions (Signed)

## 2018-10-22 NOTE — Progress Notes (Signed)
Patient ID: Trevor Schmidt, male   DOB: August 31, 1940, 78 y.o.   MRN: [id number]     Primary: Trevor Po, FNP  HPI:  Trevor Schmidt is a 78 year old white male who established care with me in September 2016.  He presents for 80-monthfollow-up cardiology evaluation.  [name] a history of hypertension, and previous diagnosis of mitral valve prolapse.  Remotely, he had seen [name]  In 2001 he had undergone a cardiac catheterization and was not told of having significant coronary blockage.  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  [place]  He had seen [name]  He has a history of obstructive sleep apnea and has been on CPAP therapy.  He received a new machine in February 2016 after his old machine malfunction.  [facility] reveals that there is also a history of remote PAF, hyperlipidemia, essential hypertension.  He denies any chest pain.  He is unaware of any recent cardiac arrhythmia.  He is using CPAP with 100% compliance.  He also has a history of hyperlipidemia and has been on atorvastatin.    When I initially saw him, I scheduled him for an echo Doppler study and nuclear stress test.  The echo Doppler study showed an ejection fraction of 30% with mild LVH and severe hypokinesis of the septal wall and inferior walls.  There was septal lateral dyssynergy.  There was grade 1 diastolic dysfunction.  There was no evidence for aortic valve stenosis, but there was mild aortic insufficiency.  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  He had mitral annular calcification with trivial MR, mild left atrial dilatation, and mild right atrial dilatation, he underwent a nuclear perfusion study which revealed concordant data with an ejection fraction of 34%.  He has underlying left bundle branch block.  A reversible septal and inferior perfusion defect was  demonstrated.    He underwent cardiac catheterization on 11/28/2014 which revealed CAD with a very short short left main, which immediately bifurcated into the LAD and left circumflex vessel. The LAD had 30% proximal stenosis immediately after the first diagonal branch. The circumflex vessel was a large caliber vessel, and there was extensive collateralization to the distal RCA branches. The RCA immediately gave rise to a conus branch and was tortuous. There was diffuse 50-60% stenoses proximal and medially beyond the acute margin prior to a large PDA vessel. The distal RCA after second small inferior LV branch was totally occluded. There were extensive collateralization to the distal RCA via the left injection.  He was felt that his inferior ischemia seen on the nuclear study was due to his distal RCA occlusion.  Medical therapy was recommended.  In June 2017 a f/u echo Doppler study showed an EF of 35% with Gr I DD.   His aortic root was mildly dioated at 40 mm.  Was aortic valve sclerosis without stenosis.  There was diffuse hypokinesis with septal lateral dyssynergy.  He had laboratory done by [name] February 2019 which showed a BUN of 11 creatinine 0.9.  LFTs were normal.  He continues to be on atorvastatin for hyperlipidemia and recent lipid studies were excellent with a total cholesterol 104, triglycerides 89, LDL 43.  He continues to use CPAP with 100% compliance and has a ResMed AirSense 10 CPAP machine. His DME company is [employer]  He has  remote history of bladder cancer and is followed by Dr. Karsten Ro.    A follow-up echo Doppler study in September 04, 2017  demonstrated severe global LV dysfunction with an EF of 20 to 25%, moderate LVH, grade 1 diastolic dysfunction, mild aortic insufficiency, mild dilation of his a sending aorta, mitral annular calcification with mild MR, and moderate left atrial enlargement.  His EF had decreased since his last evaluation.  He noted shortness of breath with  exertion.  He can walk at a slow pace up to a mile and a half without shortness of breath. He had recent laboratory done by his primary physician and his potassium was low at 3.4.  He was on 20 mill equivalents of potassium twice a day in addition to doxazosin 2 mg, olmesartan 40 mg, amlodipine 10 mg.   When I saw him on October 05, 2017, I recommended transition to [name] and discontinued olmesartan.  I also recommended that he reduce his KCl to 20 mEq daily.  He saw [name], Pharm.D. on October 31, 2017.  He was tolerating his medication well without dizziness or chest pain.  His potassium was still 3.3 checked the day before and he was advised to resume the 20 mg once twice a day of potassium supplementation.  Since his blood pressure was 112/62 [name] was not further titrated.  When I saw him on November 30, 2017 he was doing well on Entresto 49/51 mg twice a day in place of olmesartan.  BP during that evaluation was stable and I added low-dose spironolactone 12.5 mg for aldosterone blockade.  Follow-up bmet on December 14, 2017 showed creatinine of 1.12 and potassium 4.2.  I  saw him on January 11, 2018.  At that time, his blood pressure was 122/78.  I recommended he discontinue doxazosin and further titrated Entresto to 97/103 mg bid.  He has felt improved on maximal therapy.  He underwent a follow-up echo Doppler study on April 11, 2018 which shows slight improvement in ejection fraction now at 25-30.  His left atrium was moderately dilated.  There was mild aortic sclerosis without stenosis.  There was mild dilation of his ascending aorta.  Repeat laboratory in [name] office on March 12, 2018 showed stable renal function with a creatinine of 1.11.  Potassium was 4.4.   I last saw him in March 2020.  At that time he remained stable and denied any symptoms of CHF.  An echo Doppler study on April 11, 2018 continue to show an EF of 25 to 30% with mild concentric LVH and grade 1  diastolic dysfunction.  There was mitral annular calcification and aortic sclerosis with mild AR.  At the time he denied chest pain PND orthopnea or symptoms of CHF.  He was continuing to use CPAP with 100% compliance.   Since I last saw him, he has continued to remain stable.  He walks at least 1-1/2 miles per day chest pain or change in exercise tolerance.  He underwent complete set of laboratory at Dr. Pennie Banter office on September 13, 2018 which remained stable.  Hemoglobin hematocrit were 12.7/37.8.  Leukos was minimally increased at 105.  Renal function was stable with a creatinine of 1.19 and he had normal LFTs.  Lipid studies were excellent with total cholesterol 108, HDL 37, LDL 41 and triglycerides 150.  TSH was normal at 3.17.  He presents for reevaluation    Past Medical History:  Diagnosis Date  . Arthritis   . Cancer ([place])  bladder  . Confusion   . Depression   . Dysrhythmia   . Edema    feet/ankles  . GERD (gastroesophageal reflux disease)   . HOH (hard of hearing)    aids  . Hypertension   . Kidney stones   . MVP (mitral valve prolapse)   . Prostate disease   . Sleep apnea     Past Surgical History:  Procedure Laterality Date  . BACK SURGERY    . BLADDER SURGERY     cancer  . CARDIAC CATHETERIZATION N/A 12/09/2014   Procedure: Left Heart Cath and Coronary Angiography;  Surgeon: Troy Sine, MD;  Location: [facility];  Service: Cardiovascular;  Laterality: N/A;  . CATARACT EXTRACTION W/PHACO Right 09/08/2014   Procedure: CATARACT EXTRACTION PHACO AND INTRAOCULAR LENS PLACEMENT (IOC);  Surgeon: Estill Cotta, MD;  Location: [facility];  Service: Ophthalmology;  Laterality: Right;  US:1:06.3 AP%:23.3 CDE:24.97 Fluid lot# [id number]  . CEREBRAL ANEURYSM REPAIR     coil and shunt placement  . CYSTOSCOPY     litho x 2  . EYE SURGERY    . KNEE ARTHROSCOPY    . WRIST SURGERY     ctr    Allergies  Allergen Reactions  . Buspirone Diarrhea  . Ceftriaxone  Rash    Current Outpatient Medications  Medication Sig Dispense Refill  . amLODipine (NORVASC) 10 MG tablet TAKE ONE TABLET BY MOUTH DAILY 90 tablet 1  . aspirin 81 MG tablet Take 81 mg by mouth daily.    Marland Kitchen atorvastatin (LIPITOR) 40 MG tablet Take 40 mg by mouth daily.    . calcium citrate (CALCITRATE - DOSED IN MG ELEMENTAL CALCIUM) 950 MG tablet Take 200 mg of elemental calcium by mouth 2 (two) times daily.     . carvedilol (COREG) 6.25 MG tablet TAKE 1 TABLET BY MOUTH TWICE A DAY 180 tablet 1  . citalopram (CELEXA) 40 MG tablet TAKE 1 TABLET (40 MG TOTAL) BY MOUTH DAILY. 30 tablet 0  . Cyanocobalamin (VITAMIN B-12 Schmidt) Take 100 mcg by mouth daily.    . finasteride (PROSCAR) 5 MG tablet Take 5 mg by mouth daily.    . magnesium oxide (MAG-OX) 400 MG tablet Take 400 mg by mouth daily.    . Multiple Vitamins-Minerals (OCUVITE-LUTEIN Schmidt) Take 2 tablets by mouth daily.    Marland Kitchen omeprazole (PRILOSEC) 20 MG capsule Take 20 mg by mouth daily.    . Potassium Chloride ER 20 MEQ TBCR Take 20 mEq by mouth daily. 90 tablet 3  . sacubitril-valsartan (ENTRESTO) 97-103 MG Take 1 tablet by mouth 2 (two) times daily. 180 tablet 2  . spironolactone (ALDACTONE) 25 MG tablet Take 0.5 tablets (12.5 mg total) by mouth daily. 45 tablet 3  . zolpidem (AMBIEN) 5 MG tablet Take 1 tablet (5 mg total) by mouth at bedtime as needed for sleep. 30 tablet 0   No current facility-administered medications for this visit.     Social History   Socioeconomic History  . Marital status: Married    Spouse name: Not on file  . Number of children: 4  . Years of education: 41  . Highest education level: Not on file  Occupational History  . Occupation: Retired  Scientific laboratory technician  . Financial resource strain: Not on file  . Food insecurity    Worry: Not on file    Inability: Not on file  . Transportation needs    Medical: Not on file    Non-medical: Not on  file  Tobacco Use  . Smoking status: Former Research scientist (life sciences)  . Smokeless  tobacco: Current User    Types: Snuff  Substance and Sexual Activity  . Alcohol use: No  . Drug use: No  . Sexual activity: Not on file  Lifestyle  . Physical activity    Days per week: Not on file    Minutes per session: Not on file  . Stress: Not on file  Relationships  . Social Herbalist on phone: Not on file    Gets together: Not on file    Attends religious service: Not on file    Active member of club or organization: Not on file    Attends meetings of clubs or organizations: Not on file    Relationship status: Not on file  . Intimate partner violence    Fear of current or ex partner: Not on file    Emotionally abused: Not on file    Physically abused: Not on file    Forced sexual activity: Not on file  Other Topics Concern  . Not on file  Social History Narrative   Fun: Fish, woodworking   Additional social history is notable that he is married for 43 years.  His 4 children, 9 grandchildren, and 2 great-grandchildren.  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  He completed 12 grades years of education.  He had smoked for 30 years but quit tobacco in 2003.  There is no EtOH use.    Family History  Problem Relation Age of Onset  . Heart disease Mother   . Prostate cancer Father   . Healthy Maternal Grandmother   . Healthy Maternal Grandfather   . Healthy Paternal Grandmother   . Healthy Paternal Grandfather     ROS General: Negative; No fevers, chills, or night sweats HEENT: Negative; No changes in vision or hearing, sinus congestion, difficulty swallowing Pulmonary: Negative; No cough, wheezing, shortness of breath, hemoptysis Cardiovascular:  See HPI;  GI: Negative; No nausea, vomiting, diarrhea, or abdominal pain GU: Remote history of bladder tumor. Musculoskeletal: Negative; no myalgias, joint pain, or weakness Hematologic/Oncologic: Negative; no easy bruising, bleeding Endocrine: Negative; no heat/cold intolerance; no  diabetes Neuro: Negative; no changes in balance, headaches Skin: Negative; No rashes or skin lesions Psychiatric: Negative; No behavioral problems, depression Sleep:  Positive for sleep apnea on CPAP ; positive for difficulty with sleep initiation Other comprehensive 14 point system review is negative   Physical Exam BP 118/64   Pulse (!) 57   Ht _0  (1.753 m)   Wt 205 lb (93 kg)   BMI 30.27 kg/m    Repeat blood pressure by me was excellent at 124/68.  Wt Readings from Last 3 Encounters:  07/07/16 196 lb (88.9 kg)  01/05/16 202 lb (91.6 kg)  07/13/15 202 lb 6 oz (91.8 kg)   General: Alert, oriented, no distress.  Skin: normal turgor, no rashes, warm and dry HEENT: Normocephalic, atraumatic. Pupils equal round and reactive to light; sclera anicteric; extraocular muscles intact;  Nose without nasal septal hypertrophy Mouth/Parynx benign; Mallinpatti scale 3 Neck: No JVD, no carotid bruits; normal carotid upstroke Lungs: clear to ausculatation and percussion; no wheezing or rales Chest wall: without tenderness to palpitation Heart: PMI not displaced, RRR, s1 s2 normal, 1/6 systolic murmur, no diastolic murmur, no rubs, gallops, thrills, or heaves Abdomen: soft, nontender; no hepatosplenomehaly, BS+; abdominal aorta nontender and not dilated by palpation. Back: no CVA tenderness Pulses  2+ Musculoskeletal: full range of motion, normal strength, no joint deformities Extremities: no clubbing cyanosis or edema, Homan's sign negative  Neurologic: grossly nonfocal; Cranial nerves grossly wnl Psychologic: Normal mood and affect   ECG (independently read by me): Bradycardia 57 bpm, first-degree degree AV block with PR interval 214 ms; left bundle branch block.  March 2020 ECG (independently read by me): Sinus bradycardia 51 bpm.  Left bundle branch block.  First-degree AV block with appeared in with 210 ms.  January 11, 2018 ECG (independently read by me): Sinus bradycardia 52  bpm.  First-degree AV block with appeared normal at 220 ms.  Left bundle branch block.  November 30, 2017 ECG (independently read by me): Sinus bradycardia 57 bpm.  Left bundle branch block with repolarization changes.  Right superior axis.  October 05, 2017 ECG (independently read by me): Sinus bradycardia 53 bpm.  First-degree AV block.  Left bundle branch block with repolarization changes.  July 2019 ECG (independently read by me): Sinus bradycardia 52 bpm with first-degree AV block.  Left bundle branch block.  June 2018 ECG (independently read by me): Sinus bradycardia 53 bpm with first-degree AV block.  PR interval 210 ms.  Left bundle branch block with repolarization changes.  Left axis deviation.  December 2017 ECG (independently read by me): sinus bradycardia 53 bpm with left bundle branch block.  First degree AV block.  December 2016 ECG (independently read by me):  Sinus bradycardia at 56 bpm with first-degree AV block with a PR interval at 216.  Left bundle branch block with repolarization changes.  10/14/2014 ECG (independently read by me): Sinus bradycardia 52 bpm per first-degree AV block with a PR interval of 212 ms.  Left bundle branch block with repolarization changes.  LABS:  BMP Latest Ref Rng & Units 01/29/2018 12/14/2017 11/13/2017  Glucose 65 - 99 mg/dL 89 91 90  BUN 8 - 27 mg/dL _0 Creatinine 0.76 - 1.27 mg/dL 1.14 1.12 1.04  BUN/Creat Ratio 10 - _1 Sodium 134 - 144 mmol/L 140 140 143  Potassium 3.5 - 5.2 mmol/L 3.9 4.2 3.7  Chloride 96 - 106 mmol/L 103 102 104  CO2 20 - 29 mmol/L _2 Calcium 8.6 - 10.2 mg/dL 8.8 9.5 9.3     Hepatic Function Latest Ref Rng & Units 01/05/2016 01/19/2015  Total Protein 6.1 - 8.1 g/dL 6.4 6.7  Albumin 3.6 - 5.1 g/dL 3.9 4.0  AST 10 - 35 U/L 17 18  ALT 9 - 46 U/L 16 22  Alk Phosphatase 40 - 115 U/L 93 106  Total Bilirubin 0.2 - 1.2 mg/dL 0.9 0.9    CBC Latest Ref Rng & Units 01/05/2016 01/19/2015  12/02/2014  WBC 3.8 - 10.8 K/uL 5.9 7.6 6.8  Hemoglobin 13.2 - 17.1 g/dL 13.3 13.7 13.7  Hematocrit 38.5 - 50.0 % 40.0 39.9 38.7(L)  Platelets 140 - 400 K/uL 144 176 178   Lab Results  Component Value Date   MCV 89.3 01/05/2016   MCV 87.1 01/19/2015   MCV 85.6 12/02/2014   Lab Results  Component Value Date   TSH 2.79 01/05/2016   No results found for: HGBA1C   BNP No results found for: BNP  ProBNP    Component Value Date/Time   PROBNP 470 01/29/2018 1001     Lipid Panel     Component Value Date/Time   CHOL 98 01/05/2016 0812   TRIG 79 01/05/2016 0812   HDL 44  01/05/2016 0812   CHOLHDL 2.2 01/05/2016 0812   VLDL 16 01/05/2016 0812   LDLCALC 38 01/05/2016 0812    RADIOLOGY: No results found.  IMPRESSION:  1. Cardiomyopathy, ischemic   2. CAD in native artery   3. Essential hypertension   4. LBBB (left bundle branch block)   5. Aortic valve sclerosis   6. Hyperlipidemia LDL goal <70   7. Aortic root dilation (HCC); mild      ASSESSMENT AND PLAN: Mr. 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  He was  found to have reduced LV function with lateral dyssynergy most likely contributed by his underlying left bundle branch block.  A follow-up echo Doppler study in August 2018 showed further reduction in LV function  with an EF of 20 to 25%.  There was moderate LVH with grade 1 diastolic dysfunction.  He had mild AR, mild dilation of his ascending aorta, mitral annular calcification with mild MR and moderate left atrial enlargement.  Over the past several office visits, we have maximally titrated his medical regimen for his reduced LV function and he remains entirely asymptomatic on current therapy now consisting of carvedilol 6.25 mg twice a day, Entresto 97/103 mg twice a day,  spironolactone 12.5 mg daily, in addition to his amlodipine 10 mg daily.  His blood pressure today is stable.  Physical exam he is euvolemic.  His ECG is stable and shows sinus bradycardia at 57 bpm with left bundle branch block and mild first-degree AV block.  I reviewed his most recent echo Doppler data from April 11, 2018 which continues to show reduced LV with EF at 25 to 30%, and evidence for mild LVH, grade 1 diastolic dysfunction, mitral annular calcification, as well as aortic sclerosis with mild AR and mild dilation of the ascending aorta measuring 39 mm.  Function I reviewed his laboratory which was recently done at [facility].  He continues to be on atorvastatin 40 mg daily and lipid studies are excellent with an LDL cholesterol at 41.  He continues to be on omeprazole for GERD.  He is on finasteride for prostate issues.  He continues to exercise and walks at least 1 and half miles 7 days a week without chest pain or shortness of breath.  He needs to be on CPAP and admits to 100% compliance.  He is unaware of any breakthrough snoring.  His sleep is restorative.  He will be having a follow-up office visit at [facility] in 6 months.  As long as he remains stable I will see him in 1 year for reevaluation.  Time spent: 25 minutes Troy Sine, MD, [facility] 10/22/2018 9:51 AM

## 2018-10-24 ENCOUNTER — Other Ambulatory Visit: Payer: Self-pay

## 2018-11-09 DIAGNOSIS — Z8551 Personal history of malignant neoplasm of bladder: Secondary | ICD-10-CM | POA: Diagnosis not present

## 2018-11-20 ENCOUNTER — Other Ambulatory Visit: Payer: Self-pay | Admitting: Cardiovascular Disease

## 2018-11-22 ENCOUNTER — Other Ambulatory Visit: Payer: Self-pay | Admitting: Cardiovascular Disease

## 2018-12-11 DIAGNOSIS — H353131 Nonexudative age-related macular degeneration, bilateral, early dry stage: Secondary | ICD-10-CM | POA: Diagnosis not present

## 2019-02-14 DIAGNOSIS — G4733 Obstructive sleep apnea (adult) (pediatric): Secondary | ICD-10-CM | POA: Diagnosis not present

## 2019-02-27 ENCOUNTER — Ambulatory Visit: Payer: PPO

## 2019-03-05 DIAGNOSIS — K136 Irritative hyperplasia of oral mucosa: Secondary | ICD-10-CM | POA: Diagnosis not present

## 2019-03-05 DIAGNOSIS — K1321 Leukoplakia of oral mucosa, including tongue: Secondary | ICD-10-CM | POA: Diagnosis not present

## 2019-03-08 ENCOUNTER — Ambulatory Visit: Payer: PPO | Attending: Internal Medicine

## 2019-03-08 DIAGNOSIS — Z23 Encounter for immunization: Secondary | ICD-10-CM

## 2019-03-08 NOTE — Progress Notes (Signed)
   Covid-19 Vaccination Clinic  Name:  Trevor Schmidt    MRN: [id number] DOB: January 10, 1941  03/08/2019  [name] was observed post Covid-19 immunization for 15 minutes without incidence. He was provided with Vaccine Information Sheet and instruction to access the V-Safe system.   [name] was instructed to call 911 with any severe reactions post vaccine: Marland Kitchen Difficulty breathing  . Swelling of your face and throat  . A fast heartbeat  . A bad rash all over your body  . Dizziness and weakness    Immunizations Administered    Name Date Dose VIS Date Route   Pfizer COVID-19 Vaccine 03/08/2019  8:35 AM 0.3 mL 01/11/2019 Intramuscular   Manufacturer: [place]   Lot: [id number]   [place]: [id number]

## 2019-03-11 ENCOUNTER — Other Ambulatory Visit: Payer: Self-pay | Admitting: Cardiovascular Disease

## 2019-03-11 NOTE — Telephone Encounter (Signed)
Rx has been sent to the pharmacy electronically. ° °

## 2019-03-20 ENCOUNTER — Ambulatory Visit: Payer: PPO

## 2019-03-20 DIAGNOSIS — E785 Hyperlipidemia, unspecified: Secondary | ICD-10-CM | POA: Diagnosis not present

## 2019-03-28 DIAGNOSIS — I129 Hypertensive chronic kidney disease with stage 1 through stage 4 chronic kidney disease, or unspecified chronic kidney disease: Secondary | ICD-10-CM | POA: Diagnosis not present

## 2019-03-28 DIAGNOSIS — K219 Gastro-esophageal reflux disease without esophagitis: Secondary | ICD-10-CM | POA: Diagnosis not present

## 2019-03-28 DIAGNOSIS — I2510 Atherosclerotic heart disease of native coronary artery without angina pectoris: Secondary | ICD-10-CM | POA: Diagnosis not present

## 2019-03-28 DIAGNOSIS — E785 Hyperlipidemia, unspecified: Secondary | ICD-10-CM | POA: Diagnosis not present

## 2019-04-02 ENCOUNTER — Ambulatory Visit: Payer: PPO | Attending: Internal Medicine

## 2019-04-02 DIAGNOSIS — Z23 Encounter for immunization: Secondary | ICD-10-CM

## 2019-04-02 NOTE — Progress Notes (Signed)
   Covid-19 Vaccination Clinic  Name:  Trevor Schmidt    MRN: [id number] DOB: 1940/06/04  04/02/2019  Mr. Trevor Schmidt was observed post Covid-19 immunization for 15 minutes without incident. He was provided with Vaccine Information Sheet and instruction to access the V-Safe system.   Mr. Trevor Schmidt was instructed to call 911 with any severe reactions post vaccine: Marland Kitchen Difficulty breathing  . Swelling of face and throat  . A fast heartbeat  . A bad rash all over body  . Dizziness and weakness   Immunizations Administered    Name Date Dose VIS Date Route   Pfizer COVID-19 Vaccine 04/02/2019  8:39 AM 0.3 mL 01/11/2019 Intramuscular   Manufacturer: [place]   Lot: [id number]   [place]: [id number]

## 2019-04-15 DIAGNOSIS — H903 Sensorineural hearing loss, bilateral: Secondary | ICD-10-CM | POA: Diagnosis not present

## 2019-04-25 DIAGNOSIS — H903 Sensorineural hearing loss, bilateral: Secondary | ICD-10-CM | POA: Diagnosis not present

## 2019-05-19 ENCOUNTER — Other Ambulatory Visit: Payer: Self-pay | Admitting: Cardiovascular Disease

## 2019-06-22 ENCOUNTER — Emergency Department (HOSPITAL_COMMUNITY): Payer: PPO

## 2019-06-22 ENCOUNTER — Encounter (HOSPITAL_COMMUNITY): Payer: Self-pay | Admitting: Emergency Medicine

## 2019-06-22 ENCOUNTER — Observation Stay (HOSPITAL_COMMUNITY)
Admission: EM | Admit: 2019-06-22 | Discharge: 2019-06-23 | Disposition: A | Discharge status: Home / Self Care | Payer: PPO | Attending: Internal Medicine | Admitting: Internal Medicine

## 2019-06-22 ENCOUNTER — Other Ambulatory Visit: Payer: Self-pay

## 2019-06-22 DIAGNOSIS — Z9989 Dependence on other enabling machines and devices: Secondary | ICD-10-CM

## 2019-06-22 DIAGNOSIS — R079 Chest pain, unspecified: Secondary | ICD-10-CM | POA: Diagnosis not present

## 2019-06-22 DIAGNOSIS — I2510 Atherosclerotic heart disease of native coronary artery without angina pectoris: Secondary | ICD-10-CM | POA: Diagnosis present

## 2019-06-22 DIAGNOSIS — Z87891 Personal history of nicotine dependence: Secondary | ICD-10-CM | POA: Insufficient documentation

## 2019-06-22 DIAGNOSIS — I509 Heart failure, unspecified: Secondary | ICD-10-CM | POA: Diagnosis not present

## 2019-06-22 DIAGNOSIS — Z79899 Other long term (current) drug therapy: Secondary | ICD-10-CM | POA: Diagnosis not present

## 2019-06-22 DIAGNOSIS — Z888 Allergy status to other drugs, medicaments and biological substances status: Secondary | ICD-10-CM | POA: Diagnosis not present

## 2019-06-22 DIAGNOSIS — I10 Essential (primary) hypertension: Secondary | ICD-10-CM | POA: Diagnosis not present

## 2019-06-22 DIAGNOSIS — N400 Enlarged prostate without lower urinary tract symptoms: Secondary | ICD-10-CM | POA: Diagnosis present

## 2019-06-22 DIAGNOSIS — I080 Rheumatic disorders of both mitral and aortic valves: Secondary | ICD-10-CM | POA: Diagnosis not present

## 2019-06-22 DIAGNOSIS — Z8249 Family history of ischemic heart disease and other diseases of the circulatory system: Secondary | ICD-10-CM | POA: Insufficient documentation

## 2019-06-22 DIAGNOSIS — I110 Hypertensive heart disease with heart failure: Secondary | ICD-10-CM | POA: Insufficient documentation

## 2019-06-22 DIAGNOSIS — Z961 Presence of intraocular lens: Secondary | ICD-10-CM | POA: Insufficient documentation

## 2019-06-22 DIAGNOSIS — E785 Hyperlipidemia, unspecified: Secondary | ICD-10-CM | POA: Diagnosis present

## 2019-06-22 DIAGNOSIS — G4733 Obstructive sleep apnea (adult) (pediatric): Secondary | ICD-10-CM

## 2019-06-22 DIAGNOSIS — R112 Nausea with vomiting, unspecified: Secondary | ICD-10-CM

## 2019-06-22 DIAGNOSIS — I341 Nonrheumatic mitral (valve) prolapse: Secondary | ICD-10-CM

## 2019-06-22 DIAGNOSIS — Z8679 Personal history of other diseases of the circulatory system: Secondary | ICD-10-CM | POA: Insufficient documentation

## 2019-06-22 DIAGNOSIS — R0789 Other chest pain: Principal | ICD-10-CM | POA: Insufficient documentation

## 2019-06-22 DIAGNOSIS — I429 Cardiomyopathy, unspecified: Secondary | ICD-10-CM

## 2019-06-22 DIAGNOSIS — F329 Major depressive disorder, single episode, unspecified: Secondary | ICD-10-CM | POA: Diagnosis not present

## 2019-06-22 DIAGNOSIS — I499 Cardiac arrhythmia, unspecified: Secondary | ICD-10-CM | POA: Diagnosis not present

## 2019-06-22 DIAGNOSIS — Z7982 Long term (current) use of aspirin: Secondary | ICD-10-CM | POA: Diagnosis not present

## 2019-06-22 DIAGNOSIS — N401 Enlarged prostate with lower urinary tract symptoms: Secondary | ICD-10-CM

## 2019-06-22 DIAGNOSIS — M1990 Unspecified osteoarthritis, unspecified site: Secondary | ICD-10-CM | POA: Insufficient documentation

## 2019-06-22 DIAGNOSIS — R9431 Abnormal electrocardiogram [ECG] [EKG]: Secondary | ICD-10-CM | POA: Diagnosis not present

## 2019-06-22 DIAGNOSIS — K219 Gastro-esophageal reflux disease without esophagitis: Secondary | ICD-10-CM | POA: Diagnosis not present

## 2019-06-22 DIAGNOSIS — I255 Ischemic cardiomyopathy: Secondary | ICD-10-CM | POA: Diagnosis present

## 2019-06-22 DIAGNOSIS — I712 Thoracic aortic aneurysm, without rupture: Secondary | ICD-10-CM | POA: Diagnosis not present

## 2019-06-22 DIAGNOSIS — I447 Left bundle-branch block, unspecified: Secondary | ICD-10-CM | POA: Diagnosis present

## 2019-06-22 DIAGNOSIS — Z9841 Cataract extraction status, right eye: Secondary | ICD-10-CM | POA: Diagnosis not present

## 2019-06-22 DIAGNOSIS — I119 Hypertensive heart disease without heart failure: Secondary | ICD-10-CM | POA: Diagnosis present

## 2019-06-22 DIAGNOSIS — Z20822 Contact with and (suspected) exposure to covid-19: Secondary | ICD-10-CM | POA: Diagnosis not present

## 2019-06-22 DIAGNOSIS — Z881 Allergy status to other antibiotic agents status: Secondary | ICD-10-CM | POA: Insufficient documentation

## 2019-06-22 LAB — CBC
HCT: 41.2 % (ref 39.0–52.0)
Hemoglobin: 13.6 g/dL (ref 13.0–17.0)
MCH: 30.3 pg (ref 26.0–34.0)
MCHC: 33 g/dL (ref 30.0–36.0)
MCV: 91.8 fL (ref 80.0–100.0)
Platelets: 180 10*3/uL (ref 150–400)
RBC: 4.49 MIL/uL (ref 4.22–5.81)
RDW: 12.4 % (ref 11.5–15.5)
WBC: 10 10*3/uL (ref 4.0–10.5)
nRBC: 0 % (ref 0.0–0.2)

## 2019-06-22 LAB — URINALYSIS, ROUTINE W REFLEX MICROSCOPIC
Bilirubin Urine: NEGATIVE
Glucose, UA: NEGATIVE mg/dL
Hgb urine dipstick: NEGATIVE
Ketones, ur: NEGATIVE mg/dL
Leukocytes,Ua: NEGATIVE
Nitrite: NEGATIVE
Protein, ur: NEGATIVE mg/dL
Specific Gravity, Urine: 1.012 (ref 1.005–1.030)
pH: 7 (ref 5.0–8.0)

## 2019-06-22 LAB — COMPREHENSIVE METABOLIC PANEL
ALT: 21 U/L (ref 0–44)
AST: 20 U/L (ref 15–41)
Albumin: 4 g/dL (ref 3.5–5.0)
Alkaline Phosphatase: 90 U/L (ref 38–126)
Anion gap: 10 (ref 5–15)
BUN: 20 mg/dL (ref 8–23)
CO2: 23 mmol/L (ref 22–32)
Calcium: 9.1 mg/dL (ref 8.9–10.3)
Chloride: 107 mmol/L (ref 98–111)
Creatinine, Ser: 1.06 mg/dL (ref 0.61–1.24)
GFR calc Af Amer: >60 mL/min (ref >60)
GFR calc non Af Amer: >60 mL/min (ref >60)
Glucose, Bld: 138 mg/dL — ABNORMAL HIGH (ref 70–99)
Potassium: 3.8 mmol/L (ref 3.5–5.1)
Sodium: 140 mmol/L (ref 135–145)
Total Bilirubin: 0.8 mg/dL (ref 0.3–1.2)
Total Protein: 7.1 g/dL (ref 6.5–8.1)

## 2019-06-22 LAB — PROTIME-INR
INR: 1 (ref 0.8–1.2)
Prothrombin Time: 12.6 seconds (ref 11.4–15.2)

## 2019-06-22 LAB — MAGNESIUM: Magnesium: 2.1 mg/dL (ref 1.7–2.4)

## 2019-06-22 LAB — HEMOGLOBIN A1C
Hgb A1c MFr Bld: 5.8 % — ABNORMAL HIGH (ref 4.8–5.6)
Mean Plasma Glucose: 119.76 mg/dL

## 2019-06-22 LAB — TROPONIN I (HIGH SENSITIVITY): Troponin I (High Sensitivity): 8 ng/L (ref <18)

## 2019-06-22 LAB — I-STAT CHEM 8, ED
BUN: 20 mg/dL (ref 8–23)
Calcium, Ion: 1.16 mmol/L (ref 1.15–1.40)
Chloride: 102 mmol/L (ref 98–111)
Creatinine, Ser: 0.9 mg/dL (ref 0.61–1.24)
Glucose, Bld: 128 mg/dL — ABNORMAL HIGH (ref 70–99)
HCT: 38 % — ABNORMAL LOW (ref 39.0–52.0)
Hemoglobin: 12.9 g/dL — ABNORMAL LOW (ref 13.0–17.0)
Potassium: 3.9 mmol/L (ref 3.5–5.1)
Sodium: 138 mmol/L (ref 135–145)
TCO2: 24 mmol/L (ref 22–32)

## 2019-06-22 LAB — LIPASE, BLOOD: Lipase: 28 U/L (ref 11–51)

## 2019-06-22 LAB — CBG MONITORING, ED: Glucose-Capillary: 125 mg/dL — ABNORMAL HIGH (ref 70–99)

## 2019-06-22 LAB — SARS CORONAVIRUS 2 BY RT PCR (HOSPITAL ORDER, PERFORMED IN ~~LOC~~ HOSPITAL LAB): SARS Coronavirus 2: NEGATIVE

## 2019-06-22 MED ORDER — FINASTERIDE 5 MG PO TABS
5.0000 mg | ORAL_TABLET | Freq: Every day | ORAL | Status: DC
  Administered 2019-06-23: 5 mg via ORAL
  Filled 2019-06-22: qty 1

## 2019-06-22 MED ORDER — MORPHINE SULFATE (PF) 2 MG/ML IV SOLN
2.0000 mg | INTRAVENOUS | Status: AC | PRN
  Administered 2019-06-22: 2 mg via INTRAVENOUS
  Filled 2019-06-22: qty 1

## 2019-06-22 MED ORDER — ONDANSETRON HCL 4 MG/2ML IJ SOLN: 4.0000 mg | Freq: Four times a day (QID) | INTRAMUSCULAR | Status: DC | PRN

## 2019-06-22 MED ORDER — ENOXAPARIN SODIUM 40 MG/0.4ML ~~LOC~~ SOLN
40.0000 mg | Freq: Every day | SUBCUTANEOUS | Status: DC
  Administered 2019-06-23: 40 mg via SUBCUTANEOUS
  Filled 2019-06-22: qty 0.4

## 2019-06-22 MED ORDER — MORPHINE SULFATE (PF) 4 MG/ML IV SOLN: 4.0000 mg | Freq: Once | INTRAVENOUS | Status: DC

## 2019-06-22 MED ORDER — ACETAMINOPHEN 325 MG PO TABS: 650.0000 mg | ORAL_TABLET | ORAL | Status: DC | PRN

## 2019-06-22 MED ORDER — CARVEDILOL 6.25 MG PO TABS
6.2500 mg | ORAL_TABLET | Freq: Two times a day (BID) | ORAL | Status: DC
  Administered 2019-06-22 – 2019-06-23 (×2): 6.25 mg via ORAL
  Filled 2019-06-22 (×2): qty 1

## 2019-06-22 MED ORDER — NITROGLYCERIN 0.4 MG SL SUBL
0.4000 mg | SUBLINGUAL_TABLET | SUBLINGUAL | Status: DC | PRN
  Administered 2019-06-22 (×3): 0.4 mg via SUBLINGUAL
  Filled 2019-06-22: qty 1

## 2019-06-22 MED ORDER — ATORVASTATIN CALCIUM 40 MG PO TABS
40.0000 mg | ORAL_TABLET | Freq: Every day | ORAL | Status: DC
  Administered 2019-06-23: 40 mg via ORAL
  Filled 2019-06-22: qty 1

## 2019-06-22 MED ORDER — NITROGLYCERIN 2 % TD OINT
0.5000 [in_us] | TOPICAL_OINTMENT | Freq: Once | TRANSDERMAL | Status: AC
  Administered 2019-06-23: 0.5 [in_us] via TOPICAL
  Filled 2019-06-22: qty 1

## 2019-06-22 MED ORDER — ONDANSETRON HCL 4 MG/2ML IJ SOLN
4.0000 mg | Freq: Once | INTRAMUSCULAR | Status: AC
  Administered 2019-06-22: 4 mg via INTRAVENOUS
  Filled 2019-06-22: qty 2

## 2019-06-22 MED ORDER — PANTOPRAZOLE SODIUM 40 MG PO TBEC
40.0000 mg | DELAYED_RELEASE_TABLET | Freq: Every day | ORAL | Status: DC
  Administered 2019-06-23: 40 mg via ORAL
  Filled 2019-06-22: qty 1

## 2019-06-22 MED ORDER — SPIRONOLACTONE 12.5 MG HALF TABLET
12.5000 mg | ORAL_TABLET | Freq: Every day | ORAL | Status: DC
  Administered 2019-06-23: 12.5 mg via ORAL
  Filled 2019-06-22: qty 1

## 2019-06-22 MED ORDER — ASPIRIN EC 81 MG PO TBEC
81.0000 mg | DELAYED_RELEASE_TABLET | Freq: Every day | ORAL | Status: DC
  Administered 2019-06-23: 81 mg via ORAL
  Filled 2019-06-22: qty 1

## 2019-06-22 MED ORDER — AMLODIPINE BESYLATE 10 MG PO TABS
10.0000 mg | ORAL_TABLET | Freq: Every day | ORAL | Status: DC
  Administered 2019-06-23: 10 mg via ORAL
  Filled 2019-06-22: qty 1

## 2019-06-22 MED ORDER — ZOLPIDEM TARTRATE 5 MG PO TABS: 5.0000 mg | ORAL_TABLET | Freq: Every evening | ORAL | Status: DC | PRN

## 2019-06-22 MED ORDER — CITALOPRAM HYDROBROMIDE 40 MG PO TABS
40.0000 mg | ORAL_TABLET | Freq: Every day | ORAL | Status: DC
  Administered 2019-06-23: 40 mg via ORAL
  Filled 2019-06-22: qty 1

## 2019-06-22 MED ORDER — MORPHINE SULFATE (PF) 4 MG/ML IV SOLN: 4.0000 mg | Freq: Once | INTRAVENOUS | Status: DC | PRN

## 2019-06-22 MED ORDER — SACUBITRIL-VALSARTAN 97-103 MG PO TABS
1.0000 | ORAL_TABLET | Freq: Two times a day (BID) | ORAL | Status: DC
  Administered 2019-06-23: 1 via ORAL
  Filled 2019-06-22 (×2): qty 1

## 2019-06-22 MED ORDER — MAGNESIUM OXIDE 400 (241.3 MG) MG PO TABS
400.0000 mg | ORAL_TABLET | Freq: Every day | ORAL | Status: DC
  Administered 2019-06-23: 400 mg via ORAL
  Filled 2019-06-22: qty 1

## 2019-06-22 NOTE — H&P (Addendum)
[facility] History and Physical  Trevor Schmidt [id number] DOB: 1940/05/02 DOA: 06/22/2019  Referring EDP: Doren Custard PCP: Deland Pretty, MD   Chief Complaint: Chest pain  HPI: Trevor Schmidt is a 79 y.o. male with PMH of HTN, HLD, CHF and CAD who presented with chest pain and admitted for ACS rule-out.  Patient reports feeling normal today until this evening after eating dinner when he noted pain across lower chest when he took the dog out to use the restroom. He initially thought it was indigestion and took some reflux meds without relief. He then began to have nausea and vomiting. Took Aspirin but vomited this. Rated pain at 7/10. Called EMS and was given Nitro which helped with pain. Denies pain like this previously. Wife reports that he looks pale. Denies diaphoresis. Pain did not radiate. Pain is constant and still present but rating at 3/10 currently. Non-radiating, non-pleuritic. Denies headache, dizziness, fever, chills, cough, SOB, abdominal pain, diarrhea, constipation, dysuria, hematuria, hematochezia, melena, difficulty moving arms/legs, speech difficulty, trouble eating, confusion or any other complaints.  In the ED: Vitals stable on room air. Labs remarkable for CBC, Lipase, Mag, CMP WNL. Initial Trop 8. UA without evidence of infection.   CXR: Streaky basilar opacities favored to represent atelectasis. Intact right chest wall shunt catheter tubing.  Patient was given Morphine and Nitro and called for admission for ACS rule-out.  Review of Systems:  All other systems negative unless noted above in HPI.   Past Medical History:  Diagnosis Date  . Acute bronchitis 02/04/2015  . Arthritis   . Cancer ([place])    bladder  . Confusion   . Depression   . Dysrhythmia   . Edema    feet/ankles  . GERD (gastroesophageal reflux disease)   . HOH (hard of hearing)    aids  . Hypertension   . Kidney stones   . MVP (mitral valve prolapse)   . Prostate disease   . Sleep apnea     Past Surgical History:  Procedure Laterality Date  . BACK SURGERY    . BLADDER SURGERY     cancer  . CARDIAC CATHETERIZATION N/A 12/09/2014   Procedure: Left Heart Cath and Coronary Angiography;  Surgeon: Troy Sine, MD;  Location: [place] [facility];  Service: Cardiovascular;  Laterality: N/A;  . CATARACT EXTRACTION W/PHACO Right 09/08/2014   Procedure: CATARACT EXTRACTION PHACO AND INTRAOCULAR LENS PLACEMENT (IOC);  Surgeon: Estill Cotta, MD;  Location: [facility];  Service: Ophthalmology;  Laterality: Right;  US:1:06.3 AP%:23.3 CDE:24.97 Fluid lot# [id number]  . CEREBRAL ANEURYSM REPAIR     coil and shunt placement  . CYSTOSCOPY     litho x 2  . EYE SURGERY    . KNEE ARTHROSCOPY    . WRIST SURGERY     ctr   Social History:  reports that he has quit smoking. His smokeless tobacco use includes snuff. He reports that he does not drink alcohol or use drugs.  Allergies  Allergen Reactions  . Buspirone Diarrhea  . Ceftriaxone Rash    Family History  Problem Relation Age of Onset  . Heart disease Mother   . Prostate cancer Father   . Healthy Maternal Grandmother   . Healthy Maternal Grandfather   . Healthy Paternal Grandmother   . Healthy Paternal Grandfather      Prior to Admission medications   Medication Sig Start Date End Date Taking? Authorizing Provider  amLODipine (NORVASC) 10 MG tablet TAKE ONE TABLET BY MOUTH DAILY  05/20/19   Troy Sine, MD  aspirin 81 MG tablet Take 81 mg by mouth daily.    [provider]  atorvastatin (LIPITOR) 40 MG tablet Take 40 mg by mouth daily.    [provider]  calcium citrate (CALCITRATE - DOSED IN MG ELEMENTAL CALCIUM) 950 MG tablet Take 200 mg of elemental calcium by mouth 2 (two) times daily.     [provider]  carvedilol (COREG) 6.25 MG tablet TAKE ONE TABLET BY MOUTH TWICE A DAY 03/11/19   Troy Sine, MD  citalopram (CELEXA) 40 MG tablet TAKE 1 TABLET (40 MG TOTAL) BY MOUTH DAILY. 03/12/15    Golden Circle, FNP  Cyanocobalamin (VITAMIN B-12 PO) Take 100 mcg by mouth daily.    [provider]  finasteride (PROSCAR) 5 MG tablet Take 5 mg by mouth daily.    [provider]  magnesium oxide (MAG-OX) 400 MG tablet Take 400 mg by mouth daily.    [provider]  Multiple Vitamins-Minerals (OCUVITE-LUTEIN PO) Take 2 tablets by mouth daily.    [provider]  omeprazole (PRILOSEC) 20 MG capsule Take 20 mg by mouth daily.    [provider]  Potassium Chloride ER 20 MEQ TBCR TAKE ONE TABLET BY MOUTH TWICE A DAY 10/23/18   Troy Sine, MD  sacubitril-valsartan (ENTRESTO) 97-103 MG Take 1 tablet by mouth 2 (two) times daily. 08/06/18   Troy Sine, MD  spironolactone (ALDACTONE) 25 MG tablet TAKE 1/2 TABLET BY MOUTH DAILY 11/21/18   Troy Sine, MD  zolpidem (AMBIEN) 5 MG tablet Take 1 tablet (5 mg total) by mouth at bedtime as needed for sleep. 01/19/15   Troy Sine, MD   Physical Exam: Vitals:   06/22/19 2130 06/22/19 2145 06/22/19 2245 06/22/19 2300  BP: 122/71 121/72 120/61 111/61  Pulse: (!) 57 (!) 52 (!) 51 (!) 54  Resp: (!) 26 13 (!) 21 17  Temp:      TempSrc:      SpO2: 96% 97% 94% 95%  Weight:      Height:        Wt Readings from Last 3 Encounters:  06/22/19 102 kg  10/22/18 93 kg  04/19/18 92.4 kg    . General:  Appears calm and comfortable. AAOx4.  . Eyes: EOMI, normal lids, irises & conjunctiva . ENT: grossly normal hearing, lips & tongue . Neck: normal ROM . Cardiovascular: RRR, no m/r/g. No LE edema. Chest wall non-tender to palpation. Marland Kitchen Respiratory: CTA bilaterally, no w/r/r. Normal respiratory effort. . Abdomen: soft, ntnd . Skin: no rash or induration seen on limited exam . Musculoskeletal: grossly normal tone BUE/BLE . Psychiatric: grossly normal mood and affect, speech fluent and appropriate . Neurologic: grossly non-focal.          Labs on Admission:  Basic Metabolic Panel: Recent Labs    Lab 06/22/19 2125 06/22/19 2141  NA 140 138  K 3.8 3.9  CL 107 102  CO2 23  --   GLUCOSE 138* 128*  BUN 20 20  CREATININE 1.06 0.90  CALCIUM 9.1  --   MG 2.1  --    Liver Function Tests: Recent Labs  Lab 06/22/19 2125  AST 20  ALT 21  ALKPHOS 90  BILITOT 0.8  PROT 7.1  ALBUMIN 4.0   Recent Labs  Lab 06/22/19 2125  LIPASE 28   No results for input(s): AMMONIA in the last 168 hours. CBC: Recent Labs  Lab  06/22/19 2125 06/22/19 2141  WBC 10.0  --   HGB 13.6 12.9*  HCT 41.2 38.0*  MCV 91.8  --   PLT 180  --    Cardiac Enzymes: No results for input(s): CKTOTAL, CKMB, CKMBINDEX, TROPONINI in the last 168 hours.  BNP (last 3 results) No results for input(s): BNP in the last 8760 hours.  ProBNP (last 3 results) No results for input(s): PROBNP in the last 8760 hours.  CBG: Recent Labs  Lab 06/22/19 2139  GLUCAP 125*    Radiological Exams on Admission: DG Chest Portable 1 View  Result Date: 06/22/2019 CLINICAL DATA:  Chest pain EXAM: PORTABLE CHEST 1 VIEW COMPARISON:  Radiograph 12/02/2014 FINDINGS: Streaky basilar opacities without focal consolidation, pneumothorax or effusion. Pulmonary vascularity remains fairly well-defined. Stable borderline cardiomegaly. The cardiomediastinal contours are otherwise unremarkable. No acute osseous or soft tissue abnormality. Stable contiguous or right chest wall shunt catheter tubing without discontinuity. Telemetry leads overlie the chest. IMPRESSION: Streaky basilar opacities favored to represent atelectasis. Intact right chest wall shunt catheter tubing. Electronically Signed   By: Lovena Le M.D.   On: 06/22/2019 21:41    EKG: Independently reviewed. HR 50. Sinus rhythm. QTc 480. No STEMI. LBBB which is unchanged to prior.   Assessment/Plan Principal Problem:   Chest pain Active Problems:   Mitral valve prolapse   Essential hypertension   BPH (benign prostatic hyperplasia)   Hyperlipidemia   LBBB (left bundle  branch block)   Hyperlipidemia LDL goal <70   OSA on CPAP   CAD in native artery   Cardiomyopathy, ischemic   Hypertensive cardiovascular disease  79 y.o. male with PMH of HTN, HLD, CHF and CAD who presented with chest pain and admitted for ACS rule-out.  Chest Pain ACS Rule-out - pain across lower chest after dinner; non-radiating/non-pleuritic/non-reproducible on exam - improved with Nitro but still present - no smoking or alcohol use hx - no hx MI - last cath in 2016: Mid LAD lesion, 30% stenosed. Dist RCA-1 lesion, 50% stenosed. Dist RCA-2 lesion, 65% stenosed. Post Atrio lesion, 100% stenosed. - NPO - Repeat Echo ordered - Tele - Likely needs Cards consult in AM - Trend Trops; neg thus far - Daily ASA - Heart Score 5-6 - PRN Nitro - Morphine PRN - A1c and Lipid panel ordered for risk stratification - cont PPI although less likely GERD by history    CHF CAD HTN - last Echo in March 2020 with EF 25-30% - Known Mitral Valve Prolapse - cont Spironolactone, Entresto, Coreg, Norvasc - I's and O's - Daily K, Mag - Daily Weights - Weight stable per patient  - repeat Echo  HLD - cont Lipitor - repeat Lipid panel ordered  BPH - cont Proscar  MDD Insomnia - cont Ambien and Celexa   Code Status: Full DVT Prophylaxis: Lovenox Family Communication: Wife at bedside Disposition Plan: Admit to Obs with Tele for ACS rule-out. Requiring further monitoring and likely specialty consult. If workup negative, possible DC home tomorrow.    Time spent: 50 minutes  Chauncey Mann, MD [facility] Pager [phone]

## 2019-06-22 NOTE — ED Provider Notes (Signed)
[facility] EMERGENCY DEPARTMENT Provider Note   CSN: [id number] Arrival date & time: 06/22/19  2106     History Chief Complaint  Patient presents with  . Chest Pain    Trevor Schmidt is a 79 y.o. male.  HPI Patient is a 79 year old male with history of hypertension, mitral valve prolapse, and OSA who presents for the acute onset of severe bilateral, lower chest pain at 645 this evening.  He had eaten dinner an hour prior.  At time of onset, he was at rest.  His wife reports that he suddenly looked ashen.  He subsequently had vomiting.  EMS was called.  He continued to vomit which he says is very rare for him.  He cannot remember the last time he threw up.  EMS provided 324 ASA, and 2 SL NTG's.  After second dose of NTG, he reported improvement in his pain from 7/10 in severity to 4/10 in severity.  Initial blood pressure with EMS was 180 SBP, subsequent blood pressure readings were in the range of 120 SBP.  Upon arrival in the ED, patient endorses mild chest pain, similar in characteristics to time of onset.  He denies any current nausea.  He reports that prior to the onset of pain, he was feeling well.  He has been active over the past few days, although he has been noticing recent increase in exertional dyspnea.  Last echo was 15 months ago.  At that time EF was 25 to 30%.  RV had normal function.  Home medications include spironolactone, Entresto, Prilosec, Coreg, Lipitor, and Norvasc.    Past Medical History:  Diagnosis Date  . Acute bronchitis 02/04/2015  . Arthritis   . Cancer ([place])    bladder  . Confusion   . Depression   . Dysrhythmia   . Edema    feet/ankles  . GERD (gastroesophageal reflux disease)   . HOH (hard of hearing)    aids  . Hypertension   . Kidney stones   . MVP (mitral valve prolapse)   . Prostate disease   . Sleep apnea     Patient Active Problem List   Diagnosis Date Noted  . Chest pain 06/22/2019  . Cardiomyopathy, ischemic  07/13/2015  . 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  . CAD in native artery   . Hyperlipidemia LDL goal <70 11/28/2014  . OSA on CPAP 11/28/2014  . Hyperlipidemia 10/16/2014  . LBBB (left bundle branch block) 10/16/2014  . Mitral valve prolapse 10/07/2014  . Essential hypertension 10/07/2014  . BPH (benign prostatic hyperplasia) 10/07/2014    Past Surgical History:  Procedure Laterality Date  . BACK SURGERY    . BLADDER SURGERY     cancer  . CARDIAC CATHETERIZATION N/A 12/09/2014   Procedure: Left Heart Cath and Coronary Angiography;  Surgeon: Troy Sine, MD;  Location: [place] [facility];  Service: Cardiovascular;  Laterality: N/A;  . CATARACT EXTRACTION W/PHACO Right 09/08/2014   Procedure: CATARACT EXTRACTION PHACO AND INTRAOCULAR LENS PLACEMENT (IOC);  Surgeon: Estill Cotta, MD;  Location: [facility];  Service: Ophthalmology;  Laterality: Right;  US:1:06.3 AP%:23.3 CDE:24.97 Fluid lot# [id number]  . CEREBRAL ANEURYSM REPAIR     coil and shunt placement  . CYSTOSCOPY     litho x 2  . EYE SURGERY    . KNEE ARTHROSCOPY    . WRIST SURGERY     ctr       Family History  Problem Relation Age of Onset  . Heart  disease Mother   . Prostate cancer Father   . Healthy Maternal Grandmother   . Healthy Maternal Grandfather   . Healthy Paternal Grandmother   . Healthy Paternal Grandfather     Social History   Tobacco Use  . Smoking status: Former Research scientist (life sciences)  . Smokeless tobacco: Current User    Types: Snuff  Substance Use Topics  . Alcohol use: No  . Drug use: No    Home Medications Prior to Admission medications   Medication Sig Start Date End Date Taking? Authorizing Provider  amLODipine (NORVASC) 10 MG tablet TAKE ONE TABLET BY MOUTH DAILY 05/20/19   Troy Sine, MD  aspirin 81 MG tablet Take 81 mg by mouth daily.    [provider]  atorvastatin (LIPITOR) 40 MG tablet Take 40 mg by mouth daily.    [provider]  calcium citrate  (CALCITRATE - DOSED IN MG ELEMENTAL CALCIUM) 950 MG tablet Take 200 mg of elemental calcium by mouth 2 (two) times daily.     [provider]  carvedilol (COREG) 6.25 MG tablet TAKE ONE TABLET BY MOUTH TWICE A DAY 03/11/19   Troy Sine, MD  citalopram (CELEXA) 40 MG tablet TAKE 1 TABLET (40 MG TOTAL) BY MOUTH DAILY. 03/12/15   Golden Circle, FNP  Cyanocobalamin (VITAMIN B-12 PO) Take 100 mcg by mouth daily.    [provider]  finasteride (PROSCAR) 5 MG tablet Take 5 mg by mouth daily.    [provider]  magnesium oxide (MAG-OX) 400 MG tablet Take 400 mg by mouth daily.    [provider]  Multiple Vitamins-Minerals (OCUVITE-LUTEIN PO) Take 2 tablets by mouth daily.    [provider]  omeprazole (PRILOSEC) 20 MG capsule Take 20 mg by mouth daily.    [provider]  Potassium Chloride ER 20 MEQ TBCR TAKE ONE TABLET BY MOUTH TWICE A DAY 10/23/18   Troy Sine, MD  sacubitril-valsartan (ENTRESTO) 97-103 MG Take 1 tablet by mouth 2 (two) times daily. 08/06/18   Troy Sine, MD  spironolactone (ALDACTONE) 25 MG tablet TAKE 1/2 TABLET BY MOUTH DAILY 11/21/18   Troy Sine, MD  zolpidem (AMBIEN) 5 MG tablet Take 1 tablet (5 mg total) by mouth at bedtime as needed for sleep. 01/19/15   Troy Sine, MD    Allergies    Buspirone and Ceftriaxone  Review of Systems   Review of Systems  Constitutional: Negative.  Negative for chills and fever.  HENT: Negative.  Negative for ear pain and sore throat.   Eyes: Negative.  Negative for pain and visual disturbance.  Respiratory: Positive for shortness of breath (exertional). Negative for cough.   Cardiovascular: Positive for chest pain. Negative for palpitations.  Gastrointestinal: Positive for nausea and vomiting. Negative for abdominal distention, abdominal pain, constipation and diarrhea.  Genitourinary: Negative.  Negative for dysuria and hematuria.  Musculoskeletal: Negative.   Negative for arthralgias, back pain and neck pain.  Skin: Positive for pallor. Negative for color change and rash.  Neurological: Negative.  Negative for dizziness, seizures, syncope, facial asymmetry, weakness, light-headedness, numbness and headaches.  Hematological: Negative.  Does not bruise/bleed easily.  Psychiatric/Behavioral: Negative.  Negative for confusion and decreased concentration.  All other systems reviewed and are negative.   Physical Exam Updated Vital Signs BP (!) 117/59 (BP Location: Right Arm)   Pulse (!) 53   Temp 97.9 F (36.6 C) (Oral)   Resp 17   Ht 5\' 11"  (1.803  m)   Wt 102 kg   SpO2 93%   BMI 31.36 kg/m   Physical Exam Vitals and nursing note reviewed.  Constitutional:      General: He is not in acute distress.    Appearance: He is well-developed. He is not ill-appearing, toxic-appearing or diaphoretic.  HENT:     Head: Normocephalic and atraumatic.  Eyes:     Conjunctiva/sclera: Conjunctivae normal.     Pupils: Pupils are equal, round, and reactive to light.  Cardiovascular:     Rate and Rhythm: Regular rhythm. Bradycardia present.     Pulses:          Radial pulses are 3+ on the right side and 3+ on the left side.  Pulmonary:     Effort: Pulmonary effort is normal. No tachypnea or respiratory distress.     Breath sounds: Normal breath sounds. No decreased breath sounds, wheezing, rhonchi or rales.  Chest:     Chest wall: No mass or tenderness.  Abdominal:     Palpations: Abdomen is soft.     Tenderness: There is no abdominal tenderness. There is no guarding.  Musculoskeletal:     Cervical back: Normal range of motion and neck supple.  Skin:    General: Skin is warm and dry.     Coloration: Skin is not cyanotic or pale.  Neurological:     General: No focal deficit present.     Mental Status: He is alert and oriented to person, place, and time.     Cranial Nerves: Cranial nerves are intact. No cranial nerve deficit, dysarthria or facial  asymmetry.     Sensory: Sensation is intact. No sensory deficit.     Motor: Motor function is intact. No weakness or abnormal muscle tone.  Psychiatric:        Mood and Affect: Mood normal.        Behavior: Behavior normal.     ED Results / Procedures / Treatments   Labs (all labs ordered are listed, but only abnormal results are displayed) Labs Reviewed  COMPREHENSIVE METABOLIC PANEL - Abnormal; Notable for the following components:      Result Value   Glucose, Bld 138 (*)    All other components within normal limits  URINALYSIS, ROUTINE W REFLEX MICROSCOPIC - Abnormal; Notable for the following components:   Color, Urine STRAW (*)    All other components within normal limits  LIPID PANEL - Abnormal; Notable for the following components:   HDL 37 (*)    All other components within normal limits  HEMOGLOBIN A1C - Abnormal; Notable for the following components:   Hgb A1c MFr Bld 5.8 (*)    All other components within normal limits  I-STAT CHEM 8, ED - Abnormal; Notable for the following components:   Glucose, Bld 128 (*)    Hemoglobin 12.9 (*)    HCT 38.0 (*)    All other components within normal limits  CBG MONITORING, ED - Abnormal; Notable for the following components:   Glucose-Capillary 125 (*)    All other components within normal limits  SARS CORONAVIRUS 2 BY RT PCR (HOSPITAL ORDER, [facility])  CBC  LIPASE, BLOOD  MAGNESIUM  PROTIME-INR  BASIC METABOLIC PANEL  MAGNESIUM  TROPONIN I (HIGH SENSITIVITY)  TROPONIN I (HIGH SENSITIVITY)  TROPONIN I (HIGH SENSITIVITY)  TROPONIN I (HIGH SENSITIVITY)    EKG EKG Interpretation  Date/Time:  Saturday Jun 22 2019 22:45:18 EDT Ventricular Rate:  53 PR Interval:  QRS Duration: 183 QT Interval:  511 QTC Calculation: 480 R Axis:   -59 Text Interpretation: Sinus rhythm Prolonged PR interval Left bundle branch block since last tracing no significant change Confirmed by Malvin Johns [id number])  on 06/22/2019 11:39:20 PM   Radiology DG Chest Portable 1 View  Result Date: 06/22/2019 CLINICAL DATA:  Chest pain EXAM: PORTABLE CHEST 1 VIEW COMPARISON:  Radiograph 12/02/2014 FINDINGS: Streaky basilar opacities without focal consolidation, pneumothorax or effusion. Pulmonary vascularity remains fairly well-defined. Stable borderline cardiomegaly. The cardiomediastinal contours are otherwise unremarkable. No acute osseous or soft tissue abnormality. Stable contiguous or right chest wall shunt catheter tubing without discontinuity. Telemetry leads overlie the chest. IMPRESSION: Streaky basilar opacities favored to represent atelectasis. Intact right chest wall shunt catheter tubing. Electronically Signed   By: Lovena Le M.D.   On: 06/22/2019 21:41    Procedures Procedures (including critical care time)  Medications Ordered in ED Medications  nitroGLYCERIN (NITROSTAT) SL tablet 0.4 mg (0.4 mg Sublingual Given 06/22/19 2304)  ondansetron (ZOFRAN) injection 4 mg (has no administration in time range)  aspirin EC tablet 81 mg (has no administration in time range)  amLODipine (NORVASC) tablet 10 mg (has no administration in time range)  atorvastatin (LIPITOR) tablet 40 mg (has no administration in time range)  carvedilol (COREG) tablet 6.25 mg (6.25 mg Oral Given 06/22/19 2341)  sacubitril-valsartan (ENTRESTO) 97-103 mg per tablet (has no administration in time range)  spironolactone (ALDACTONE) tablet 12.5 mg (has no administration in time range)  citalopram (CELEXA) tablet 40 mg (has no administration in time range)  zolpidem (AMBIEN) tablet 5 mg (has no administration in time range)  magnesium oxide (MAG-OX) tablet 400 mg (has no administration in time range)  pantoprazole (PROTONIX) EC tablet 40 mg (has no administration in time range)  finasteride (PROSCAR) tablet 5 mg (has no administration in time range)  acetaminophen (TYLENOL) tablet 650 mg (has no administration in time range)    enoxaparin (LOVENOX) injection 40 mg (has no administration in time range)  ondansetron (ZOFRAN) injection 4 mg (4 mg Intravenous Given 06/22/19 2211)  morphine 2 MG/ML injection 2 mg (2 mg Intravenous Given 06/22/19 2342)  nitroGLYCERIN (NITROGLYN) 2 % ointment 0.5 inch (0.5 inches Topical Given 06/23/19 0000)    ED Course  I have reviewed the triage vital signs and the nursing notes.  Pertinent labs & imaging results that were available during my care of the patient were reviewed by me and considered in my medical decision making (see chart for details).    MDM Rules/Calculators/A&P                      Patient is a 79 year old male who presents with acute onset of chest pain earlier this evening.  Chest pain is described as bilateral and extending across the lower portion of his chest.  Onset was nonexertional.  He did have associated nausea and vomiting.  Patient's history is notable for CHF with last known LVEF of 25 to 30%.  This is based on echo from over a year ago.  Last heart cath was approximately 4 years ago.  EMS provided 324 ASA and 2 SL NTG's.  After second dose of NTG, patient reported improvement in chest pain.  Upon arrival in the ED, patient is normotensive.  Heart rate ranges from 50s to 60s.  He endorses 4/10 severity chest pain.  He denies any current nausea.  Additional doses of SL NTG were ordered.  EKG showed LBBB without  evidence of infarction.  Lab studies were unremarkable.  Chest x-ray showed streaky bibasilar opacities.  Initial troponin was normal.  Upon reassessment, patient endorsed recurrence of severe chest pain, now 7/10 in severity.  Repeat EKG showed no dynamic changes from prior.  3x SL NTG's were given with improvement in chest pain down to 2/10 in severity.  NTG paste was ordered for continued treatment of chest pain.  Given patient's high risk for ACS and persistent chest pain, patient was admitted to medicine service for further management and diagnostic  studies.  Final Clinical Impression(s) / ED Diagnoses Final diagnoses:  Chest pain, unspecified type  Non-intractable vomiting with nausea, unspecified vomiting type    Rx / DC Orders ED Discharge Orders    None       Godfrey Pick, MD 06/23/19 0340    Malvin Johns, MD 06/26/19 1242

## 2019-06-22 NOTE — ED Triage Notes (Signed)
Patient arrived with EMS from home reports pain across his chest after eating supper this evening with mild SOB and multiple emesis , his cardiologist is Dr. Claiborne Billings , received 2 NTG sl with relief and 325 mg ASA prior to arrival .

## 2019-06-23 ENCOUNTER — Observation Stay (HOSPITAL_BASED_OUTPATIENT_CLINIC_OR_DEPARTMENT_OTHER): Payer: PPO

## 2019-06-23 DIAGNOSIS — R079 Chest pain, unspecified: Secondary | ICD-10-CM

## 2019-06-23 DIAGNOSIS — I340 Nonrheumatic mitral (valve) insufficiency: Secondary | ICD-10-CM

## 2019-06-23 DIAGNOSIS — I351 Nonrheumatic aortic (valve) insufficiency: Secondary | ICD-10-CM

## 2019-06-23 LAB — LIPID PANEL
Cholesterol: 96 mg/dL (ref 0–200)
HDL: 37 mg/dL — ABNORMAL LOW (ref >40)
LDL Cholesterol: 47 mg/dL (ref 0–99)
Total CHOL/HDL Ratio: 2.6 RATIO
Triglycerides: 60 mg/dL (ref <150)
VLDL: 12 mg/dL (ref 0–40)

## 2019-06-23 LAB — BASIC METABOLIC PANEL
Anion gap: 6 (ref 5–15)
BUN: 22 mg/dL (ref 8–23)
CO2: 27 mmol/L (ref 22–32)
Calcium: 8.6 mg/dL — ABNORMAL LOW (ref 8.9–10.3)
Chloride: 107 mmol/L (ref 98–111)
Creatinine, Ser: 1.1 mg/dL (ref 0.61–1.24)
GFR calc Af Amer: >60 mL/min (ref >60)
GFR calc non Af Amer: >60 mL/min (ref >60)
Glucose, Bld: 120 mg/dL — ABNORMAL HIGH (ref 70–99)
Potassium: 4.4 mmol/L (ref 3.5–5.1)
Sodium: 140 mmol/L (ref 135–145)

## 2019-06-23 LAB — ECHOCARDIOGRAM COMPLETE
Height: 71 in
Weight: 3597.91 oz

## 2019-06-23 LAB — TROPONIN I (HIGH SENSITIVITY)
Troponin I (High Sensitivity): 12 ng/L (ref <18)
Troponin I (High Sensitivity): 13 ng/L (ref <18)
Troponin I (High Sensitivity): 14 ng/L (ref <18)

## 2019-06-23 LAB — MAGNESIUM: Magnesium: 2.2 mg/dL (ref 1.7–2.4)

## 2019-06-23 NOTE — Progress Notes (Signed)
  Echocardiogram 2D Echocardiogram has been performed.  Trevor Schmidt M 06/23/2019, 8:15 AM

## 2019-06-23 NOTE — Discharge Summary (Signed)
Physician Discharge Summary  Trevor Schmidt [id number] DOB: 07/28/1940 DOA: 06/22/2019  PCP: Deland Pretty, MD  Admit date: 06/22/2019 Discharge date: 06/23/2019  Admitted From: Home Discharge disposition: Home*   Recommendations for Outpatient Follow-Up:   Cardiology to arrange outpatient follow-up with cardiology   Discharge Diagnosis:   Principal Problem:   Chest pain Active Problems:   Mitral valve prolapse   Essential hypertension   BPH (benign prostatic hyperplasia)   Hyperlipidemia   LBBB (left bundle branch block)   Hyperlipidemia LDL goal <70   OSA on CPAP   CAD in native artery   Cardiomyopathy, ischemic   Hypertensive cardiovascular disease    Discharge Condition: Improved.  Diet recommendation: Low sodium, heart healthy.   Wound care: None.  Code status: Full.   History of Present Illness:   Trevor Schmidt is a 79 y.o. male with PMH of HTN, HLD, CHF and CAD who presented with chest pain and admitted for ACS rule-out.  Patient reports feeling normal today until this evening after eating dinner when he noted pain across lower chest when he took the dog out to use the restroom. He initially thought it was indigestion and took some reflux meds without relief. He then began to have nausea and vomiting. Took Aspirin but vomited this. Rated pain at 7/10. Called EMS and was given Nitro which helped with pain. Denies pain like this previously. Wife reports that he looks pale. Denies diaphoresis. Pain did not radiate. Pain is constant and still present but rating at 3/10 currently. Non-radiating, non-pleuritic. Denies headache, dizziness, fever, chills, cough, SOB, abdominal pain, diarrhea, constipation, dysuria, hematuria, hematochezia, melena, difficulty moving arms/legs, speech difficulty, trouble eating, confusion or any other complaints.  In the ED: Vitals stable on room air. Labs remarkable for CBC, Lipase, Mag, CMP WNL. Initial Trop 8. UA  without evidence of infection.    Hospital Course by Problem:   Chest pain-described as a band around his epigastrium-started after he ate a hamburger -Cardiac enzymes negative Telemetry unremarkable Echo done with improvement in ejection fracture and similar hypokinesis -Discussed briefly with cardiology and plan is for outpatient follow-up -Patient quit taking his PPI about 1 year ago -Liver enzymes negative X-ray shows atelectasis: Added incentive spirometry Patient without any further episodes even ambulating in the hallway without any chest pressure/tightness    Medical Consultants:      Discharge Exam:   Vitals:   06/23/19 0505 06/23/19 1209  BP: 111/60 116/60  Pulse: (!) 51 (!) 47  Resp: 17 17  Temp: 97.6 F (36.4 C) 98 F (36.7 C)  SpO2: 90% 91%   Vitals:   06/23/19 0030 06/23/19 0106 06/23/19 0505 06/23/19 1209  BP: (!) 102/51 (!) 117/59 111/60 116/60  Pulse: (!) 53 (!) 53 (!) 51 (!) 47  Resp: 19 17 17 17   Temp:  97.9 F (36.6 C) 97.6 F (36.4 C) 98 F (36.7 C)  TempSrc:  Oral Oral   SpO2: 94% 93% 90% 91%  Weight:      Height:        General exam: Appears calm and comfortable.  The results of significant diagnostics from this hospitalization (including imaging, microbiology, ancillary and laboratory) are listed below for reference.     Procedures and Diagnostic Studies:   DG Chest Portable 1 View  Result Date: 06/22/2019 CLINICAL DATA:  Chest pain EXAM: PORTABLE CHEST 1 VIEW COMPARISON:  Radiograph 12/02/2014 FINDINGS: Streaky basilar opacities without focal consolidation, pneumothorax or effusion. Pulmonary  vascularity remains fairly well-defined. Stable borderline cardiomegaly. The cardiomediastinal contours are otherwise unremarkable. No acute osseous or soft tissue abnormality. Stable contiguous or right chest wall shunt catheter tubing without discontinuity. Telemetry leads overlie the chest. IMPRESSION: Streaky basilar opacities favored to  represent atelectasis. Intact right chest wall shunt catheter tubing. Electronically Signed   By: Lovena Le M.D.   On: 06/22/2019 21:41   ECHOCARDIOGRAM COMPLETE  Result Date: 06/23/2019    ECHOCARDIOGRAM REPORT   Patient Name:   Trevor Schmidt Date of Exam: 06/23/2019 Medical Rec #:  [id number]       Height:       71.0 in Accession #:    [id number]      Weight:       224.9 lb Date of Birth:  February 27, 1940       BSA:          2.216 m Patient Age:    79 years        BP:           111/60 mmHg Patient Gender: M               HR:           51 bpm. Exam Location:  Inpatient Procedure: 2D Echo Indications:    Chest Pain 786.50 / R07.9  History:        Patient has prior history of Echocardiogram examinations, most                 recent 04/11/2018. CHF, CAD; Risk Factors:Hypertension and                 Dyslipidemia. GERD.  Sonographer:    Darlina Sicilian RDCS Referring Phys: [id number] [place]  1. Left ventricular ejection fraction, by estimation, is 30 to 35%. The left ventricle has moderately decreased function. The left ventricle demonstrates global hypokinesis. There is mild concentric left ventricular hypertrophy. Left ventricular diastolic parameters are consistent with Grade I diastolic dysfunction (impaired relaxation). Elevated left ventricular end-diastolic pressure.  2. The mitral valve is degenerative. Mild mitral valve regurgitation.  3. The aortic valve is tricuspid. Aortic valve regurgitation is mild. No aortic stenosis is present.  4. Aortic dilatation noted. 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 There is mild dilatation of the aortic root measuring 41 mm.  5. Right ventricular systolic function is mildly reduced. The right ventricular size is normal.  6. Left atrial size was mild to moderately dilated.  7. The inferior vena cava is normal in size with greater than 50% respiratory variability, suggesting right atrial pressure of 3 mmHg. FINDINGS  Left Ventricle: Left  ventricular ejection fraction, by estimation, is 30 to 35%. The left ventricle has moderately decreased function. The left ventricle demonstrates global hypokinesis. The left ventricular internal cavity size was normal in size. There is mild concentric left ventricular hypertrophy. Left ventricular diastolic parameters are consistent with Grade I diastolic dysfunction (impaired relaxation). Elevated left ventricular end-diastolic pressure. Right Ventricle: The right ventricular size is normal. No increase in right ventricular wall thickness. Right ventricular systolic function is mildly reduced. Left Atrium: Left atrial size was mild to moderately dilated. Right Atrium: Right atrial size was normal in size. Pericardium: There is no evidence of pericardial effusion. Mitral Valve: The mitral valve is degenerative in appearance. There is mild thickening of the mitral valve leaflet(s). Mild mitral annular calcification. Mild mitral valve regurgitation. Tricuspid Valve: The tricuspid valve is grossly normal. Tricuspid valve  regurgitation is trivial. Aortic Valve: The aortic valve is tricuspid. Aortic valve regurgitation is mild. Aortic regurgitation PHT measures 683 msec. No aortic stenosis is present. Mild aortic valve annular calcification. Pulmonic Valve: The pulmonic valve was grossly normal. Pulmonic valve regurgitation is mild. Aorta: Aortic dilatation noted. There is mild dilatation of the aortic root measuring 41 mm. There is an aneurysm involving the ascending aorta. The aneurysm measures 45 mm. Venous: The inferior vena cava is normal in size with greater than 50% respiratory variability, suggesting right atrial pressure of 3 mmHg. IAS/Shunts: The interatrial septum was not well visualized.  LEFT VENTRICLE PLAX 2D LVIDd:         5.40 cm  Diastology LVIDs:         3.70 cm  LV e' lateral:   5.11 cm/s LV PW:         1.20 cm  LV E/e' lateral: 13.3 LV IVS:        1.10 cm  LV e' medial:    3.81 cm/s LVOT diam:      1.90 cm  LV E/e' medial:  17.9 LV SV:         82 LV SV Index:   37 LVOT Area:     2.84 cm  RIGHT VENTRICLE RV S prime:     13.80 cm/s TAPSE (M-mode): 2.5 cm LEFT ATRIUM             Index       RIGHT ATRIUM           Index LA diam:        3.50 cm 1.58 cm/m  RA Area:     17.10 cm LA Vol (A2C):   83.4 ml 37.63 ml/m RA Volume:   36.10 ml  16.29 ml/m LA Vol (A4C):   88.4 ml 39.89 ml/m LA Biplane Vol: 87.0 ml 39.26 ml/m  AORTIC VALVE LVOT Vmax:   127.00 cm/s LVOT Vmean:  86.800 cm/s LVOT VTI:    0.289 m AI PHT:      683 msec  AORTA Ao Root diam: 4.10 cm MITRAL VALVE MV Area (PHT): 2.01 cm     SHUNTS MV Decel Time: 377 msec     Systemic VTI:  0.29 m MV E velocity: 68.10 cm/s   Systemic Diam: 1.90 cm MV A velocity: 101.00 cm/s MV E/A ratio:  0.67 Kate Sable MD Electronically signed by Kate Sable MD Signature Date/Time: 06/23/2019/1:37:47 PM    Final      Labs:   Basic Metabolic Panel: Recent Labs  Lab 06/22/19 2125 06/22/19 2125 06/22/19 2141 06/23/19 0441  NA 140  --  138 140  K 3.8   < > 3.9 4.4  CL 107  --  102 107  CO2 23  --   --  27  GLUCOSE 138*  --  128* 120*  BUN 20  --  20 22  CREATININE 1.06  --  0.90 1.10  CALCIUM 9.1  --   --  8.6*  MG 2.1  --   --  2.2   < > = values in this interval not displayed.   GFR Estimated Creatinine Clearance: 66.2 mL/min (by C-G formula based on SCr of 1.1 mg/dL). Liver Function Tests: Recent Labs  Lab 06/22/19 2125  AST 20  ALT 21  ALKPHOS 90  BILITOT 0.8  PROT 7.1  ALBUMIN 4.0   Recent Labs  Lab 06/22/19 2125  LIPASE 28   No results for input(s): AMMONIA in the  last 168 hours. Coagulation profile Recent Labs  Lab 06/22/19 2125  INR 1.0    CBC: Recent Labs  Lab 06/22/19 2125 06/22/19 2141  WBC 10.0  --   HGB 13.6 12.9*  HCT 41.2 38.0*  MCV 91.8  --   PLT 180  --    Cardiac Enzymes: No results for input(s): CKTOTAL, CKMB, CKMBINDEX, TROPONINI in the last 168 hours. BNP: Invalid input(s):  POCBNP CBG: Recent Labs  Lab 06/22/19 2139  GLUCAP 125*   D-Dimer No results for input(s): DDIMER in the last 72 hours. Hgb A1c Recent Labs    06/22/19 2334  HGBA1C 5.8*   Lipid Profile Recent Labs    06/22/19 2334  CHOL 96  HDL 37*  LDLCALC 47  TRIG 60  CHOLHDL 2.6   Thyroid function studies No results for input(s): TSH, T4TOTAL, T3FREE, THYROIDAB in the last 72 hours.  Invalid input(s): FREET3 Anemia work up No results for input(s): VITAMINB12, FOLATE, FERRITIN, TIBC, IRON, RETICCTPCT in the last 72 hours. Microbiology Recent Results (from the past 240 hour(s))  SARS Coronavirus 2 by RT PCR (hospital order, performed in [facility] hospital lab) Nasopharyngeal Nasopharyngeal Swab     Status: None   Collection Time: 06/22/19  9:35 PM   Specimen: Nasopharyngeal Swab  Result Value Ref Range Status   SARS Coronavirus 2 NEGATIVE NEGATIVE Final    Comment: (NOTE) SARS-CoV-2 target nucleic acids are NOT DETECTED. The SARS-CoV-2 RNA is generally detectable in upper and lower respiratory specimens during the acute phase of infection. The lowest concentration of SARS-CoV-2 viral copies this assay can detect is 250 copies / mL. A negative result does not preclude SARS-CoV-2 infection and should not be used as the sole basis for treatment or other patient management decisions.  A negative result may occur with improper specimen collection / handling, submission of specimen other than nasopharyngeal swab, presence of viral mutation(s) within the areas targeted by this assay, and inadequate number of viral copies (<250 copies / mL). A negative result must be combined with clinical observations, patient history, and epidemiological information. Fact Sheet for Patients:   StrictlyIdeas.no Fact Sheet for Healthcare Providers: BankingDealers.co.za This test is not yet approved or cleared  by the [place] FDA and has been  authorized for detection and/or diagnosis of SARS-CoV-2 by FDA under an Emergency Use Authorization (EUA).  This EUA will remain in effect (meaning this test can be used) for the duration of the COVID-19 declaration under Section 564(b)(1) of the Act, 21 U.S.C. section 360bbb-3(b)(1), unless the authorization is terminated or revoked sooner. Performed at [facility], [place] [address], [place], [place] [address]      Discharge Instructions:   Discharge Instructions    Diet - low sodium heart healthy   Complete by: As directed    Increase activity slowly   Complete by: As directed      Allergies as of 06/23/2019      Reactions   Buspirone Diarrhea   Ceftriaxone Rash      Medication List    STOP taking these medications   omeprazole 20 MG capsule Commonly known as: PRILOSEC     TAKE these medications   amLODipine 10 MG tablet Commonly known as: NORVASC TAKE ONE TABLET BY MOUTH DAILY   aspirin 81 MG tablet Take 81 mg by mouth daily.   atorvastatin 40 MG tablet Commonly known as: LIPITOR Take 40 mg by mouth daily.   calcium citrate 950 (200 Ca) MG tablet  Commonly known as: CALCITRATE - dosed in mg elemental calcium Take 200 mg of elemental calcium by mouth 2 (two) times daily.   carvedilol 6.25 MG tablet Commonly known as: COREG TAKE ONE TABLET BY MOUTH TWICE A DAY   citalopram 40 MG tablet Commonly known as: CELEXA TAKE 1 TABLET (40 MG TOTAL) BY MOUTH DAILY.   Entresto 97-103 MG Generic drug: sacubitril-valsartan Take 1 tablet by mouth 2 (two) times daily.   finasteride 5 MG tablet Commonly known as: PROSCAR Take 5 mg by mouth daily.   magnesium oxide 400 MG tablet Commonly known as: MAG-OX Take 400 mg by mouth daily.   OCUVITE-LUTEIN PO Take 2 tablets by mouth daily.   Potassium Chloride ER 20 MEQ Tbcr TAKE ONE TABLET BY MOUTH TWICE A DAY   spironolactone 25 MG tablet Commonly known as: ALDACTONE TAKE 1/2 TABLET BY MOUTH DAILY   VITAMIN  B-12 PO Take 100 mcg by mouth daily.   zolpidem 5 MG tablet Commonly known as: AMBIEN Take 1 tablet (5 mg total) by mouth at bedtime as needed for sleep.      Follow-up Information    Deland Pretty, MD Follow up in 1 week(s).   Specialty: Internal Medicine Contact information: [address] [place] [address] [phone]        Troy Sine, MD. Schedule an appointment as soon as possible for a visit.   Specialty: Cardiology Contact information: [address] [place] [address] [phone]            Time coordinating discharge: 25 minutes  Signed:  Geradine Girt DO  [facility] 06/23/2019, 2:29 PM

## 2019-06-23 NOTE — Progress Notes (Signed)
Pt given discharge summary and discharged via wife as transportation. Gave incentive spirometer to pt and provided teaching.

## 2019-07-02 ENCOUNTER — Telehealth: Payer: Self-pay | Admitting: Medical

## 2019-07-02 NOTE — Telephone Encounter (Signed)
Noted  Will make provider aware  

## 2019-07-02 NOTE — Telephone Encounter (Signed)
New message   Patient's wife states that she will be coming with patient to office visit with Daleen Snook K. Due to patient having short term memory.

## 2019-07-03 NOTE — Progress Notes (Signed)
Cardiology Office Note   Date:  07/04/2019   ID:  Trevor Schmidt, DOB 05/13/1940, MRN [id number]  PCP:  Deland Pretty, MD  Cardiologist:  Shelva Majestic, MD EP: None  Chief Complaint  Patient presents with  . Hospitalization Follow-up      History of Present Illness: Trevor Schmidt is a 79 y.o. male with PMH of CAD (medically managed), chronic LBBB, HTN, HLD, OSA on CPAP, who presents for hospital follow-up.  He was last evaluated by cardiology at an outpatient visit with Dr. Claiborne Billings 10/2018, at which time he was was doing well from a cardiac standpoint and was without anginal complaints. No medication changes occurred and he was recommended to f/u in 1 year or sooner if issues.  He had a heart catheterization in 2016 to evaluate an abnormal stress test which revealed 30% mLAD stenosis, 50-60% dRCA stenosis, and 100% post atrio lesion with left to right collaterals, recommended for medical management.   He was admitted to the hospital 06/22/19-06/23/19 after presenting with chest pain shortly after eating dinner which was associated with nausea and vomiting. EKG revealed chronic LBBB. HsTroponins were negative.  Echo that admission showed EF 30-35%, global hypokinesis, mild concentric LVH, G1DD, elevated LVEDP, mild MR, mild AI, and moderate ascending aortic aneurysm (53mm). He was recommended for close outpatient follow-up with cardiology.   He presents today for post-hospital follow-up. He has been doing well since discharge from the hospital. No recurrent chest pain, abdominal pain, nausea, or vomiting. Suspect his episode was GI related. Otherwise he has been doing quite well from a cardiac standpoint. He has chronic DOE which is unchanged in recent months. No complaints of exertional chest pain, SOB at rest, orthopnea, PND, LE edema, dizziness, lightheadedness, or syncope. We discussed that his EF has improved slightly, though still remains under 35%. We discussed placement of an ICD for  primary prevention. They report their son has had quite a few issues with his ICD and are not interested in pursing placement at this time. They will continue to think about this and plan to discuss with Dr. Claiborne Billings at future visits.    Past Medical History:  Diagnosis Date  . Acute bronchitis 02/04/2015  . Arthritis   . Cancer ([place])    bladder  . Confusion   . Depression   . Dysrhythmia   . Edema    feet/ankles  . GERD (gastroesophageal reflux disease)   . HOH (hard of hearing)    aids  . Hypertension   . Kidney stones   . MVP (mitral valve prolapse)   . Prostate disease   . Sleep apnea     Past Surgical History:  Procedure Laterality Date  . BACK SURGERY    . BLADDER SURGERY     cancer  . CARDIAC CATHETERIZATION N/A 12/09/2014   Procedure: Left Heart Cath and Coronary Angiography;  Surgeon: Troy Sine, MD;  Location: [facility];  Service: Cardiovascular;  Laterality: N/A;  . CATARACT EXTRACTION W/PHACO Right 09/08/2014   Procedure: CATARACT EXTRACTION PHACO AND INTRAOCULAR LENS PLACEMENT (IOC);  Surgeon: Estill Cotta, MD;  Location: [facility];  Service: Ophthalmology;  Laterality: Right;  US:1:06.3 AP%:23.3 CDE:24.97 Fluid lot# [id number]  . CEREBRAL ANEURYSM REPAIR     coil and shunt placement  . CYSTOSCOPY     litho x 2  . EYE SURGERY    . KNEE ARTHROSCOPY    . WRIST SURGERY     ctr  Current Outpatient Medications  Medication Sig Dispense Refill  . amLODipine (NORVASC) 10 MG tablet TAKE ONE TABLET BY MOUTH DAILY 90 tablet 1  . aspirin 81 MG tablet Take 81 mg by mouth daily.    Marland Kitchen atorvastatin (LIPITOR) 40 MG tablet Take 40 mg by mouth daily.    . calcium citrate (CALCITRATE - DOSED IN MG ELEMENTAL CALCIUM) 950 MG tablet Take 200 mg of elemental calcium by mouth 2 (two) times daily.     . carvedilol (COREG) 6.25 MG tablet TAKE ONE TABLET BY MOUTH TWICE A DAY 180 tablet 2  . citalopram (CELEXA) 40 MG tablet TAKE 1 TABLET (40 MG TOTAL) BY MOUTH DAILY.  30 tablet 0  . finasteride (PROSCAR) 5 MG tablet Take 5 mg by mouth daily.    . magnesium oxide (MAG-OX) 400 MG tablet Take 400 mg by mouth daily.    . Potassium Chloride ER 20 MEQ TBCR TAKE ONE TABLET BY MOUTH TWICE A DAY (Patient taking differently: Taking 1 tablet daiy) 180 tablet 3  . sacubitril-valsartan (ENTRESTO) 97-103 MG Take 1 tablet by mouth 2 (two) times daily. 180 tablet 2  . spironolactone (ALDACTONE) 25 MG tablet TAKE 1/2 TABLET BY MOUTH DAILY 45 tablet 2  . vitamin B-12 (CYANOCOBALAMIN) 500 MCG tablet Take by mouth.    . zolpidem (AMBIEN) 5 MG tablet Take 1 tablet (5 mg total) by mouth at bedtime as needed for sleep. 30 tablet 0   No current facility-administered medications for this visit.    Allergies:   Buspirone and Ceftriaxone    Social History:  The patient  reports that he has quit smoking. His smokeless tobacco use includes snuff. He reports that he does not drink alcohol or use drugs.   Family History:  The patient's family history includes Healthy in his maternal grandfather, maternal grandmother, paternal grandfather, and paternal grandmother; Heart disease in his mother; Prostate cancer in his father.    ROS:  Please see the history of present illness.   Otherwise, review of systems are positive for none.   All other systems are reviewed and negative.    PHYSICAL EXAM: VS:  BP 120/62   Pulse (!) 52   Temp (!) 97.3 F (36.3 C)   Ht 5' 10.5" (1.791 m)   Wt 202 lb (91.6 kg)   BMI 28.57 kg/m  , BMI Body mass index is 28.57 kg/m. GEN: Well nourished, well developed, in no acute distress HEENT: sclera anicteric Neck: no JVD, carotid bruits, or masses Cardiac: RRR; no murmurs, rubs, or gallops, no edema  Respiratory:  clear to auscultation bilaterally, normal work of breathing GI: soft, nontender, nondistended, + BS MS: no deformity or atrophy Skin: warm and dry, no rash Neuro:  Strength and sensation are intact Psych: euthymic mood, full  affect   EKG:  EKG is ordered today. The ekg ordered today demonstrates sinus rhythm with rate 52 bpm, chronic LBBB; no change from previous.    Recent Labs: 06/22/2019: ALT 21; Hemoglobin 12.9; Platelets 180 06/23/2019: BUN 22; Creatinine, Ser 1.10; Magnesium 2.2; Potassium 4.4; Sodium 140    Lipid Panel    Component Value Date/Time   CHOL 96 06/22/2019 2334   TRIG 60 06/22/2019 2334   HDL 37 (L) 06/22/2019 2334   CHOLHDL 2.6 06/22/2019 2334   VLDL 12 06/22/2019 2334   LDLCALC 47 06/22/2019 2334      Wt Readings from Last 3 Encounters:  07/04/19 202 lb (91.6 kg)  06/22/19 224 lb 13.9 oz (  102 kg)  10/22/18 205 lb (93 kg)      Other studies Reviewed: Additional studies/ records that were reviewed today include:   Echocardiogram 06/23/2019: 1. Left ventricular ejection fraction, by estimation, is 30 to 35%. The  left ventricle has moderately decreased function. The left ventricle  demonstrates global hypokinesis. There is mild concentric left ventricular  hypertrophy. Left ventricular  diastolic parameters are consistent with Grade I diastolic dysfunction  (impaired relaxation). Elevated left ventricular end-diastolic pressure.  2. The mitral valve is degenerative. Mild mitral valve regurgitation.  3. The aortic valve is tricuspid. Aortic valve regurgitation is mild. No  aortic stenosis is present.  4. Aortic dilatation noted. 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 There is mild dilatation of the aortic root measuring 41  mm.  5. Right ventricular systolic function is mildly reduced. The right  ventricular size is normal.  6. Left atrial size was mild to moderately dilated.  7. The inferior vena cava is normal in size with greater than 50%  respiratory variability, suggesting right atrial pressure of 3 mmHg.  Marland Kitchen Left heart catheterization 12/2014:  Mid LAD lesion, 30% stenosed.  Dist RCA-1 lesion, 50% stenosed.  Dist RCA-2 lesion, 65%  stenosed.  Post Atrio lesion, 100% stenosed.   Coronary obstructive disease with very short short left main, which immediately bifurcated into the LAD and left circumflex vessel.  The LAD had 30% proximal stenosis immediately after the first diagonal branch.  The circumflex vessel was a large caliber vessel, and there was extensive collateralization to the distal RCA branches.  The RCA immediately gave rise to a conus branch and was tortuous.  There was diffuse 50-60% stenoses proximal and medially beyond the acute margin prior to a large PDA vessel.  The distal RCA after second small inferior LV branch was totally occluded.  There are extensive collateralization to the distal RCA via the left injection.  Previous documentation of reduced LV function with an ejection fraction of 30% by echocardiography.  RECOMMENDATION: The patient has not experienced any significant anginal type symptoms.  His nuclear study did show predominant inferior ischemia which is due to his distal RCA occlusion.  Initial recommendation is medical therapy , both for his ischemia as well as reduced LV function.  ASSESSMENT AND PLAN:  1. Chest pain in patient with medically managed CAD: hospital work-up reassuring. No reoccurrence of chest pain. No anginal complaints - Continue aspirin and statin - Continue amlodipine and carvedilol  2. Ischemic cardiomyopathy/chronic combined CHF: no volume overload complaints and he appears euvolemic on exam. EF up to 30-35% on echo 06/2019 from 25-30% 04/2018. Not interested in ICD placement at this time.  - Continue carvedilol, entresto, and spironolactone   3. HTN: BP 120/62 today - Continue amlodipine, carvedilol, entresto, and spironolactone  4. HLD: LDL 47 06/22/19 - Continue atorvastatin  5. Ascending aortic aneurysm: 45 mm on echo 06/2019 - Continue BP management as above and routine monitoring   Current medicines are reviewed at length with the patient today.  The patient  does not have concerns regarding medicines.  The following changes have been made:  As above  Labs/ tests ordered today include: None  Orders Placed This Encounter  Procedures  . EKG 12-Lead     Disposition:   FU with Dr. Claiborne Billings as scheduled 10/2019   Signed, Abigail Butts, PA-C  07/04/2019 10:54 AM

## 2019-07-04 ENCOUNTER — Encounter: Payer: Self-pay | Admitting: Medical

## 2019-07-04 ENCOUNTER — Ambulatory Visit: Payer: PPO | Admitting: Medical

## 2019-07-04 ENCOUNTER — Other Ambulatory Visit: Payer: Self-pay

## 2019-07-04 VITALS — BP 120/62 | HR 52 | Temp 97.3°F | Ht 70.5 in | Wt 202.0 lb

## 2019-07-04 DIAGNOSIS — I712 Thoracic aortic aneurysm, without rupture: Secondary | ICD-10-CM | POA: Diagnosis not present

## 2019-07-04 DIAGNOSIS — I2510 Atherosclerotic heart disease of native coronary artery without angina pectoris: Secondary | ICD-10-CM | POA: Diagnosis not present

## 2019-07-04 DIAGNOSIS — I7121 Aneurysm of the ascending aorta, without rupture: Secondary | ICD-10-CM

## 2019-07-04 DIAGNOSIS — I10 Essential (primary) hypertension: Secondary | ICD-10-CM | POA: Diagnosis not present

## 2019-07-04 DIAGNOSIS — E785 Hyperlipidemia, unspecified: Secondary | ICD-10-CM

## 2019-07-04 DIAGNOSIS — R079 Chest pain, unspecified: Secondary | ICD-10-CM | POA: Diagnosis not present

## 2019-07-04 DIAGNOSIS — I255 Ischemic cardiomyopathy: Secondary | ICD-10-CM

## 2019-07-04 NOTE — Patient Instructions (Addendum)
Medication Instructions:  Your physician recommends that you continue on your current medications as directed. Please refer to the Current Medication list given to you today.  *If you need a refill on your cardiac medications before your next appointment, please call your pharmacy*  Lab Work: NONE ordered at this time of appointment   If you have labs (blood work) drawn today and your tests are completely normal, you will receive your results only by: Marland Kitchen MyChart Message (if you have MyChart) OR . A paper copy in the mail If you have any lab test that is abnormal or we need to change your treatment, we will call you to review the results.  Testing/Procedures: NONE ordered at this time of appointment   Follow-Up: At [facility], you and your health needs are our priority.  As part of our continuing mission to provide you with exceptional heart care, we have created designated Provider Care Teams.  These Care Teams include your primary Cardiologist (physician) and Advanced Practice Providers (APPs -  Physician Assistants and Nurse Practitioners) who all work together to provide you with the care you need, when you need it.  Your next appointment:   As Scheduled-10/23/2019 at 8AM  The format for your next appointment:   In Person  Provider:   Shelva Majestic, MD  Other Instructions

## 2019-08-15 ENCOUNTER — Other Ambulatory Visit: Payer: Self-pay | Admitting: Cardiovascular Disease

## 2019-08-19 ENCOUNTER — Other Ambulatory Visit: Payer: Self-pay | Admitting: Cardiovascular Disease

## 2019-09-19 DIAGNOSIS — E785 Hyperlipidemia, unspecified: Secondary | ICD-10-CM | POA: Diagnosis not present

## 2019-09-19 DIAGNOSIS — E7800 Pure hypercholesterolemia, unspecified: Secondary | ICD-10-CM | POA: Diagnosis not present

## 2019-09-19 DIAGNOSIS — E538 Deficiency of other specified B group vitamins: Secondary | ICD-10-CM | POA: Diagnosis not present

## 2019-09-19 DIAGNOSIS — Z125 Encounter for screening for malignant neoplasm of prostate: Secondary | ICD-10-CM | POA: Diagnosis not present

## 2019-09-19 DIAGNOSIS — Z0000 Encounter for general adult medical examination without abnormal findings: Secondary | ICD-10-CM | POA: Diagnosis not present

## 2019-09-19 DIAGNOSIS — E663 Overweight: Secondary | ICD-10-CM | POA: Diagnosis not present

## 2019-09-26 DIAGNOSIS — I2510 Atherosclerotic heart disease of native coronary artery without angina pectoris: Secondary | ICD-10-CM | POA: Diagnosis not present

## 2019-09-26 DIAGNOSIS — E785 Hyperlipidemia, unspecified: Secondary | ICD-10-CM | POA: Diagnosis not present

## 2019-09-26 DIAGNOSIS — I4891 Unspecified atrial fibrillation: Secondary | ICD-10-CM | POA: Diagnosis not present

## 2019-09-26 DIAGNOSIS — Z8551 Personal history of malignant neoplasm of bladder: Secondary | ICD-10-CM | POA: Diagnosis not present

## 2019-09-26 DIAGNOSIS — K219 Gastro-esophageal reflux disease without esophagitis: Secondary | ICD-10-CM | POA: Diagnosis not present

## 2019-09-26 DIAGNOSIS — I10 Essential (primary) hypertension: Secondary | ICD-10-CM | POA: Diagnosis not present

## 2019-09-26 DIAGNOSIS — I5022 Chronic systolic (congestive) heart failure: Secondary | ICD-10-CM | POA: Diagnosis not present

## 2019-09-26 DIAGNOSIS — Z0000 Encounter for general adult medical examination without abnormal findings: Secondary | ICD-10-CM | POA: Diagnosis not present

## 2019-10-23 ENCOUNTER — Encounter: Payer: Self-pay | Admitting: Cardiovascular Disease

## 2019-10-23 ENCOUNTER — Other Ambulatory Visit: Payer: Self-pay

## 2019-10-23 ENCOUNTER — Ambulatory Visit: Payer: PPO | Admitting: Cardiovascular Disease

## 2019-10-23 VITALS — BP 97/59 | HR 54 | Ht 70.0 in | Wt 203.6 lb

## 2019-10-23 DIAGNOSIS — I255 Ischemic cardiomyopathy: Secondary | ICD-10-CM | POA: Diagnosis not present

## 2019-10-23 DIAGNOSIS — I10 Essential (primary) hypertension: Secondary | ICD-10-CM | POA: Diagnosis not present

## 2019-10-23 DIAGNOSIS — I712 Thoracic aortic aneurysm, without rupture: Secondary | ICD-10-CM | POA: Diagnosis not present

## 2019-10-23 DIAGNOSIS — G4733 Obstructive sleep apnea (adult) (pediatric): Secondary | ICD-10-CM

## 2019-10-23 DIAGNOSIS — I447 Left bundle-branch block, unspecified: Secondary | ICD-10-CM

## 2019-10-23 DIAGNOSIS — I7121 Aneurysm of the ascending aorta, without rupture: Secondary | ICD-10-CM

## 2019-10-23 DIAGNOSIS — E785 Hyperlipidemia, unspecified: Secondary | ICD-10-CM

## 2019-10-23 DIAGNOSIS — I2510 Atherosclerotic heart disease of native coronary artery without angina pectoris: Secondary | ICD-10-CM | POA: Diagnosis not present

## 2019-10-23 MED ORDER — AMLODIPINE BESYLATE 5 MG PO TABS: 5.0000 mg | ORAL_TABLET | Freq: Every day | ORAL | 3 refills | Status: DC

## 2019-10-23 NOTE — Progress Notes (Signed)
Patient ID: Trevor Schmidt, male   DOB: 1940-02-05, 79 y.o.   MRN: [id number]     Primary: Trevor Po, FNP  HPI:  Trevor Schmidt is a 79 year old white male who established care with me in September 2016.  He presents for 12-monthfollow-up cardiology evaluation.  Mr BBonsallhas a history of hypertension, and previous diagnosis of mitral valve prolapse.  Remotely, he had seen Trevor Schmidt  In 2001 he had undergone a cardiac catheterization and was not told of having significant coronary blockage.  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  [place]  He had seen Trevor Schmidt  He has a history of obstructive sleep apnea and has been on CPAP therapy.  He received a new machine in February 2016 after his old machine malfunction.  A review of records from the [facility] reveals that there is also a history of remote PAF, hyperlipidemia, essential hypertension.  He denies any chest pain.  He is unaware of any recent cardiac arrhythmia.  He is using CPAP with 100% compliance.  He also has a history of hyperlipidemia and has been on atorvastatin.    When I initially saw him, I scheduled him for an echo Doppler study and nuclear stress test.  The echo Doppler study showed an ejection fraction of 30% with mild LVH and severe hypokinesis of the septal wall and inferior walls.  There was septal lateral dyssynergy.  There was grade 1 diastolic dysfunction.  There was no evidence for aortic valve stenosis, but there was mild aortic insufficiency.  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  He had mitral annular calcification with trivial MR, mild left atrial dilatation, and mild right atrial dilatation, he underwent a nuclear perfusion study which revealed concordant data with an ejection fraction of 34%.  He has underlying left bundle branch block.  A reversible septal and inferior perfusion defect was  demonstrated.    He underwent cardiac catheterization on 11/28/2014 which revealed CAD with a very short short left main, which immediately bifurcated into the LAD and left circumflex vessel. The LAD had 30% proximal stenosis immediately after the first diagonal branch. The circumflex vessel was a large caliber vessel, and there was extensive collateralization to the distal RCA branches. The RCA immediately gave rise to a conus branch and was tortuous. There was diffuse 50-60% stenoses proximal and medially beyond the acute margin prior to a large PDA vessel. The distal RCA after second small inferior LV branch was totally occluded. There were extensive collateralization to the distal RCA via the left injection.  He was felt that his inferior ischemia seen on the nuclear study was due to his distal RCA occlusion.  Medical therapy was recommended.  In June 2017 a f/u echo Doppler study showed an EF of 35% with Gr I DD.   His aortic root was mildly dioated at 40 mm.  Was aortic valve sclerosis without stenosis.  There was diffuse hypokinesis with septal lateral dyssynergy.  He had laboratory done by Trevor Schmidt February 2019 which showed a BUN of 11 creatinine 0.9.  LFTs were normal.  He continues to be on atorvastatin for hyperlipidemia and recent lipid studies were excellent with a total cholesterol 104, triglycerides 89, LDL 43.  He continues to use CPAP with 100% compliance and has a ResMed AirSense 10 CPAP machine. His D[employer] company is [employer]  He has  remote history of bladder cancer and is followed by Trevor Schmidt.    A follow-up echo Doppler study in September 04, 2017  demonstrated severe global LV dysfunction with an EF of 20 to 25%, moderate LVH, grade 1 diastolic dysfunction, mild aortic insufficiency, mild dilation of his a sending aorta, mitral annular calcification with mild MR, and moderate left atrial enlargement.  His EF had decreased since his last evaluation.  He noted shortness of breath with  exertion.  He can walk at a slow pace up to a mile and a half without shortness of breath. He had recent laboratory done by his primary physician and his potassium was low at 3.4.  He was on 20 mill equivalents of potassium twice a day in addition to doxazosin 2 mg, olmesartan 40 mg, amlodipine 10 mg.   When I saw him on October 05, 2017, I recommended transition to [facility] and discontinued olmesartan.  I also recommended that he reduce his KCl to 20 mEq daily.  He saw Trevor Schmidt, Pharm.D. on October 31, 2017.  He was tolerating his medication well without dizziness or chest pain.  His potassium was still 3.3 checked the day before and he was advised to resume the 20 mg once twice a day of potassium supplementation.  Since his blood pressure was 112/62 Trevor Schmidt was not further titrated.  When I saw him on November 30, 2017 he was doing well on Entresto 49/51 mg twice a day in place of olmesartan.  BP during that evaluation was stable and I added low-dose spironolactone 12.5 mg for aldosterone blockade.  Follow-up bmet on December 14, 2017 showed creatinine of 1.12 and potassium 4.2.  I  saw him on January 11, 2018.  At that time, his blood pressure was 122/78.  I recommended he discontinue doxazosin and further titrated Entresto to 97/103 mg bid.  He has felt improved on maximal therapy.  He underwent a follow-up echo Doppler study on April 11, 2018 which shows slight improvement in ejection fraction now at 25-30.  His left atrium was moderately dilated.  There was mild aortic sclerosis without stenosis.  There was mild dilation of his ascending aorta.  Repeat laboratory in Dr. Denita Schmidt office on March 12, 2018 showed stable renal function with a creatinine of 1.11.  Potassium was 4.4.   I  saw him in March 2020.  At that time he remained stable and denied any symptoms of CHF.  An echo Doppler study on April 11, 2018 continue to show an EF of 25 to 30% with mild concentric LVH and grade 1 diastolic  dysfunction.  There was mitral annular calcification and aortic sclerosis with mild AR.  At the time he denied chest pain PND orthopnea or symptoms of CHF.  He was continuing to use CPAP with 100% compliance.   I last saw him in September 2020 which continue to be stable.  He was walking at least 1-1/2 miles per day and denied any change in exercise tolerance. He underwent complete set of laboratory at Dr. Pennie Banter office on September 13, 2018 which remained stable.  Hemoglobin hematocrit were 12.7/37.8.  Leukos was minimally increased at 105.  Renal function was stable with a creatinine of 1.19 and he had normal LFTs.  Lipid studies were excellent with total cholesterol 108, HDL 37, LDL 41 and triglycerides 150.  TSH was normal at 3.17.    Over the past year, Mr. Mondry continues to do well.  He cuts his grass.  He denies  shortness of breath or any chest pain with activity.  He continues to walk at least 1/2 miles per day.  He is unaware of any presyncope or syncope or palpitations.  He apparently was evaluated by Roby Lofts in June 2021 and with his reduced EF in the 30 to 35% range which had improved with Trevor Schmidt she discussed the possibility of EP evaluation for consideration of prophylactic ICD.  He presents for follow-up evaluation.   Past Medical History:  Diagnosis Date  . Acute bronchitis 02/04/2015  . Arthritis   . Cancer ([place])    bladder  . Confusion   . Depression   . Dysrhythmia   . Edema    feet/ankles  . GERD (gastroesophageal reflux disease)   . HOH (hard of hearing)    aids  . Hypertension   . Kidney stones   . MVP (mitral valve prolapse)   . Prostate disease   . Sleep apnea     Past Surgical History:  Procedure Laterality Date  . BACK SURGERY    . BLADDER SURGERY     cancer  . CARDIAC CATHETERIZATION N/A 12/09/2014   Procedure: Left Heart Cath and Coronary Angiography;  Surgeon: Troy Sine, MD;  Location: [facility];  Service: Cardiovascular;   Laterality: N/A;  . CATARACT EXTRACTION W/PHACO Right 09/08/2014   Procedure: CATARACT EXTRACTION PHACO AND INTRAOCULAR LENS PLACEMENT (IOC);  Surgeon: Estill Cotta, MD;  Location: [facility];  Service: Ophthalmology;  Laterality: Right;  US:1:06.3 AP%:23.3 CDE:24.97 Fluid lot# [id number]  . CEREBRAL ANEURYSM REPAIR     coil and shunt placement  . CYSTOSCOPY     litho x 2  . EYE SURGERY    . KNEE ARTHROSCOPY    . WRIST SURGERY     ctr    Allergies  Allergen Reactions  . Buspirone Diarrhea  . Ceftriaxone Rash    Current Outpatient Medications  Medication Sig Dispense Refill  . amLODipine (NORVASC) 5 MG tablet Take 1 tablet (5 mg total) by mouth daily. 90 tablet 3  . aspirin 81 MG tablet Take 81 mg by mouth daily.    Marland Kitchen atorvastatin (LIPITOR) 40 MG tablet Take 40 mg by mouth daily.    . calcium citrate (CALCITRATE - DOSED IN MG ELEMENTAL CALCIUM) 950 MG tablet Take 200 mg of elemental calcium by mouth 2 (two) times daily.     . carvedilol (COREG) 6.25 MG tablet TAKE ONE TABLET BY MOUTH TWICE A DAY 180 tablet 2  . citalopram (CELEXA) 40 MG tablet TAKE 1 TABLET (40 MG TOTAL) BY MOUTH DAILY. 30 tablet 0  . finasteride (PROSCAR) 5 MG tablet Take 5 mg by mouth daily.    . magnesium oxide (MAG-OX) 400 MG tablet Take 400 mg by mouth daily.    . Potassium Chloride ER 20 MEQ TBCR TAKE ONE TABLET BY MOUTH TWICE A DAY (Patient taking differently: Taking 1 tablet daiy) 180 tablet 3  . sacubitril-valsartan (ENTRESTO) 97-103 MG Take 1 tablet by mouth 2 (two) times daily. 180 tablet 2  . spironolactone (ALDACTONE) 25 MG tablet TAKE 1/2 TABLET BY MOUTH DAILY 45 tablet 3  . vitamin B-12 (CYANOCOBALAMIN) 500 MCG tablet Take by mouth.    . zolpidem (AMBIEN) 5 MG tablet Take 1 tablet (5 mg total) by mouth at bedtime as needed for sleep. 30 tablet 0   No current facility-administered medications for this visit.    Social History   Socioeconomic History  . Marital status: Married  Spouse name:  Not on file  . Number of children: 4  . Years of education: 68  . Highest education level: Not on file  Occupational History  . Occupation: Retired  Tobacco Use  . Smoking status: Former Research scientist (life sciences)  . Smokeless tobacco: Current User    Types: Snuff  Substance and Sexual Activity  . Alcohol use: No  . Drug use: No  . Sexual activity: Not on file  Other Topics Concern  . Not on file  Social History Narrative   Fun: Fish, woodworking   Social Determinants of Health   Financial Resource Strain:   . Difficulty of Paying Living Expenses: Not on file  Food Insecurity:   . Worried About Charity fundraiser in the Last Year: Not on file  . Ran Out of Food in the Last Year: Not on file  Transportation Needs:   . Lack of Transportation (Medical): Not on file  . Lack of Transportation (Non-Medical): Not on file  Physical Activity:   . Days of Exercise per Week: Not on file  . Minutes of Exercise per Session: Not on file  Stress:   . Feeling of Stress : Not on file  Social Connections:   . Frequency of Communication with Friends and Family: Not on file  . Frequency of Social Gatherings with Friends and Family: Not on file  . Attends Religious Services: Not on file  . Active Member of Clubs or Organizations: Not on file  . Attends Archivist Meetings: Not on file  . Marital Status: Not on file  Intimate Partner Violence:   . Fear of Current or Ex-Partner: Not on file  . Emotionally Abused: Not on file  . Physically Abused: Not on file  . Sexually Abused: Not on file   Additional social history is notable that he is married for 43 years.  His 4 children, 9 grandchildren, and 2 great-grandchildren.  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  He completed 12 grades years of education.  He had smoked for 30 years but quit tobacco in 2003.  There is no EtOH use.    Family History  Problem Relation Age of Onset  . Heart disease Mother   . Prostate  cancer Father   . Healthy Maternal Grandmother   . Healthy Maternal Grandfather   . Healthy Paternal Grandmother   . Healthy Paternal Grandfather     ROS General: Negative; No fevers, chills, or night sweats HEENT: Negative; No changes in vision or hearing, sinus congestion, difficulty swallowing Pulmonary: Negative; No cough, wheezing, shortness of breath, hemoptysis Cardiovascular:  See HPI;  GI: Negative; No nausea, vomiting, diarrhea, or abdominal pain GU: Remote history of bladder tumor. Musculoskeletal: Negative; no myalgias, joint pain, or weakness Hematologic/Oncologic: Negative; no easy bruising, bleeding Endocrine: Negative; no heat/cold intolerance; no diabetes Neuro: Negative; no changes in balance, headaches Skin: Negative; No rashes or skin lesions Psychiatric: Negative; No behavioral problems, depression Sleep:  Positive for sleep apnea on CPAP ; positive for difficulty with sleep initiation Other comprehensive 14 point system review is negative   Physical Exam BP (!) 97/59   Pulse (!) 54   Ht _0  (1.778 m)   Wt 203 lb 9.6 oz (92.4 kg)   SpO2 99%   BMI 29.21 kg/m    Repeat blood pressure by me was 122/68 supine and 98/60 standing  Wt Readings from Last 3 Encounters:  07/07/16 196 lb (88.9 kg)  01/05/16 202  lb (91.6 kg)  07/13/15 202 lb 6 oz (91.8 kg)   General: Alert, oriented, no distress.  Skin: normal turgor, no rashes, warm and dry HEENT: Normocephalic, atraumatic. Pupils equal round and reactive to light; sclera anicteric; extraocular muscles intact;  Nose without nasal septal hypertrophy Mouth/Parynx benign; Mallinpatti scale 3 Neck: No JVD, no carotid bruits; normal carotid upstroke Lungs: clear to ausculatation and percussion; no wheezing or rales Chest wall: without tenderness to palpitation Heart: PMI not displaced, RRR, s1 s2 normal, 1/6 systolic murmur, no diastolic murmur, no rubs, gallops, thrills, or heaves Abdomen: soft, nontender; no  hepatosplenomehaly, BS+; abdominal aorta nontender and not dilated by palpation. Back: no CVA tenderness Pulses 2+ Musculoskeletal: full range of motion, normal strength, no joint deformities Extremities: no clubbing cyanosis or edema, Homan's sign negative  Neurologic: grossly nonfocal; Cranial nerves grossly wnl Psychologic: Normal mood and affect   ECG (independently read by me): Sinus bradycardia at 54 bpm, first-degree AV block, left bundle branch block  September 2020 ECG (independently read by me): Bradycardia 57 bpm, first-degree degree AV block with PR interval 214 ms; left bundle branch block.  March 2020 ECG (independently read by me): Sinus bradycardia 51 bpm.  Left bundle branch block.  First-degree AV block with appeared in with 210 ms.  January 11, 2018 ECG (independently read by me): Sinus bradycardia 52 bpm.  First-degree AV block with appeared normal at 220 ms.  Left bundle branch block.  November 30, 2017 ECG (independently read by me): Sinus bradycardia 57 bpm.  Left bundle branch block with repolarization changes.  Right superior axis.  October 05, 2017 ECG (independently read by me): Sinus bradycardia 53 bpm.  First-degree AV block.  Left bundle branch block with repolarization changes.  July 2019 ECG (independently read by me): Sinus bradycardia 52 bpm with first-degree AV block.  Left bundle branch block.  June 2018 ECG (independently read by me): Sinus bradycardia 53 bpm with first-degree AV block.  PR interval 210 ms.  Left bundle branch block with repolarization changes.  Left axis deviation.  December 2017 ECG (independently read by me): sinus bradycardia 53 bpm with left bundle branch block.  First degree AV block.  December 2016 ECG (independently read by me):  Sinus bradycardia at 56 bpm with first-degree AV block with a PR interval at 216.  Left bundle branch block with repolarization changes.  10/14/2014 ECG (independently read by me): Sinus bradycardia  52 bpm per first-degree AV block with a PR interval of 212 ms.  Left bundle branch block with repolarization changes.  LABS:  BMP Latest Ref Rng & Units 06/23/2019 06/22/2019 06/22/2019  Glucose 70 - 99 mg/dL 120(H) 128(H) 138(H)  BUN 8 - 23 mg/dL _0 Creatinine 0.61 - 1.24 mg/dL 1.10 0.90 1.06  BUN/Creat Ratio 10 - 24 - - -  Sodium 135 - 145 mmol/L 140 138 140  Potassium 3.5 - 5.1 mmol/L 4.4 3.9 3.8  Chloride 98 - 111 mmol/L 107 102 107  CO2 22 - 32 mmol/L 27 - 23  Calcium 8.9 - 10.3 mg/dL 8.6(L) - 9.1     Hepatic Function Latest Ref Rng & Units 06/22/2019 01/05/2016 01/19/2015  Total Protein 6.5 - 8.1 g/dL 7.1 6.4 6.7  Albumin 3.5 - 5.0 g/dL 4.0 3.9 4.0  AST 15 - 41 U/L _1 ALT 0 - 44 U/L _2 Alk Phosphatase 38 - 126 U/L 90 93 106  Total Bilirubin 0.3 - 1.2 mg/dL 0.8  0.9 0.9    CBC Latest Ref Rng & Units 06/22/2019 06/22/2019 01/05/2016  WBC 4.0 - 10.5 K/uL - 10.0 5.9  Hemoglobin 13.0 - 17.0 g/dL 12.9(L) 13.6 13.3  Hematocrit 39 - 52 % 38.0(L) 41.2 40.0  Platelets 150 - 400 K/uL - 180 144   Lab Results  Component Value Date   MCV 91.8 06/22/2019   MCV 89.3 01/05/2016   MCV 87.1 01/19/2015   Lab Results  Component Value Date   TSH 2.79 01/05/2016   Lab Results  Component Value Date   HGBA1C 5.8 (H) 06/22/2019     BNP No results found for: BNP  ProBNP    Component Value Date/Time   PROBNP 470 01/29/2018 1001     Lipid Panel     Component Value Date/Time   CHOL 96 06/22/2019 2334   TRIG 60 06/22/2019 2334   HDL 37 (L) 06/22/2019 2334   CHOLHDL 2.6 06/22/2019 2334   VLDL 12 06/22/2019 2334   LDLCALC 47 06/22/2019 2334    RADIOLOGY: No results found.  IMPRESSION:  1. CAD in native artery   2. Cardiomyopathy, ischemic   3. Essential hypertension   4. OSA (obstructive sleep apnea)   5. Hyperlipidemia LDL goal <70   6. Ascending aortic aneurysm ([place])   7. LBBB (left bundle branch block)      ASSESSMENT AND PLAN: Mr. 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  He was  found to have reduced LV function with lateral dyssynergy most likely contributed by his underlying left bundle branch block.  A follow-up echo Doppler study in August 2018 showed further reduction in LV function  with an EF of 20 to 25%.  There was moderate LVH with grade 1 diastolic dysfunction.  He had mild AR, mild dilation of his ascending aorta, mitral annular calcification with mild MR and moderate left atrial enlargement.  He was ultimately started on Entresto with titration up to maximum dose over several office visits.  He continues to be euvolemic and has no symptoms suggestive of volume overload or CHF.  His most recent echo Doppler study in May 2021 showed an EF of 30 to 35% with grade 1 diastolic dysfunction, mild MR and aneurysm of his ascending aorta measuring 45 mm.  He had moderate left atrial dilatation.  I again reviewed with him the echo Doppler data and also discussed with him potential prophylactic ICD since LV function has not significantly improved.  Presently he feels well and at present does not wish to pursue this.  His blood pressure today is mildly orthostatic.  I have recommended reduction of amlodipine dose from 10 mg down to 5 mg.  He will continue with carvedilol 6.25 mg twice a day, Entresto 97/103 twice a day in addition to spironolactone 12.5 mg daily.  He continues to be on atorvastatin 40 mg.  LDL cholesterol in September 19, 2019 was excellent at 42.  He continues to use CPAP therapy and denies any residual daytime sleepiness or breakthrough snoring.  I will see him in 4 months for reevaluation   Troy Sine, MD, [facility] 10/24/2019 5:52 PM

## 2019-10-23 NOTE — Patient Instructions (Signed)
Medication Instructions:  DECREASE YOUR AMLODIPINE TO 5MG  DAILY *If you need a refill on your cardiac medications before your next appointment, please call your pharmacy*  Follow-Up: At [facility], you and your health needs are our priority.  As part of our continuing mission to provide you with exceptional heart care, we have created designated Provider Care Teams.  These Care Teams include your primary Cardiologist (physician) and Advanced Practice Providers (APPs -  Physician Assistants and Nurse Practitioners) who all work together to provide you with the care you need, when you need it.  We recommend signing up for the patient portal called "MyChart".  Sign up information is provided on this After Visit Summary.  MyChart is used to connect with patients for Virtual Visits (Telemedicine).  Patients are able to view lab/test results, encounter notes, upcoming appointments, etc.  Non-urgent messages can be sent to your provider as well.   To learn more about what you can do with MyChart, go to [email or web address].    Your next appointment:   4 month(s)  The format for your next appointment:   In Person  Provider:   Shelva Majestic, MD

## 2019-10-24 ENCOUNTER — Encounter: Payer: Self-pay | Admitting: Cardiovascular Disease

## 2019-10-28 ENCOUNTER — Other Ambulatory Visit: Payer: Self-pay | Admitting: Cardiovascular Disease

## 2019-11-16 ENCOUNTER — Other Ambulatory Visit: Payer: Self-pay | Admitting: Cardiovascular Disease

## 2019-12-03 ENCOUNTER — Other Ambulatory Visit: Payer: Self-pay | Admitting: Cardiovascular Disease

## 2019-12-04 ENCOUNTER — Other Ambulatory Visit: Payer: Self-pay | Admitting: Cardiovascular Disease

## 2019-12-09 DIAGNOSIS — G4733 Obstructive sleep apnea (adult) (pediatric): Secondary | ICD-10-CM | POA: Diagnosis not present

## 2019-12-18 DIAGNOSIS — H353131 Nonexudative age-related macular degeneration, bilateral, early dry stage: Secondary | ICD-10-CM | POA: Diagnosis not present

## 2020-02-12 ENCOUNTER — Other Ambulatory Visit: Payer: Self-pay | Admitting: Cardiovascular Disease

## 2020-02-26 ENCOUNTER — Encounter: Payer: Self-pay | Admitting: Cardiovascular Disease

## 2020-02-26 ENCOUNTER — Other Ambulatory Visit: Payer: Self-pay

## 2020-02-26 ENCOUNTER — Ambulatory Visit: Payer: PPO | Admitting: Cardiovascular Disease

## 2020-02-26 VITALS — BP 134/64 | HR 57 | Ht 69.0 in | Wt 204.8 lb

## 2020-02-26 DIAGNOSIS — E785 Hyperlipidemia, unspecified: Secondary | ICD-10-CM

## 2020-02-26 DIAGNOSIS — I429 Cardiomyopathy, unspecified: Secondary | ICD-10-CM

## 2020-02-26 DIAGNOSIS — Z79899 Other long term (current) drug therapy: Secondary | ICD-10-CM | POA: Diagnosis not present

## 2020-02-26 DIAGNOSIS — I712 Thoracic aortic aneurysm, without rupture: Secondary | ICD-10-CM | POA: Diagnosis not present

## 2020-02-26 DIAGNOSIS — G4733 Obstructive sleep apnea (adult) (pediatric): Secondary | ICD-10-CM | POA: Diagnosis not present

## 2020-02-26 DIAGNOSIS — I7121 Aneurysm of the ascending aorta, without rupture: Secondary | ICD-10-CM

## 2020-02-26 DIAGNOSIS — I447 Left bundle-branch block, unspecified: Secondary | ICD-10-CM

## 2020-02-26 DIAGNOSIS — I255 Ischemic cardiomyopathy: Secondary | ICD-10-CM | POA: Diagnosis not present

## 2020-02-26 MED ORDER — POTASSIUM CHLORIDE ER 20 MEQ PO TBCR
EXTENDED_RELEASE_TABLET | ORAL | 3 refills | Status: DC
Start: 2020-02-26 — End: 2021-05-19

## 2020-02-26 MED ORDER — POTASSIUM CHLORIDE ER 20 MEQ PO TBCR
EXTENDED_RELEASE_TABLET | ORAL | 3 refills | Status: DC
Start: 2020-02-26 — End: 2020-02-26

## 2020-02-26 NOTE — Progress Notes (Signed)
Patient ID: Trevor Schmidt, male   DOB: 1940-02-16, 80 y.o.   MRN: [id number]     Primary: [name], FNP  HPI:  Trevor Schmidt is a 80 year old white male who established care with me in September 2016.  I last saw him in September 2021 he presents for a 49-monthfollow-up evaluation.  Trevor Schmidt a history of hypertension, and previous diagnosis of mitral valve prolapse.  Remotely, he had seen [name]  In 2001 he had undergone a cardiac catheterization and was not told of having significant coronary blockage.  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  [place]  He had seen [name]  He has a history of obstructive sleep apnea and has been on CPAP therapy.  He received a new machine in February 2016 after his old machine malfunction.  A review of records from the [facility] reveals that there is also a history of remote PAF, hyperlipidemia, essential hypertension.  He denies any chest pain.  He is unaware of any recent cardiac arrhythmia.  He is using CPAP with 100% compliance.  He also has a history of hyperlipidemia and has been on atorvastatin.    When I initially saw him, I scheduled him for an echo Doppler study and nuclear stress test.  The echo Doppler study showed an ejection fraction of 30% with mild LVH and severe hypokinesis of the septal wall and inferior walls.  There was septal lateral dyssynergy.  There was grade 1 diastolic dysfunction.  There was no evidence for aortic valve stenosis, but there was mild aortic insufficiency.  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  He had mitral annular calcification with trivial Trevor, mild left atrial dilatation, and mild right atrial dilatation, he underwent a nuclear perfusion study which revealed concordant data with an ejection fraction of 34%.  He has underlying left bundle branch block.  A reversible septal and inferior  perfusion defect was demonstrated.    He underwent cardiac catheterization on 11/28/2014 which revealed CAD with a very short short left main, which immediately bifurcated into the LAD and left circumflex vessel. The LAD had 30% proximal stenosis immediately after the first diagonal branch. The circumflex vessel was a large caliber vessel, and there was extensive collateralization to the distal RCA branches. The RCA immediately gave rise to a conus branch and was tortuous. There was diffuse 50-60% stenoses proximal and medially beyond the acute margin prior to a large PDA vessel. The distal RCA after second small inferior LV branch was totally occluded. There were extensive collateralization to the distal RCA via the left injection.  He was felt that his inferior ischemia seen on the nuclear study was due to his distal RCA occlusion.  Medical therapy was recommended.  In June 2017 a f/u echo Doppler study showed an EF of 35% with Gr I DD.   His aortic root was mildly dioated at 40 mm.  Was aortic valve sclerosis without stenosis.  There was diffuse hypokinesis with septal lateral dyssynergy.  He had laboratory done by [name] February 2019 which showed a BUN of 11 creatinine 0.9.  LFTs were normal.  He continues to be on atorvastatin for hyperlipidemia and recent lipid studies were excellent with a total cholesterol 104, triglycerides 89, LDL 43.  He continues to use CPAP with 100% compliance and has a ResMed AirSense 10 CPAP machine. His  DME company is [employer].  He has remote history of bladder cancer and is followed by Dr. Karsten Ro.    A follow-up echo Doppler study in September 04, 2017  demonstrated severe global LV dysfunction with an EF of 20 to 25%, moderate LVH, grade 1 diastolic dysfunction, mild aortic insufficiency, mild dilation of his a sending aorta, mitral annular calcification with mild Trevor, and moderate left atrial enlargement.  His EF had decreased since his last evaluation.  He noted  shortness of breath with exertion.  He can walk at a slow pace up to a mile and a half without shortness of breath. He had recent laboratory done by his primary physician and his potassium was low at 3.4.  He was on 20 mill equivalents of potassium twice a day in addition to doxazosin 2 mg, olmesartan 40 mg, amlodipine 10 mg.   When I saw him on October 05, 2017, I recommended transition to [facility] and discontinued olmesartan.  I also recommended that he reduce his KCl to 20 mEq daily.  He saw [name], Pharm.D. on October 31, 2017.  He was tolerating his medication well without dizziness or chest pain.  His potassium was still 3.3 checked the day before and he was advised to resume the 20 mg once twice a day of potassium supplementation.  Since his blood pressure was 112/62 [name] was not further titrated.  When I saw him on November 30, 2017 he was doing well on Entresto 49/51 mg twice a day in place of olmesartan.  BP during that evaluation was stable and I added low-dose spironolactone 12.5 mg for aldosterone blockade.  Follow-up bmet on December 14, 2017 showed creatinine of 1.12 and potassium 4.2.  I saw him on January 11, 2018.  At that time, his blood pressure was 122/78.  I recommended he discontinue doxazosin and further titrated Entresto to 97/103 mg bid.  He has felt improved on maximal therapy.  He underwent a follow-up echo Doppler study on April 11, 2018 which shows slight improvement in ejection fraction now at 25-30.  His left atrium was moderately dilated.  There was mild aortic sclerosis without stenosis.  There was mild dilation of his ascending aorta.  Repeat laboratory in [name] office on March 12, 2018 showed stable renal function with a creatinine of 1.11.  Potassium was 4.4.   I saw him in March 2020.  He remained stable and denied any symptoms of CHF.  An echo Doppler study on April 11, 2018 continue to show an EF of 25 to 30% with mild concentric LVH and grade 1  diastolic dysfunction.  There was mitral annular calcification and aortic sclerosis with mild AR.  At the time he denied chest pain PND orthopnea or symptoms of CHF.  He was continuing to use CPAP with 100% compliance.   I  saw him in September 2020 which continue to be stable.  He was walking at least 1-1/2 miles per day and denied any change in exercise tolerance. He underwent complete set of laboratory at Dr. Pennie Banter office on September 13, 2018 which remained stable.  Hemoglobin hematocrit were 12.7/37.8.  Leukos was minimally increased at 105.  Renal function was stable with a creatinine of 1.19 and he had normal LFTs.  Lipid studies were excellent with total cholesterol 108, HDL 37, LDL 41 and triglycerides 150.  TSH was normal at 3.17.    I last saw him in September 2021 at which time he continued to do well.  He was cutting grass and denied any shortness of breath with activity.  He was continuing to walk at least 1/2 mile per day and was unaware of any presyncope or syncope. He was evaluated by Roby Lofts in June 2021 and with his reduced EF in the 30 to 35% range which had improved with [name] she discussed the possibility of EP evaluation for consideration of prophylactic ICD.  During my evaluation, I also had further discussion concerning possible prophylactic ICD placement.  At the time he was still hesitant to pursuing this option.  His blood pressure was mildly orthostatic during that evaluation I reduced his amlodipine from 10 mg down to 5 mg.  He was to continue his carvedilol 6.25 mg twice a day, Entresto 97/103 mg twice a day in addition to spironolactone 12.5 mg.  He continued to be on atorvastatin 40 mg with an LDL in August 2021 at 59.  He continued to use CPAP and denied residual daytime sleepiness.  Presently, Trevor Schmidt feels well.  He has given further thought and presents to the office today stating that he has decided to be evaluated for prophylactic ICD in light of his reduced  LV function.  He is unaware of any palpitations.  He presents for reevaluation.   Past Medical History:  Diagnosis Date  . Acute bronchitis 02/04/2015  . Arthritis   . Cancer ([place])    bladder  . Confusion   . Depression   . Dysrhythmia   . Edema    feet/ankles  . GERD (gastroesophageal reflux disease)   . HOH (hard of hearing)    aids  . Hypertension   . Kidney stones   . MVP (mitral valve prolapse)   . Prostate disease   . Sleep apnea     Past Surgical History:  Procedure Laterality Date  . BACK SURGERY    . BLADDER SURGERY     cancer  . CARDIAC CATHETERIZATION N/A 12/09/2014   Procedure: Left Heart Cath and Coronary Angiography;  Surgeon: Troy Sine, MD;  Location: [facility];  Service: Cardiovascular;  Laterality: N/A;  . CATARACT EXTRACTION W/PHACO Right 09/08/2014   Procedure: CATARACT EXTRACTION PHACO AND INTRAOCULAR LENS PLACEMENT (IOC);  Surgeon: Estill Cotta, MD;  Location: [facility] ORS;  Service: Ophthalmology;  Laterality: Right;  US:1:06.3 AP%:23.3 CDE:24.97 Fluid lot# [id number]  . CEREBRAL ANEURYSM REPAIR     coil and shunt placement  . CYSTOSCOPY     litho x 2  . EYE SURGERY    . KNEE ARTHROSCOPY    . WRIST SURGERY     ctr    Allergies  Allergen Reactions  . Buspirone Diarrhea  . Ceftriaxone Rash    Current Outpatient Medications  Medication Sig Dispense Refill  . amLODipine (NORVASC) 5 MG tablet Take 1 tablet (5 mg total) by mouth daily. 90 tablet 3  . aspirin 81 MG tablet Take 81 mg by mouth daily.    Marland Kitchen atorvastatin (LIPITOR) 40 MG tablet Take 40 mg by mouth daily.    . carvedilol (COREG) 6.25 MG tablet TAKE ONE TABLET BY MOUTH TWICE A DAY 180 tablet 2  . citalopram (CELEXA) 40 MG tablet TAKE 1 TABLET (40 MG TOTAL) BY MOUTH DAILY. 30 tablet 0  . ENTRESTO 97-103 MG TAKE ONE TABLET BY MOUTH TWICE A DAY 180 tablet 2  . finasteride (PROSCAR) 5 MG tablet Take 5 mg by mouth daily.    . magnesium oxide (MAG-OX) 400 MG tablet Take 400 mg by  mouth daily.    Marland Kitchen spironolactone (ALDACTONE) 25 MG tablet TAKE 1/2 TABLET BY MOUTH DAILY 45 tablet 2  . vitamin B-12 (CYANOCOBALAMIN) 500 MCG tablet Take by mouth.    . zolpidem (AMBIEN) 5 MG tablet Take 1 tablet (5 mg total) by mouth at bedtime as needed for sleep. 30 tablet 0  . Multiple Vitamins-Minerals (OCUVITE-LUTEIN PO) 2 capsule    . Potassium Chloride ER 20 MEQ TBCR Taking 1 tablet daiy 180 tablet 3   No current facility-administered medications for this visit.    Social History   Socioeconomic History  . Marital status: Married    Spouse name: Not on file  . Number of children: 4  . Years of education: 48  . Highest education level: Not on file  Occupational History  . Occupation: Retired  Tobacco Use  . Smoking status: Former Research scientist (life sciences)  . Smokeless tobacco: Current User    Types: Snuff  Substance and Sexual Activity  . Alcohol use: No  . Drug use: No  . Sexual activity: Not on file  Other Topics Concern  . Not on file  Social History Narrative   Fun: Fish, woodworking   Social Determinants of Health   Financial Resource Strain: Not on file  Food Insecurity: Not on file  Transportation Needs: Not on file  Physical Activity: Not on file  Stress: Not on file  Social Connections: Not on file  Intimate Partner Violence: Not on file   Additional social history is notable that he is married for 43 years.  His 4 children, 9 grandchildren, and 2 great-grandchildren.  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  He completed 12 grades years of education.  He had smoked for 30 years but quit tobacco in 2003.  There is no EtOH use.    Family History  Problem Relation Age of Onset  . Heart disease Mother   . Prostate cancer Father   . Healthy Maternal Grandmother   . Healthy Maternal Grandfather   . Healthy Paternal Grandmother   . Healthy Paternal Grandfather     ROS General: Negative; No fevers, chills, or night sweats HEENT: Negative;  No changes in vision or hearing, sinus congestion, difficulty swallowing Pulmonary: Negative; No cough, wheezing, shortness of breath, hemoptysis Cardiovascular:  See HPI;  GI: Negative; No nausea, vomiting, diarrhea, or abdominal pain GU: Remote history of bladder tumor. Musculoskeletal: Negative; no myalgias, joint pain, or weakness Hematologic/Oncologic: Negative; no easy bruising, bleeding Endocrine: Negative; no heat/cold intolerance; no diabetes Neuro: Negative; no changes in balance, headaches Skin: Negative; No rashes or skin lesions Psychiatric: Negative; No behavioral problems, depression Sleep:  Positive for sleep apnea on CPAP ; positive for difficulty with sleep initiation Other comprehensive 14 point system review is negative   Physical Exam BP 134/64 (BP Location: Left Arm, Patient Position: Sitting)   Pulse (!) 57   Ht '5\' 9"'  (1.753 m)   Wt 204 lb 12.8 oz (92.9 kg)   SpO2 97%   BMI 30.24 kg/m    Repeat blood pressure by me was 122/68 supine and 98/60 standing  Wt Readings from Last 3 Encounters:  07/07/16 196 lb (88.9 kg)  01/05/16 202 lb (91.6 kg)  07/13/15 202 lb 6 oz (91.8 kg)   General: Alert, oriented, no distress.  Skin: normal turgor, no rashes, warm and dry HEENT: Normocephalic, atraumatic. Pupils equal round and reactive to light; sclera anicteric; extraocular muscles intact;  Nose without nasal septal hypertrophy Mouth/Parynx benign;  Mallinpatti scale 3 Neck: No JVD, no carotid bruits; normal carotid upstroke Lungs: clear to ausculatation and percussion; no wheezing or rales Chest wall: without tenderness to palpitation Heart: PMI not displaced, RRR, s1 s2 normal, 1/6 systolic murmur, no diastolic murmur, no rubs, gallops, thrills, or heaves Abdomen: soft, nontender; no hepatosplenomehaly, BS+; abdominal aorta nontender and not dilated by palpation. Back: no CVA tenderness Pulses 2+ Musculoskeletal: full range of motion, normal strength, no joint  deformities Extremities: no clubbing cyanosis or edema, Homan's sign negative  Neurologic: grossly nonfocal; Cranial nerves grossly wnl Psychologic: Normal mood and affect   ECG (independently read by me): Sinus bradycardia at 57; LBBB, QRS  168 msec; QTc 467 msec  May 2022 ECG (independently read by me): Sinus bradycardia at 54 bpm, first-degree AV block, left bundle branch block  September 2020 ECG (independently read by me): Bradycardia 57 bpm, first-degree degree AV block with PR interval 214 ms; left bundle branch block.  March 2020 ECG (independently read by me): Sinus bradycardia 51 bpm.  Left bundle branch block.  First-degree AV block with appeared in with 210 ms.  January 11, 2018 ECG (independently read by me): Sinus bradycardia 52 bpm.  First-degree AV block with appeared normal at 220 ms.  Left bundle branch block.  November 30, 2017 ECG (independently read by me): Sinus bradycardia 57 bpm.  Left bundle branch block with repolarization changes.  Right superior axis.  October 05, 2017 ECG (independently read by me): Sinus bradycardia 53 bpm.  First-degree AV block.  Left bundle branch block with repolarization changes.  July 2019 ECG (independently read by me): Sinus bradycardia 52 bpm with first-degree AV block.  Left bundle branch block.  June 2018 ECG (independently read by me): Sinus bradycardia 53 bpm with first-degree AV block.  PR interval 210 ms.  Left bundle branch block with repolarization changes.  Left axis deviation.  December 2017 ECG (independently read by me): sinus bradycardia 53 bpm with left bundle branch block.  First degree AV block.  December 2016 ECG (independently read by me):  Sinus bradycardia at 56 bpm with first-degree AV block with a PR interval at 216.  Left bundle branch block with repolarization changes.  10/14/2014 ECG (independently read by me): Sinus bradycardia 52 bpm per first-degree AV block with a PR interval of 212 ms.  Left bundle  branch block with repolarization changes.  LABS:  BMP Latest Ref Rng & Units 02/26/2020 06/23/2019 06/22/2019  Glucose 65 - 99 mg/dL 75 120(H) 128(H)  BUN 8 - 27 mg/dL '17 22 20  ' Creatinine 0.76 - 1.27 mg/dL 1.04 1.10 0.90  BUN/Creat Ratio 10 - 24 16 - -  Sodium 134 - 144 mmol/L 142 140 138  Potassium 3.5 - 5.2 mmol/L 4.8 4.4 3.9  Chloride 96 - 106 mmol/L 105 107 102  CO2 20 - 29 mmol/L 25 27 -  Calcium 8.6 - 10.2 mg/dL 9.5 8.6(L) -     Hepatic Function Latest Ref Rng & Units 02/26/2020 06/22/2019 01/05/2016  Total Protein 6.0 - 8.5 g/dL 6.8 7.1 6.4  Albumin 3.7 - 4.7 g/dL 4.4 4.0 3.9  AST 0 - 40 IU/L '19 20 17  ' ALT 0 - 44 IU/L '21 21 16  ' Alk Phosphatase 44 - 121 IU/L 96 90 93  Total Bilirubin 0.0 - 1.2 mg/dL 0.8 0.8 0.9    CBC Latest Ref Rng & Units 06/22/2019 06/22/2019 01/05/2016  WBC 4.0 - 10.5 K/uL - 10.0 5.9  Hemoglobin 13.0 - 17.0 g/dL 12.9(L)  13.6 13.3  Hematocrit 39.0 - 52.0 % 38.0(L) 41.2 40.0  Platelets 150 - 400 K/uL - 180 144   Lab Results  Component Value Date   MCV 91.8 06/22/2019   MCV 89.3 01/05/2016   MCV 87.1 01/19/2015   Lab Results  Component Value Date   TSH 2.79 01/05/2016   Lab Results  Component Value Date   HGBA1C 5.8 (H) 06/22/2019     BNP No results found for: BNP  ProBNP    Component Value Date/Time   PROBNP 470 01/29/2018 1001     Lipid Panel     Component Value Date/Time   CHOL 96 06/22/2019 2334   TRIG 60 06/22/2019 2334   HDL 37 (L) 06/22/2019 2334   CHOLHDL 2.6 06/22/2019 2334   VLDL 12 06/22/2019 2334   LDLCALC 47 06/22/2019 2334    RADIOLOGY: No results found.  IMPRESSION:  1. Ischemic cardiomyopathy   2. LBBB (left bundle branch block)   3. OSA (obstructive sleep apnea)   4. Hyperlipidemia LDL goal <70   5. Ascending aortic aneurysm ([place])   6. 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  He was  found to have reduced LV function with lateral dyssynergy most likely contributed by his underlying left bundle branch block.  A follow-up echo Doppler study in August 2018 showed further reduction in LV function  with an EF of 20 to 25%.  There was moderate LVH with grade 1 diastolic dysfunction.  He had mild AR, mild dilation of his ascending aorta, mitral annular calcification with mild Trevor and moderate left atrial enlargement.  He was ultimately started on Entresto with titration up to maximum dose over several office visits.  He continues to be euvolemic and has no symptoms suggestive of volume overload or CHF.  His most recent echo Doppler study in May 2021 showed an EF of 30 to 35% with grade 1 diastolic dysfunction, mild Trevor and aneurysm of his ascending aorta measuring 45 mm.  He had moderate left atrial dilatation.  When I last saw him, amlodipine dose was reduced due to mild orthostatic blood pressure.  This has resolved.  He continues to be on GDMT for his reduced LV function and in addition to his amlodipine 5 mg is on carvedilol 6.25 mg twice a day, Entresto 97/103 mg twice a day, and spironolactone 12.5 mg daily. I had significant discussion with him concerning prophylactic ICD.  He has now come to the decision that he would like to pursue this.  He is hopeful that he will would not require  defibrillations but if necessary the ICD will be potentially life-saving.  I am recommending follow-up chemistry and brain natruretic peptide.  He has left bundle branch block with QRS duration at 168 ms.  QTC interval is 468 ms.   I will refer him to our electrophysiologist for 4 prophylactic ICD implantation.  He needs to be on atorvastatin 40 mg with LDL cholesterol in August excellent at 77.  He continues to use CPAP for his obstructive sleep apnea.  Troy Sine, MD,  [facility] 02/27/2020 5:23 PM

## 2020-02-26 NOTE — Patient Instructions (Signed)
Medication Instructions:  The current medical regimen is effective;  continue present plan and medications as directed. Please refer to the Current Medication list given to you today.  *If you need a refill on your cardiac medications before your next appointment, please call your pharmacy*  Lab Work:   Testing/Procedures:  NONE    NONE  Special Instructions REFERRAL TO EP FOR DEFIBRILLATOR   Follow-Up: Your next appointment:  6 month(s) In Person with Shelva Majestic, MD OR IF UNAVAILABLE Victor, FNP-C   At [facility], you and your health needs are our priority.  As part of our continuing mission to provide you with exceptional heart care, we have created designated Provider Care Teams.  These Care Teams include your primary Cardiologist (physician) and Advanced Practice Providers (APPs -  Physician Assistants and Nurse Practitioners) who all work together to provide you with the care you need, when you need it.

## 2020-02-27 ENCOUNTER — Encounter: Payer: Self-pay | Admitting: Cardiovascular Disease

## 2020-02-27 LAB — COMPREHENSIVE METABOLIC PANEL
ALT: 21 IU/L (ref 0–44)
AST: 19 IU/L (ref 0–40)
Albumin/Globulin Ratio: 1.8 (ref 1.2–2.2)
Albumin: 4.4 g/dL (ref 3.7–4.7)
Alkaline Phosphatase: 96 IU/L (ref 44–121)
BUN/Creatinine Ratio: 16 (ref 10–24)
BUN: 17 mg/dL (ref 8–27)
Bilirubin Total: 0.8 mg/dL (ref 0.0–1.2)
CO2: 25 mmol/L (ref 20–29)
Calcium: 9.5 mg/dL (ref 8.6–10.2)
Chloride: 105 mmol/L (ref 96–106)
Creatinine, Ser: 1.04 mg/dL (ref 0.76–1.27)
GFR calc Af Amer: 79 mL/min/{1.73_m2} (ref >59)
GFR calc non Af Amer: 68 mL/min/{1.73_m2} (ref >59)
Globulin, Total: 2.4 g/dL (ref 1.5–4.5)
Glucose: 75 mg/dL (ref 65–99)
Potassium: 4.8 mmol/L (ref 3.5–5.2)
Sodium: 142 mmol/L (ref 134–144)
Total Protein: 6.8 g/dL (ref 6.0–8.5)

## 2020-03-05 NOTE — Progress Notes (Unsigned)
ELECTROPHYSIOLOGY CONSULT NOTE  Patient ID: Trevor Schmidt, MRN: [id number], DOB/AGE: 1940/06/16 80 y.o. Admit date: (Not on file) Date of Consult: 03/06/2020  Primary Physician: Trevor Pretty, MD Primary Cardiologist: Trevor Schmidt is a 80 y.o. male who is being seen today for the evaluation of ICD at the request of Trevor Schmidt.    HPI Trevor Schmidt is a 80 y.o. male referred for consideration of an ICD  A former patient of Dr. Mare Ferrari, seen by Dr. Leda Gauze and found to have left ventricular dysfunction with an EF of 30% and some wall motion abnormalities.  Underwent Myoview scanning and subsequent catheterization as noted below.  Also found to have left bundle branch block  He currently walks without chest pain or shortness of breath.  He finds that if he begins exercise each spring his legs struggle.  He is not a smoker. No nocturnal dyspnea orthopnea.  He does have treated sleep apnea.  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  Also bladder cancer   Past Medical History:  Diagnosis Date  . Acute bronchitis 02/04/2015  . Arthritis   . Cancer ([place])    bladder  . Confusion   . Depression   . Dysrhythmia   . Edema    feet/ankles  . GERD (gastroesophageal reflux disease)   . HOH (hard of hearing)    aids  . Hypertension   . Kidney stones   . MVP (mitral valve prolapse)   . Prostate disease   . Sleep apnea        Surgical History:  Past Surgical History:  Procedure Laterality Date  . BACK SURGERY    . BLADDER SURGERY     cancer  . CARDIAC CATHETERIZATION N/A 12/09/2014   Procedure: Left Heart Cath and Coronary Angiography;  Surgeon: Troy Sine, MD;  Location: [facility];  Service: Cardiovascular;  Laterality: N/A;  . CATARACT EXTRACTION W/PHACO Right 09/08/2014   Procedure: CATARACT EXTRACTION PHACO AND INTRAOCULAR LENS PLACEMENT (IOC);  Surgeon: Estill Cotta, MD;  Location: [facility];  Service: Ophthalmology;  Laterality: Right;  US:1:06.3 AP%:23.3 CDE:24.97 Fluid lot# [id number]  . CEREBRAL ANEURYSM REPAIR     coil and shunt placement  . CYSTOSCOPY     litho x 2  . EYE SURGERY    . KNEE ARTHROSCOPY    . WRIST SURGERY     ctr     Home Meds: Current Meds  Medication Sig  . amLODipine (NORVASC) 5 MG tablet Take 1 tablet (5 mg total) by mouth daily.  Marland Kitchen aspirin 81 MG tablet Take 81 mg by mouth daily.  Marland Kitchen atorvastatin (LIPITOR) 40 MG tablet Take 40 mg by mouth daily.  . carvedilol (COREG) 6.25 MG tablet TAKE ONE TABLET BY MOUTH TWICE A DAY  . citalopram (CELEXA) 40 MG tablet TAKE  1 TABLET (40 MG TOTAL) BY MOUTH DAILY.  Marland Kitchen ENTRESTO 97-103 MG TAKE ONE TABLET BY MOUTH TWICE A DAY  . finasteride (PROSCAR) 5 MG tablet Take 5 mg by mouth daily.  . magnesium oxide (MAG-OX) 400 MG tablet Take 400 mg by mouth daily.  . Multiple Vitamins-Minerals (OCUVITE-LUTEIN PO) 2 capsule  . Potassium Chloride ER 20 MEQ TBCR Taking 1 tablet daiy  . spironolactone (ALDACTONE) 25 MG tablet TAKE 1/2 TABLET BY MOUTH DAILY  . vitamin B-12 (CYANOCOBALAMIN) 500 MCG tablet Take by mouth.  . zolpidem (AMBIEN) 5 MG tablet Take 1 tablet (5 mg total) by mouth at bedtime as needed for sleep.    Allergies:  Allergies  Allergen Reactions  . Buspirone Diarrhea  . Ceftriaxone Rash    Social History   Socioeconomic History  . Marital status: Married    Spouse name: Not on file  . Number of children: 4   . Years of education: 69  . Highest education level: Not on file  Occupational History  . Occupation: Retired  Tobacco Use  . Smoking status: Former Research scientist (life sciences)  . Smokeless tobacco: Current User    Types: Snuff  Substance and Sexual Activity  . Alcohol use: No  . Drug use: No  . Sexual activity: Not on file  Other Topics Concern  . Not on file  Social History Narrative   Fun: Fish, woodworking   Social Determinants of Radio broadcast assistant Strain: Not on file  Food Insecurity: Not on file  Transportation Needs: Not on file  Physical Activity: Not on file  Stress: Not on file  Social Connections: Not on file  Intimate Partner Violence: Not on file     Family History  Problem Relation Age of Onset  . Heart disease Mother   . Prostate cancer Father   . Healthy Maternal Grandmother   . Healthy Maternal Grandfather   . Healthy Paternal Grandmother   . Healthy Paternal Grandfather      ROS:  Please see the history of present illness.     All other systems reviewed and negative.    Physical Exam: Blood pressure 132/70, pulse 60, height 5\' 9"  (1.753 m), weight 205 lb 12.8 oz (93.4 kg), SpO2 96 %. General: Well developed, well nourished male in no acute distress. Head: Normocephalic, atraumatic, sclera non-icteric, no xanthomas, nares are without discharge. EENT: normal  Lymph Nodes:  none Neck: Negative for carotid bruits. JVD not elevated. Back:without scoliosis kyphosis  Lungs: Clear bilaterally to auscultation without wheezes, rales, or rhonchi. Breathing is unlabored. Heart: RRR with S1 S2. No murmur . No rubs, or gallops appreciated. Abdomen: Soft, non-tender, non-distended with normoactive bowel sounds. No hepatomegaly. No rebound/guarding. No obvious abdominal masses. Msk:  Strength and tone appear normal for age. Extremities: No clubbing or cyanosis. No  edema.  Distal pedal pulses are 2+ and equal bilaterally. Skin: Warm and Dry Neuro: Alert and oriented X  3. CN III-XII intact Grossly normal sensory and motor function . Psych:  Responds to questions appropriately with a normal affect.      Labs: Cardiac Enzymes No results for input(s): CKTOTAL, CKMB, TROPONINI in the last 72 hours. CBC Lab Results  Component Value Date   WBC 10.0 06/22/2019   HGB 12.9 (L) 06/22/2019   HCT 38.0 (L) 06/22/2019   MCV 91.8 06/22/2019   PLT 180 06/22/2019   PROTIME: No results for input(s): LABPROT, INR in the last 72 hours. Chemistry No results for input(s): NA, K,  CL, CO2, BUN, CREATININE, CALCIUM, PROT, BILITOT, ALKPHOS, ALT, AST, GLUCOSE in the last 168 hours.  Invalid input(s): LABALBU Lipids Lab Results  Component Value Date   CHOL 96 06/22/2019   HDL 37 (L) 06/22/2019   LDLCALC 47 06/22/2019   TRIG 60 06/22/2019   BNP NT-Pro BNP  Date/Time Value Ref Range Status  01/29/2018 10:01 AM 470 0 - 486 pg/mL Final    Comment:    The following cut-points have been suggested for the use of proBNP for the diagnostic evaluation of heart failure (HF) in patients with acute dyspnea: Modality                     Age           Optimal Cut                            (years)            Point ------------------------------------------------------ Diagnosis (rule in HF)        <50            450 pg/mL                           50 - 75            900 pg/mL                               >75           1800 pg/mL Exclusion (rule out HF)  Age independent     300 pg/mL   10/23/2017 09:23 AM 380 0 - 486 pg/mL Final    Comment:    The following cut-points have been suggested for the use of proBNP for the diagnostic evaluation of heart failure (HF) in patients with acute dyspnea: Modality                     Age           Optimal Cut                            (years)            Point ------------------------------------------------------ Diagnosis (rule in HF)        <50            450 pg/mL                           50 - 75            900 pg/mL                                >75           1800 pg/mL Exclusion (rule out HF)  Age independent     300 pg/mL    Thyroid Function Tests: No results for input(s): TSH, T4TOTAL, T3FREE, THYROIDAB in the last 72 hours.  Invalid input(s): FREET3 Miscellaneous No results found for: DDIMER  Radiology/Studies:  No results found.  EKG: Sinus at 60 Intervals 21/17/47 Left axis deviation Left bundle branch block Sinus  20/17/4 LBBB/LAD  Assessment and Plan:    NICM  Coronary disease-modest  LBBB/LAD   CHF chronic systolic class IIa?   The patient has persistent LV dysfunction which has improved considerably concurrent with his Entresto use.  Last measured ejection fraction was 30-35%, just below cutoff for consideration of primary prevention ICDs.  The issue of an ICD for primary prevention in this 80 year old man with a nonischemic Cardiomyopathy are not at all clear.  First, the [place] trial failed to show benefit and potentially a signal for harm the patient is over 68 with nonischemic cardiomyopathy.  Moreover, his symptoms are borderline class II and ICDs for nonischemic myopathy patients is reserved for people with class II or greater symptoms.  In addition, his ejection fraction is borderline for consideration of an ICD as noted above.  I have suggested given these 3 concerns that there is no strong indication for an ICD for primary prevention, acknowledging that recent med analyses have suggested that the nonischemic issue may not be as important has been thought, that age restrictions may also not be as important as suggested by [place], but also that the [facility] study group is embarking on a new trial of primary prevention randomizing patients on more aggressive guideline directed therapy including Entresto to an ICD or no ICD.  The patient and his wife are comfortable with the recommendation that an ICD is not clearly indicated and we will forego it.  MD that is what they wanted here they  say.  The patient also has left bundle branch block raising the question as to whether he would be a candidate for CRT pacing.  His symptoms again are sufficiently modest that it is not clear to me that symptomatic improvement could be garnered even with effective CRT.  In this regard it is worth noting that left axis deviation has been shown to attenuate benefit of CRT    Virl Axe A total of 79  minutes was spent on this visit reviewing previous notes,  counseling the patient on (As above) for, and documenting the findings in the note.

## 2020-03-06 ENCOUNTER — Other Ambulatory Visit: Payer: Self-pay

## 2020-03-06 ENCOUNTER — Ambulatory Visit: Payer: PPO | Admitting: Internal Medicine

## 2020-03-06 ENCOUNTER — Encounter: Payer: Self-pay | Admitting: Internal Medicine

## 2020-03-06 VITALS — BP 132/70 | HR 60 | Ht 69.0 in | Wt 205.8 lb

## 2020-03-06 DIAGNOSIS — I255 Ischemic cardiomyopathy: Secondary | ICD-10-CM

## 2020-03-06 NOTE — Patient Instructions (Signed)
Medication Instructions:  Your physician recommends that you continue on your current medications as directed. Please refer to the Current Medication list given to you today.   *If you need a refill on your cardiac medications before your next appointment, please call your pharmacy*   Lab Work: None ordered.  If you have labs (blood work) drawn today and your tests are completely normal, you will receive your results only by: Marland Kitchen MyChart Message (if you have MyChart) OR . A paper copy in the mail If you have any lab test that is abnormal or we need to change your treatment, we will call you to review the results.   Testing/Procedures: None ordered.    Follow-Up: At [facility], you and your health needs are our priority.  As part of our continuing mission to provide you with exceptional heart care, we have created designated Provider Care Teams.  These Care Teams include your primary Cardiologist (physician) and Advanced Practice Providers (APPs -  Physician Assistants and Nurse Practitioners) who all work together to provide you with the care you need, when you need it.  We recommend signing up for the patient portal called "MyChart".  Sign up information is provided on this After Visit Summary.  MyChart is used to connect with patients for Virtual Visits (Telemedicine).  Patients are able to view lab/test results, encounter notes, upcoming appointments, etc.  Non-urgent messages can be sent to your provider as well.   To learn more about what you can do with MyChart, go to [email or web address].    Your next appointment:   You will see Dr Caryl Comes as needed.

## 2020-03-30 DIAGNOSIS — I129 Hypertensive chronic kidney disease with stage 1 through stage 4 chronic kidney disease, or unspecified chronic kidney disease: Secondary | ICD-10-CM | POA: Diagnosis not present

## 2020-03-30 DIAGNOSIS — I5042 Chronic combined systolic (congestive) and diastolic (congestive) heart failure: Secondary | ICD-10-CM | POA: Diagnosis not present

## 2020-06-17 DIAGNOSIS — Z9989 Dependence on other enabling machines and devices: Secondary | ICD-10-CM | POA: Diagnosis not present

## 2020-06-17 DIAGNOSIS — G4733 Obstructive sleep apnea (adult) (pediatric): Secondary | ICD-10-CM | POA: Diagnosis not present

## 2020-07-02 DIAGNOSIS — G4733 Obstructive sleep apnea (adult) (pediatric): Secondary | ICD-10-CM | POA: Diagnosis not present

## 2020-07-24 ENCOUNTER — Other Ambulatory Visit: Payer: Self-pay | Admitting: Cardiovascular Disease

## 2020-08-24 ENCOUNTER — Ambulatory Visit: Payer: PPO | Admitting: Cardiovascular Disease

## 2020-08-24 ENCOUNTER — Other Ambulatory Visit: Payer: Self-pay

## 2020-08-24 ENCOUNTER — Encounter: Payer: Self-pay | Admitting: Cardiovascular Disease

## 2020-08-24 DIAGNOSIS — G4733 Obstructive sleep apnea (adult) (pediatric): Secondary | ICD-10-CM | POA: Diagnosis not present

## 2020-08-24 DIAGNOSIS — E785 Hyperlipidemia, unspecified: Secondary | ICD-10-CM

## 2020-08-24 DIAGNOSIS — I447 Left bundle-branch block, unspecified: Secondary | ICD-10-CM

## 2020-08-24 DIAGNOSIS — I255 Ischemic cardiomyopathy: Secondary | ICD-10-CM

## 2020-08-24 DIAGNOSIS — I10 Essential (primary) hypertension: Secondary | ICD-10-CM | POA: Diagnosis not present

## 2020-08-24 DIAGNOSIS — I7121 Aneurysm of the ascending aorta, without rupture: Secondary | ICD-10-CM

## 2020-08-24 DIAGNOSIS — I712 Thoracic aortic aneurysm, without rupture: Secondary | ICD-10-CM | POA: Diagnosis not present

## 2020-08-24 NOTE — Progress Notes (Signed)
Patient ID: Trevor Schmidt, male   DOB: 02-Nov-1940, 80 y.o.   MRN: [id number]     Primary: [name], FNP  HPI:  Trevor Schmidt is a 80 year old white male who established care with me in September 2016.  I last saw him in January 2022.  He presents for a 28-monthfollow-up evaluation.  Mr BLadnierhas a history of hypertension, and previous diagnosis of mitral valve prolapse.  Remotely, he had seen [name]  In 2001 he had undergone a cardiac catheterization and was not told of having significant coronary blockage.  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  [place]  He had seen [name]  He has a history of obstructive sleep apnea and has been on CPAP therapy.  He received a new machine in February 2016 after his old machine malfunction.  A review of records from the [facility] reveals that there is also a history of remote PAF, hyperlipidemia, essential hypertension.  He denies any chest pain.  He is unaware of any recent cardiac arrhythmia.  He is using CPAP with 100% compliance.  He also has a history of hyperlipidemia and has been on atorvastatin.    When I initially saw him, I scheduled him for an echo Doppler study and nuclear stress test.  The echo Doppler study showed an ejection fraction of 30% with mild LVH and severe hypokinesis of the septal wall and inferior walls.  There was septal lateral dyssynergy.  There was grade 1 diastolic dysfunction.  There was no evidence for aortic valve stenosis, but there was mild aortic insufficiency.  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  He had mitral annular calcification with trivial MR, mild left atrial dilatation, and mild right atrial dilatation, he underwent a nuclear perfusion study which revealed concordant data with an ejection fraction of 34%.  He has underlying left bundle branch block.  A reversible septal and inferior  perfusion defect was demonstrated.    He underwent cardiac catheterization on 11/28/2014 which revealed CAD with a very short short left main, which immediately bifurcated into the LAD and left circumflex vessel.  The LAD had 30% proximal stenosis immediately after the first diagonal branch.  The circumflex vessel was a large caliber vessel, and there was extensive collateralization to the distal RCA branches.  The RCA immediately gave rise to a conus branch and was tortuous.  There was diffuse 50-60% stenoses proximal and medially beyond the acute margin prior to a large PDA vessel.  The distal RCA after second small inferior LV branch was totally occluded.  There were extensive collateralization to the distal RCA via the left injection.  He was felt that his inferior ischemia seen on the nuclear study was due to his distal RCA occlusion.  Medical therapy was recommended.  In June 2017 a f/u echo Doppler study showed an EF of 35% with Gr I DD.   His aortic root was mildly dioated at 40 mm.  Was aortic valve sclerosis without stenosis.  There was diffuse hypokinesis with septal lateral dyssynergy.  He had laboratory done by [name] February 2019 which showed a BUN of 11 creatinine 0.9.  LFTs were normal.  He continues to be on atorvastatin for hyperlipidemia and recent lipid studies were excellent with a total cholesterol 104, triglycerides 89, LDL 43.  He continues to use CPAP with 100% compliance and has  a ResMed AirSense 10 CPAP machine. His DME company is [employer].  He has remote history of bladder cancer and is followed by Dr. Karsten Ro.    A follow-up echo Doppler study in September 04, 2017  demonstrated severe global LV dysfunction with an EF of 20 to 25%, moderate LVH, grade 1 diastolic dysfunction, mild aortic insufficiency, mild dilation of his a sending aorta, mitral annular calcification with mild MR, and moderate left atrial enlargement.  His EF had decreased since his last evaluation.  He noted  shortness of breath with exertion.  He can walk at a slow pace up to a mile and a half without shortness of breath. He had recent laboratory done by his primary physician and his potassium was low at 3.4.  He was on 20 mill equivalents of potassium twice a day in addition to doxazosin 2 mg, olmesartan 40 mg, amlodipine 10 mg.   When I saw him on October 05, 2017, I recommended transition to [facility] and discontinued olmesartan.  I also recommended that he reduce his KCl to 20 mEq daily.  He saw [name], Pharm.D. on October 31, 2017.  He was tolerating his medication well without dizziness or chest pain.  His potassium was still 3.3 checked the day before and he was advised to resume the 20 mg once twice a day of potassium supplementation.  Since his blood pressure was 112/62 [name] was not further titrated.  When I saw him on November 30, 2017 he was doing well on Entresto 49/51 mg twice a day in place of olmesartan.  BP during that evaluation was stable and I added low-dose spironolactone 12.5 mg for aldosterone blockade.  Follow-up bmet on December 14, 2017 showed creatinine of 1.12 and potassium 4.2.  I saw him on January 11, 2018.  At that time, his blood pressure was 122/78.  I recommended he discontinue doxazosin and further titrated Entresto to 97/103 mg bid.  He has felt improved on maximal therapy.  He underwent a follow-up echo Doppler study on April 11, 2018 which shows slight improvement in ejection fraction now at 25-30.  His left atrium was moderately dilated.  There was mild aortic sclerosis without stenosis.  There was mild dilation of his ascending aorta.  Repeat laboratory in [name] office on March 12, 2018 showed stable renal function with a creatinine of 1.11.  Potassium was 4.4.   I saw him in March 2020.  He remained stable and denied any symptoms of CHF.  An echo Doppler study on April 11, 2018 continue to show an EF of 25 to 30% with mild concentric LVH and grade 1  diastolic dysfunction.  There was mitral annular calcification and aortic sclerosis with mild AR.  At the time he denied chest pain PND orthopnea or symptoms of CHF.  He was continuing to use CPAP with 100% compliance.   When I saw him in September 2020 he continued to be stable.  He was walking at least 1-1/2 miles per day and denied any change in exercise tolerance. He underwent complete set of laboratory at Dr. Pennie Banter office on September 13, 2018 which remained stable.  Hemoglobin hematocrit were 12.7/37.8.  Leukos was minimally increased at 105.  Renal function was stable with a creatinine of 1.19 and he had normal LFTs.  Lipid studies were excellent with total cholesterol 108, HDL 37, LDL 41 and triglycerides 150.  TSH was normal at 3.17.    I saw him in September 2021 at which time  he continued to do well.  He was cutting grass and denied any shortness of breath with activity.  He was continuing to walk at least 1/2 mile per day and was unaware of any presyncope or syncope. He was evaluated by Roby Lofts in June 2021 and with his reduced EF in the 30 to 35% range which had improved with [name] she discussed the possibility of EP evaluation for consideration of prophylactic ICD.  During my evaluation, I also had further discussion concerning possible prophylactic ICD placement.  At the time he was still hesitant to pursuing this option.  His blood pressure was mildly orthostatic during that evaluation I reduced his amlodipine from 10 mg down to 5 mg.  He was to continue his carvedilol 6.25 mg twice a day, Entresto 97/103 mg twice a day in addition to spironolactone 12.5 mg.  He continued to be on atorvastatin 40 mg with an LDL in August 2021 at 41.  He continued to use CPAP and denied residual daytime sleepiness.  Last saw him in January 2022 at which time he continued to feel well.   He has given further thought and presents to the office today stating that he has decided to be evaluated for  prophylactic ICD in light of his reduced LV function.  He was unaware of any palpitations.    March 06, 2020, Mr.Blevinss was evaluated by Dr. Jolyn Nap for consideration of possible ICD candidacy.  With his age of 64, EF at the borderline of 30 to 35% and with his fairly borderline class II symptoms it was felt that he may not require ICD therapy.  Dr. Caryl Comes reviewed with him to recent [place] trial which failed to show benefit and potentially raise a single apartment patients over 76 with nonischemic cardiomyopathy.  However, in the future with his left bundle branch block he may be a candidate for CRT pacing.  Presently, Mr. Lankford feels pretty well.  He denies any symptoms of chest pain or shortness of breath or palpitations.  He is on a regimen now consisting of Entresto 97/103 twice daily, carvedilol 6.25 mg twice a day, spironolactone 12.5 mg daily in addition to amlodipine 5 mg daily.  He continues to use CPAP with 100% compliance.  He had recently had a message on his CPAP machine since it is over 5 years that he may be approaching the end of his motor life.  As result he contacted [employer] his DME company and he is on a waiting list for a new ResMed CPAP machine with a wait list of approximately 4 to 6 months.  He presents for evaluation.   Past Medical History:  Diagnosis Date   Acute bronchitis 02/04/2015   Arthritis    Cancer ([place])    bladder   Confusion    Depression    Dysrhythmia    Edema    feet/ankles   GERD (gastroesophageal reflux disease)    HOH (hard of hearing)    aids   Hypertension    Kidney stones    MVP (mitral valve prolapse)    Prostate disease    Sleep apnea     Past Surgical History:  Procedure Laterality Date   BACK SURGERY     BLADDER SURGERY     cancer   CARDIAC CATHETERIZATION N/A 12/09/2014   Procedure: Left Heart Cath and Coronary Angiography;  Surgeon: Troy Sine, MD;  Location: [place] [facility];  Service: Cardiovascular;  Laterality: N/A;    CATARACT EXTRACTION  W/PHACO Right 09/08/2014   Procedure: CATARACT EXTRACTION PHACO AND INTRAOCULAR LENS PLACEMENT (IOC);  Surgeon: Estill Cotta, MD;  Location: [facility];  Service: Ophthalmology;  Laterality: Right;  US:1:06.3 AP%:23.3 CDE:24.97 Fluid lot# [id number]   CEREBRAL ANEURYSM REPAIR     coil and shunt placement   CYSTOSCOPY     litho x 2   EYE SURGERY     KNEE ARTHROSCOPY     WRIST SURGERY     ctr    Allergies  Allergen Reactions   Buspirone Diarrhea   Ceftriaxone Rash    Current Outpatient Medications  Medication Sig Dispense Refill   amLODipine (NORVASC) 5 MG tablet Take 1 tablet (5 mg total) by mouth daily. 90 tablet 3   aspirin 81 MG tablet Take 81 mg by mouth daily.     atorvastatin (LIPITOR) 40 MG tablet Take 40 mg by mouth daily.     carvedilol (COREG) 6.25 MG tablet TAKE ONE TABLET BY MOUTH TWICE A DAY 180 tablet 2   citalopram (CELEXA) 40 MG tablet TAKE 1 TABLET (40 MG TOTAL) BY MOUTH DAILY. 30 tablet 0   ENTRESTO 97-103 MG TAKE ONE TABLET BY MOUTH TWICE A DAY 180 tablet 2   finasteride (PROSCAR) 5 MG tablet Take 5 mg by mouth daily.     magnesium oxide (MAG-OX) 400 MG tablet Take 400 mg by mouth daily.     Multiple Vitamins-Minerals (OCUVITE-LUTEIN PO) 2 capsule     Potassium Chloride ER 20 MEQ TBCR Taking 1 tablet daiy 180 tablet 3   spironolactone (ALDACTONE) 25 MG tablet TAKE 1/2 TABLET BY MOUTH DAILY 45 tablet 2   vitamin B-12 (CYANOCOBALAMIN) 500 MCG tablet Take by mouth.     zolpidem (AMBIEN) 5 MG tablet Take 1 tablet by mouth at bedtime.     No current facility-administered medications for this visit.    Social History   Socioeconomic History   Marital status: Married    Spouse name: Not on file   Number of children: 4   Years of education: 12   Highest education level: Not on file  Occupational History   Occupation: Retired  Tobacco Use   Smoking status: Former   Smokeless tobacco: Former    Types: Snuff    Quit date: 2022    Tobacco comments:    Chews [place]  Substance and Sexual Activity   Alcohol use: No   Drug use: No   Sexual activity: Not on file  Other Topics Concern   Not on file  Social History Narrative   Fun: Fish, woodworking   Social Determinants of Radio broadcast assistant Strain: Not on file  Food Insecurity: Not on file  Transportation Needs: Not on file  Physical Activity: Not on file  Stress: Not on file  Social Connections: Not on file  Intimate Partner Violence: Not on file   Additional social history is notable that he is married for 43 years.  His 4 children, 9 grandchildren, and 2 great-grandchildren.  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  He completed 12 grades years of education.  He had smoked for 30 years but quit tobacco in 2003.  There is no EtOH use.    Family History  Problem Relation Age of Onset   Heart disease Mother    Prostate cancer Father    Healthy Maternal Grandmother    Healthy Maternal Grandfather    Healthy Paternal Grandmother    Healthy Paternal Grandfather  ROS General: Negative; No fevers, chills, or night sweats HEENT: Negative; No changes in vision or hearing, sinus congestion, difficulty swallowing Pulmonary: Negative; No cough, wheezing, shortness of breath, hemoptysis Cardiovascular:  See HPI;  GI: Negative; No nausea, vomiting, diarrhea, or abdominal pain GU: Remote history of bladder tumor. Musculoskeletal: Negative; no myalgias, joint pain, or weakness Hematologic/Oncologic: Negative; no easy bruising, bleeding Endocrine: Negative; no heat/cold intolerance; no diabetes Neuro: Negative; no changes in balance, headaches Skin: Negative; No rashes or skin lesions Psychiatric: Negative; No behavioral problems, depression Sleep:  Positive for sleep apnea on CPAP ; positive for difficulty with sleep initiation Other comprehensive 14 point system review is negative   Physical Exam BP 120/60 (BP  Location: Left Arm, Patient Position: Sitting, Cuff Size: Normal)   Pulse (!) 56   Ht '5\' 9"'  (1.753 m)   Wt 203 lb 6.4 oz (92.3 kg)   SpO2 98%   BMI 30.04 kg/m    Repeat blood pressure by me was 120/70  Wt Readings from Last 3 Encounters:  07/07/16 196 lb (88.9 kg)  01/05/16 202 lb (91.6 kg)  07/13/15 202 lb 6 oz (91.8 kg)   General: Alert, oriented, no distress.  Skin: normal turgor, no rashes, warm and dry HEENT: Normocephalic, atraumatic. Pupils equal round and reactive to light; sclera anicteric; extraocular muscles intact;  Nose without nasal septal hypertrophy Mouth/Parynx benign; Mallinpatti scale 3 Neck: No JVD, no carotid bruits; normal carotid upstroke Lungs: clear to ausculatation and percussion; no wheezing or rales Chest wall: without tenderness to palpitation; pectus excavatum Heart: PMI not displaced, RRR, s1 s2 normal, 1/6 systolic murmur, no diastolic murmur, no rubs, gallops, thrills, or heaves Abdomen: soft, nontender; no hepatosplenomehaly, BS+; abdominal aorta nontender and not dilated by palpation. Back: no CVA tenderness Pulses 2+ Musculoskeletal: full range of motion, normal strength, no joint deformities Extremities: no clubbing cyanosis or edema, Homan's sign negative  Neurologic: grossly nonfocal; Cranial nerves grossly wnl Psychologic: Normal mood and affect   August 24, 2020 ECG (independently read by me): Sinus bradycardia 56 bpm, left axis deviation, left bundle branch block.  QTc interval 465 ms.  February 26, 2020 ECG (independently read by me): Sinus bradycardia at 57; LBBB, QRS  168 msec; QTc 467 msec  May 2022 ECG (independently read by me): Sinus bradycardia at 54 bpm, first-degree AV block, left bundle branch block  September 2020 ECG (independently read by me): Bradycardia 57 bpm, first-degree degree AV block with PR interval 214 ms; left bundle branch block.  March 2020 ECG (independently read by me): Sinus bradycardia 51 bpm.  Left bundle  branch block.  First-degree AV block with appeared in with 210 ms.  January 11, 2018 ECG (independently read by me): Sinus bradycardia 52 bpm.  First-degree AV block with appeared normal at 220 ms.  Left bundle branch block.  November 30, 2017 ECG (independently read by me): Sinus bradycardia 57 bpm.  Left bundle branch block with repolarization changes.  Right superior axis.  October 05, 2017 ECG (independently read by me): Sinus bradycardia 53 bpm.  First-degree AV block.  Left bundle branch block with repolarization changes.  July 2019 ECG (independently read by me): Sinus bradycardia 52 bpm with first-degree AV block.  Left bundle branch block.  June 2018 ECG (independently read by me): Sinus bradycardia 53 bpm with first-degree AV block.  PR interval 210 ms.  Left bundle branch block with repolarization changes.  Left axis deviation.  December 2017 ECG (independently read by me): sinus bradycardia  53 bpm with left bundle branch block.  First degree AV block.  December 2016 ECG (independently read by me):  Sinus bradycardia at 56 bpm with first-degree AV block with a PR interval at 216.  Left bundle branch block with repolarization changes.  10/14/2014 ECG (independently read by me): Sinus bradycardia 52 bpm per first-degree AV block with a PR interval of 212 ms.  Left bundle branch block with repolarization changes.  LABS:  BMP Latest Ref Rng & Units 02/26/2020 06/23/2019 06/22/2019  Glucose 65 - 99 mg/dL 75 120(H) 128(H)  BUN 8 - 27 mg/dL '17 22 20  ' Creatinine 0.76 - 1.27 mg/dL 1.04 1.10 0.90  BUN/Creat Ratio 10 - 24 16 - -  Sodium 134 - 144 mmol/L 142 140 138  Potassium 3.5 - 5.2 mmol/L 4.8 4.4 3.9  Chloride 96 - 106 mmol/L 105 107 102  CO2 20 - 29 mmol/L 25 27 -  Calcium 8.6 - 10.2 mg/dL 9.5 8.6(L) -     Hepatic Function Latest Ref Rng & Units 02/26/2020 06/22/2019 01/05/2016  Total Protein 6.0 - 8.5 g/dL 6.8 7.1 6.4  Albumin 3.7 - 4.7 g/dL 4.4 4.0 3.9  AST 0 - 40 IU/L '19 20 17   ' ALT 0 - 44 IU/L '21 21 16  ' Alk Phosphatase 44 - 121 IU/L 96 90 93  Total Bilirubin 0.0 - 1.2 mg/dL 0.8 0.8 0.9    CBC Latest Ref Rng & Units 06/22/2019 06/22/2019 01/05/2016  WBC 4.0 - 10.5 K/uL - 10.0 5.9  Hemoglobin 13.0 - 17.0 g/dL 12.9(L) 13.6 13.3  Hematocrit 39.0 - 52.0 % 38.0(L) 41.2 40.0  Platelets 150 - 400 K/uL - 180 144   Lab Results  Component Value Date   MCV 91.8 06/22/2019   MCV 89.3 01/05/2016   MCV 87.1 01/19/2015   Lab Results  Component Value Date   TSH 2.79 01/05/2016   Lab Results  Component Value Date   HGBA1C 5.8 (H) 06/22/2019     BNP No results found for: BNP  ProBNP    Component Value Date/Time   PROBNP 470 01/29/2018 1001     Lipid Panel     Component Value Date/Time   CHOL 96 06/22/2019 2334   TRIG 60 06/22/2019 2334   HDL 37 (L) 06/22/2019 2334   CHOLHDL 2.6 06/22/2019 2334   VLDL 12 06/22/2019 2334   LDLCALC 47 06/22/2019 2334    RADIOLOGY: No results found.  IMPRESSION:  1. 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 LBBB (left bundle branch block)   3. Essential hypertension   4. OSA (obstructive sleep apnea) on CPAP   5. Hyperlipidemia LDL goal <70   6. Ascending aortic aneurysm [facility])      ASSESSMENT AND PLAN: Mr. Belmar is a 80 year-old Caucasian male has a history of hypertension, CAD, chronic left bundle branch block, hyperlipidemia, obstructive sleep apnea on CPAP therapy, remote brain aneurysm with subsequent VP shunt for hydrocephalus.  He was  found to have reduced LV function with lateral dyssynergy most likely contributed by his underlying left bundle branch block.  A follow-up echo Doppler study in August 2018 showed further reduction in LV function  with an EF of 20 to 25%.  There was moderate LVH with grade 1 diastolic dysfunction.  He had mild AR, mild dilation of his ascending aorta, mitral annular calcification with mild MR and moderate left atrial enlargement.  He was  ultimately started on Entresto with titration up  to maximum dose over several office visits.    His most recent echo Doppler study in May 2021 showed an EF of 30 to 35% with grade 1 diastolic dysfunction, mild MR and aneurysm of his ascending aorta measuring 45 mm.  He had moderate left atrial dilatation.  On exam he is euvolemic.  He is on guideline directed medical therapy and is now on Entresto at maximum dose 97/1 3 mg twice a day, carvedilol 6.25 mg twice a day, and spironolactone 12.5 mg daily.  Blood pressure today is stable and in addition he is on amlodipine 5 mg.  I reviewed his evaluation with Dr. Caryl Comes.  Presently it is felt that he would do well with continued medical therapy and based on recent data decision was made not to pursue prophylactic ICD.  On exam he is euvolemic.  He denies any significant shortness of breath and has class I-II symptoms at most.  He has chronic left bundle branch block which is stable.  He continues to be on atorvastatin for hyperlipidemia.  LDL cholesterol was 42 in August 2021.  He will be undergoing repeat laboratory in several weeks with Dr. Deland Pretty for his yearly physical.  He continues to use CPAP with 100% compliance.  His Epworth scale was calculated at office today and this endorsed at 1 arguing against residual daytime sleepiness.  His machine is now alerting him a signal that it will be approaching the end of his motor life.  He is now on a waiting list to [employer] to receive a new ResMed air sense 11 CPAP machine and he was initially told by them it may take 6 months.  To new current therapy.  I will see him in 6 months for reevaluation or sooner as needed.    Troy Sine, MD, [facility] 08/24/2020 8:39 AM

## 2020-08-24 NOTE — Patient Instructions (Signed)
Medication Instructions:  Your physician recommends that you continue on your current medications as directed. Please refer to the Current Medication list given to you today.  *If you need a refill on your cardiac medications before your next appointment, please call your pharmacy*   Lab Work: None ordered.    Testing/Procedures: None ordered.    Follow-Up: At [facility], you and your health needs are our priority.  As part of our continuing mission to provide you with exceptional heart care, we have created designated Provider Care Teams.  These Care Teams include your primary Cardiologist (physician) and Advanced Practice Providers (APPs -  Physician Assistants and Nurse Practitioners) who all work together to provide you with the care you need, when you need it.  We recommend signing up for the patient portal called "MyChart".  Sign up information is provided on this After Visit Summary.  MyChart is used to connect with patients for Virtual Visits (Telemedicine).  Patients are able to view lab/test results, encounter notes, upcoming appointments, etc.  Non-urgent messages can be sent to your provider as well.   To learn more about what you can do with MyChart, go to [email or web address].    Your next appointment:   6 month(s)  The format for your next appointment:   In Person  Provider:   Thomas Kelly, MD     

## 2020-08-31 ENCOUNTER — Other Ambulatory Visit: Payer: Self-pay | Admitting: Cardiovascular Disease

## 2020-09-24 DIAGNOSIS — G4733 Obstructive sleep apnea (adult) (pediatric): Secondary | ICD-10-CM | POA: Diagnosis not present

## 2020-09-29 DIAGNOSIS — K219 Gastro-esophageal reflux disease without esophagitis: Secondary | ICD-10-CM | POA: Diagnosis not present

## 2020-09-29 DIAGNOSIS — E785 Hyperlipidemia, unspecified: Secondary | ICD-10-CM | POA: Diagnosis not present

## 2020-10-02 DIAGNOSIS — I5042 Chronic combined systolic (congestive) and diastolic (congestive) heart failure: Secondary | ICD-10-CM | POA: Diagnosis not present

## 2020-10-02 DIAGNOSIS — K219 Gastro-esophageal reflux disease without esophagitis: Secondary | ICD-10-CM | POA: Diagnosis not present

## 2020-10-02 DIAGNOSIS — I10 Essential (primary) hypertension: Secondary | ICD-10-CM | POA: Diagnosis not present

## 2020-10-02 DIAGNOSIS — I2510 Atherosclerotic heart disease of native coronary artery without angina pectoris: Secondary | ICD-10-CM | POA: Diagnosis not present

## 2020-10-02 DIAGNOSIS — E7800 Pure hypercholesterolemia, unspecified: Secondary | ICD-10-CM | POA: Diagnosis not present

## 2020-10-02 DIAGNOSIS — D692 Other nonthrombocytopenic purpura: Secondary | ICD-10-CM | POA: Diagnosis not present

## 2020-10-02 DIAGNOSIS — F339 Major depressive disorder, recurrent, unspecified: Secondary | ICD-10-CM | POA: Diagnosis not present

## 2020-10-02 DIAGNOSIS — Z0000 Encounter for general adult medical examination without abnormal findings: Secondary | ICD-10-CM | POA: Diagnosis not present

## 2020-10-02 DIAGNOSIS — R413 Other amnesia: Secondary | ICD-10-CM | POA: Diagnosis not present

## 2020-10-02 DIAGNOSIS — E538 Deficiency of other specified B group vitamins: Secondary | ICD-10-CM | POA: Diagnosis not present

## 2020-10-02 DIAGNOSIS — E785 Hyperlipidemia, unspecified: Secondary | ICD-10-CM | POA: Diagnosis not present

## 2020-10-04 IMAGING — DX DG CHEST 1V PORT
2 series · 2 of 2 positions shown · non-contrast
Comparison: Radiograph 12/02/2014

CLINICAL DATA: Chest pain

EXAM:
PORTABLE CHEST 1 VIEW

[chest ap (1 of 2)]
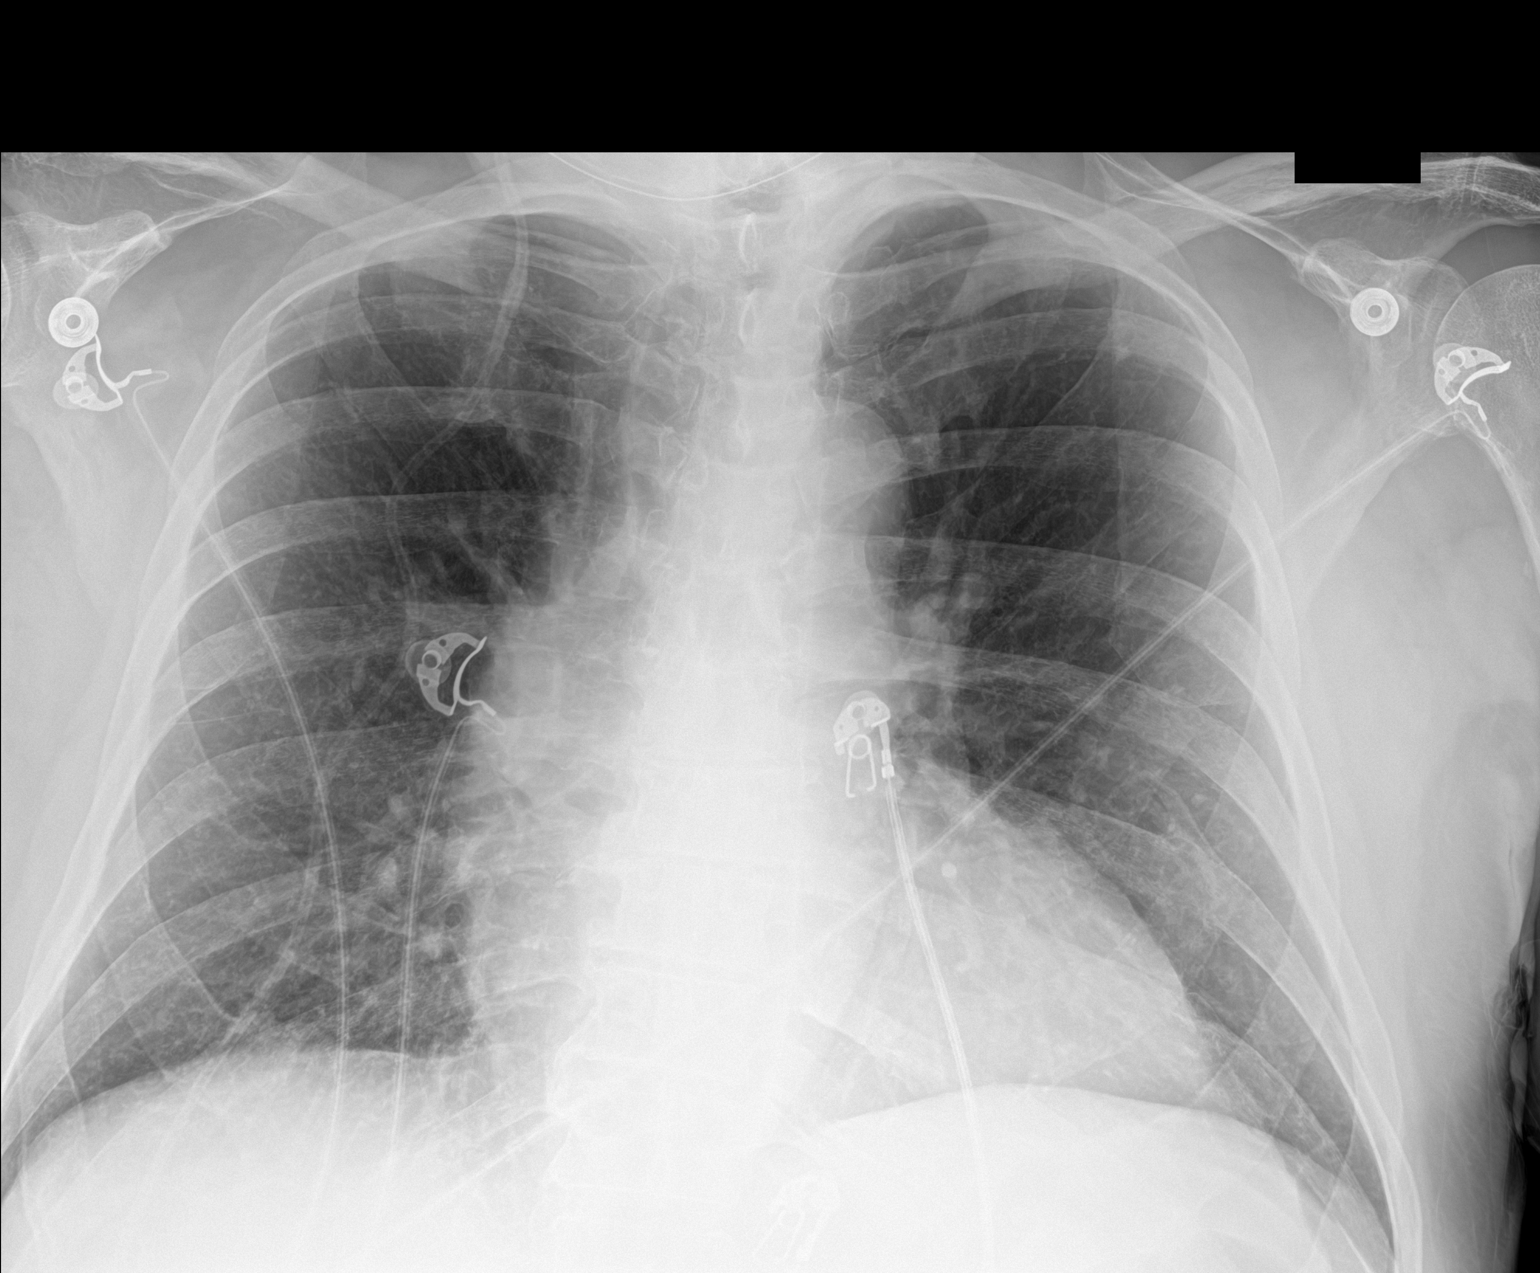

[chest ap (2 of 2)]
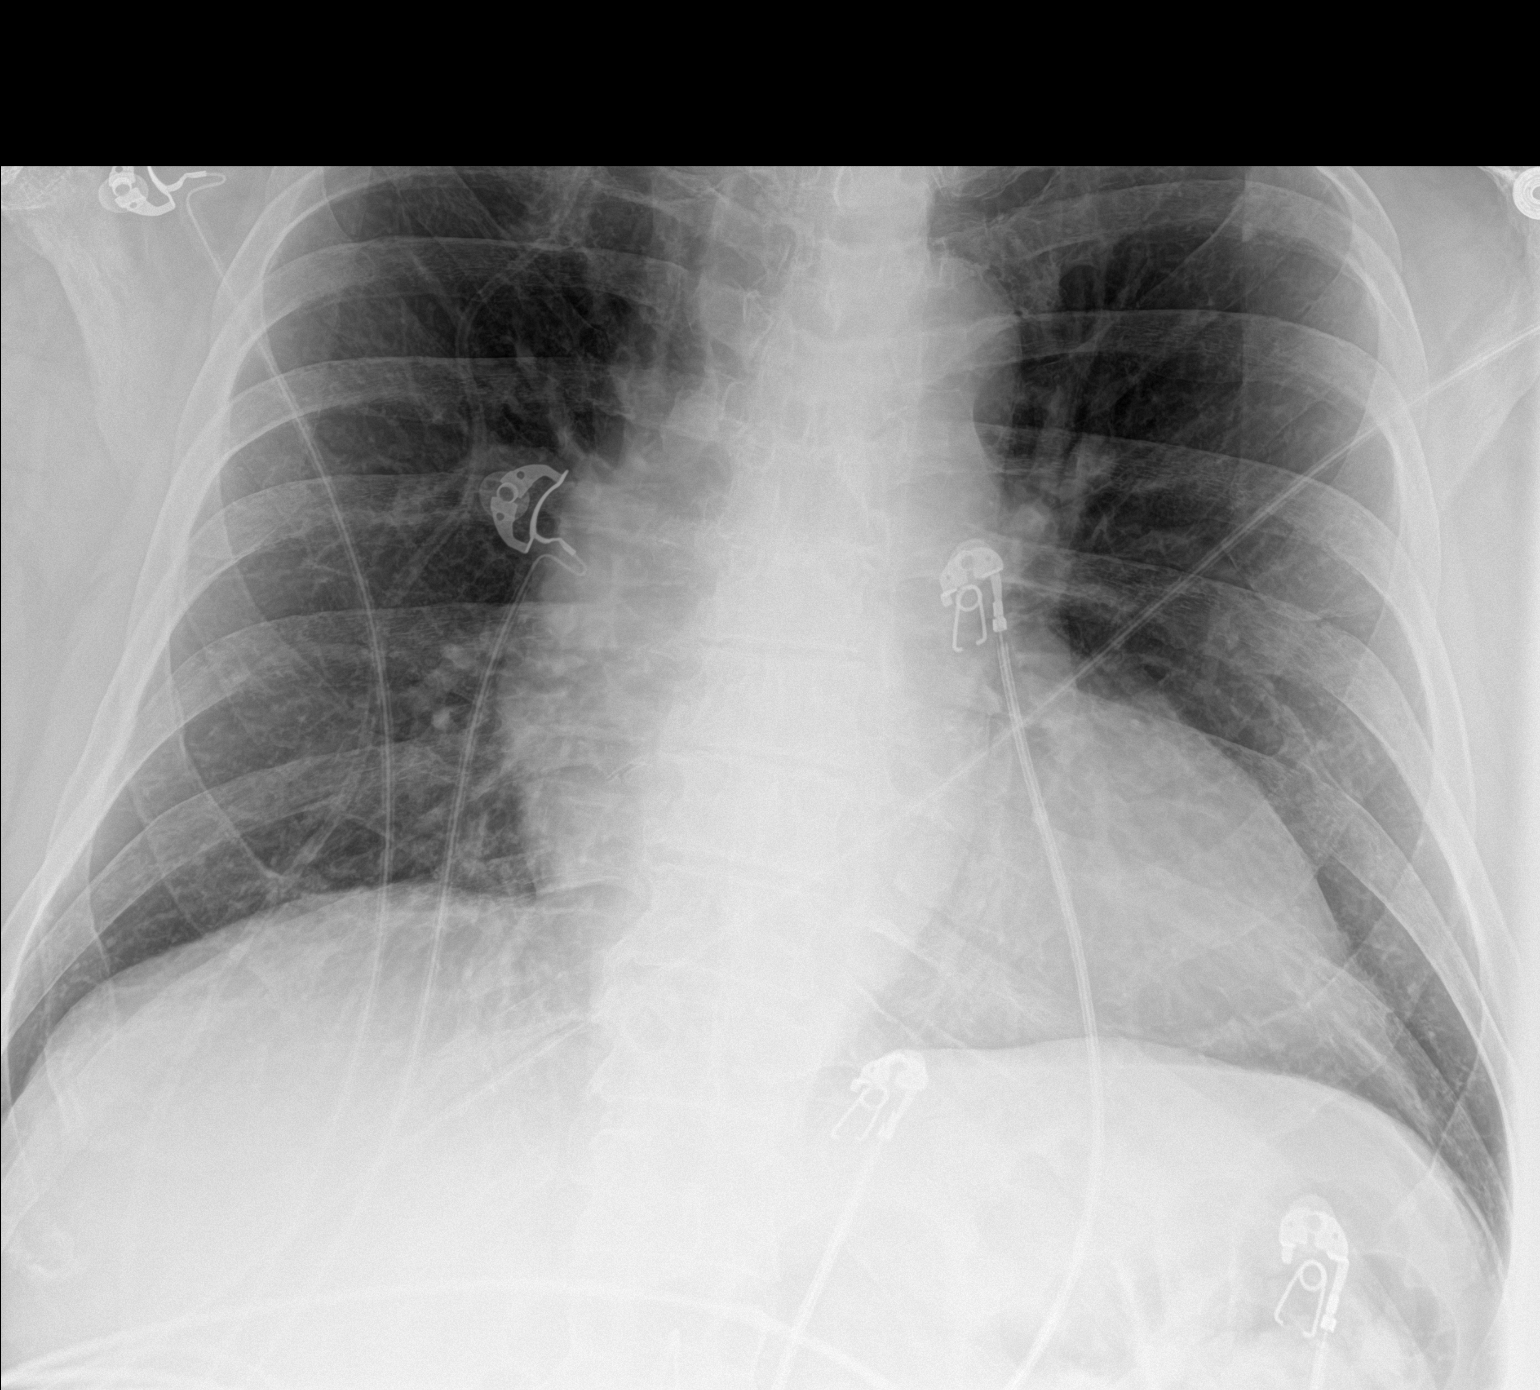

[2 of 2 positions shown; findings below may reference images not displayed]

FINDINGS: Streaky basilar opacities without focal consolidation, pneumothorax
or effusion. Pulmonary vascularity remains fairly well-defined.
Stable borderline cardiomegaly. The cardiomediastinal contours are
otherwise unremarkable. No acute osseous or soft tissue abnormality.
Stable contiguous or right chest wall shunt catheter tubing without
discontinuity. Telemetry leads overlie the chest.
IMPRESSION: Streaky basilar opacities favored to represent atelectasis.

Intact right chest wall shunt catheter tubing.

## 2020-10-25 DIAGNOSIS — G4733 Obstructive sleep apnea (adult) (pediatric): Secondary | ICD-10-CM | POA: Diagnosis not present

## 2020-11-24 DIAGNOSIS — G4733 Obstructive sleep apnea (adult) (pediatric): Secondary | ICD-10-CM | POA: Diagnosis not present

## 2020-11-28 ENCOUNTER — Other Ambulatory Visit: Payer: Self-pay | Admitting: Cardiovascular Disease

## 2020-12-21 DIAGNOSIS — H353131 Nonexudative age-related macular degeneration, bilateral, early dry stage: Secondary | ICD-10-CM | POA: Diagnosis not present

## 2020-12-21 DIAGNOSIS — Z961 Presence of intraocular lens: Secondary | ICD-10-CM | POA: Diagnosis not present

## 2020-12-25 DIAGNOSIS — G4733 Obstructive sleep apnea (adult) (pediatric): Secondary | ICD-10-CM | POA: Diagnosis not present

## 2021-02-22 ENCOUNTER — Ambulatory Visit: Payer: PPO | Admitting: Cardiovascular Disease

## 2021-02-22 ENCOUNTER — Encounter: Payer: Self-pay | Admitting: Cardiovascular Disease

## 2021-02-22 ENCOUNTER — Other Ambulatory Visit: Payer: Self-pay

## 2021-02-22 VITALS — BP 112/64 | HR 55 | Ht 71.0 in | Wt 202.2 lb

## 2021-02-22 DIAGNOSIS — I7121 Aneurysm of the ascending aorta, without rupture: Secondary | ICD-10-CM

## 2021-02-22 DIAGNOSIS — I255 Ischemic cardiomyopathy: Secondary | ICD-10-CM

## 2021-02-22 DIAGNOSIS — E785 Hyperlipidemia, unspecified: Secondary | ICD-10-CM

## 2021-02-22 DIAGNOSIS — G4733 Obstructive sleep apnea (adult) (pediatric): Secondary | ICD-10-CM | POA: Diagnosis not present

## 2021-02-22 DIAGNOSIS — I447 Left bundle-branch block, unspecified: Secondary | ICD-10-CM

## 2021-02-22 DIAGNOSIS — I2510 Atherosclerotic heart disease of native coronary artery without angina pectoris: Secondary | ICD-10-CM

## 2021-02-22 DIAGNOSIS — I10 Essential (primary) hypertension: Secondary | ICD-10-CM

## 2021-02-22 NOTE — Patient Instructions (Addendum)
Medication Instructions:  NO CHANGES  *If you need a refill on your cardiac medications before your next appointment, please call your pharmacy*   Lab Work: NOT NEEDED   Testing/Procedures: NOT NEEDED   Follow-Up: At [facility], you and your health needs are our priority.  As part of our continuing mission to provide you with exceptional heart care, we have created designated Provider Care Teams.  These Care Teams include your primary Cardiologist (physician) and Advanced Practice Providers (APPs -  Physician Assistants and Nurse Practitioners) who all work together to provide you with the care you need, when you need it.     Your next appointment:   12 month(s)  ANNUAL AND SLEEP   The format for your next appointment:   In Person  Provider:   Shelva Majestic, MD

## 2021-02-22 NOTE — Progress Notes (Signed)
Patient ID: Trevor Schmidt, male   DOB: 04/02/1940, 81 y.o.   MRN: [id number]     Primary: Trevor Po, FNP  HPI:  Trevor Schmidt is a 81 year old white male who established care with me in September 2016.  I last saw him in July 2022.  He presents for a 6 month follow-up evaluation.  Trevor Schmidt has a history of hypertension, and previous diagnosis of mitral valve prolapse.  Remotely, he had seen Dr. Mare Schmidt.  In 2001 he had undergone a cardiac catheterization and was not told of having significant coronary blockage.  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  He recently moved back to the [place] area.  He had seen Dr. Ubaldo Schmidt.  He has a history of obstructive sleep apnea and has been on Schmidt therapy.  He received a new machine in February 2016 after his old machine malfunction.  A review of records from the Trevor Schmidt system reveals that there is also a history of remote PAF, hyperlipidemia, essential hypertension.  He denies any chest pain.  He is unaware of any recent cardiac arrhythmia.  He is using Schmidt with 100% compliance.  He also has a history of hyperlipidemia and has been on atorvastatin.    When I initially saw him, I scheduled him for an echo Doppler study and nuclear stress test.  The echo Doppler study showed an ejection fraction of 30% with mild LVH and severe hypokinesis of the septal wall and inferior walls.  There was septal lateral dyssynergy.  There was grade 1 diastolic dysfunction.  There was no evidence for aortic valve stenosis, but there was mild aortic insufficiency.  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  He had mitral annular calcification with trivial Trevor, mild left atrial dilatation, and mild right atrial dilatation, he underwent a nuclear perfusion study which revealed concordant data with an ejection fraction of 34%.  He has underlying left bundle branch block.  A reversible septal and inferior  perfusion defect was demonstrated.    He underwent cardiac catheterization on 11/28/2014 which revealed CAD with a very short short left main, which immediately bifurcated into the LAD and left circumflex vessel.  The LAD had 30% proximal stenosis immediately after the first diagonal branch.  The circumflex vessel was a large caliber vessel, and there was extensive collateralization to the distal RCA branches.  The RCA immediately gave rise to a conus branch and was tortuous.  There was diffuse 50-60% stenoses proximal and medially beyond the acute margin prior to a large PDA vessel.  The distal RCA after second small inferior LV branch was totally occluded.  There were extensive collateralization to the distal RCA via the left injection.  He was felt that his inferior ischemia seen on the nuclear study was due to his distal RCA occlusion.  Medical therapy was recommended.  In June 2017 a f/u echo Doppler study showed an EF of 35% with Gr I DD.   His aortic root was mildly dioated at 40 mm.  Was aortic valve sclerosis without stenosis.  There was diffuse hypokinesis with septal lateral dyssynergy.  He had laboratory done by Trevor Schmidt in February 2019 which showed a BUN of 11 creatinine 0.9.  LFTs were normal.  He continues to be on atorvastatin for hyperlipidemia and recent lipid studies were excellent with a total cholesterol 104, triglycerides 89, LDL 43.  He continues to use Schmidt with 100% compliance and  has a Trevor Schmidt machine. His DME company is Trevor Schmidt.  He has remote history of bladder cancer and is followed by Trevor Schmidt.    A follow-up echo Doppler study in September 04, 2017  demonstrated severe global LV dysfunction with an EF of 20 to 25%, moderate LVH, grade 1 diastolic dysfunction, mild aortic insufficiency, mild dilation of his a sending aorta, mitral annular calcification with mild Trevor, and moderate left atrial enlargement.  His EF had decreased since his last evaluation.  He noted  shortness of breath with exertion.  He can walk at a slow pace up to a mile and a half without shortness of breath. He had recent laboratory done by his primary physician and his potassium was low at 3.4.  He was on 20 mill equivalents of potassium twice a day in addition to doxazosin 2 mg, olmesartan 40 mg, amlodipine 10 mg.   When I saw him on October 05, 2017, I recommended transition to Trevor Schmidt and discontinued olmesartan.  I also recommended that he reduce his KCl to 20 mEq daily.  He saw Trevor Schmidt, Pharm.D. on October 31, 2017.  He was tolerating his medication well without dizziness or chest pain.  His potassium was still 3.3 checked the day before and he was advised to resume the 20 mg once twice a day of potassium supplementation.  Since his blood pressure was 112/62 Trevor Schmidt was not further titrated.  When I saw him on October 81, 2019 he was doing well on Entresto 49/51 mg twice a day in place of olmesartan.  BP during that evaluation was stable and I added low-dose spironolactone 12.5 mg for aldosterone blockade.  Follow-up bmet on December 14, 2017 showed creatinine of 1.12 and potassium 4.2.  I saw him on January 11, 2018.  At that time, his blood pressure was 81/78  I recommended he discontinue doxazosin and further titrated Entresto to 97/103 mg bid.  He has felt improved on maximal therapy.  He underwent a follow-up echo Doppler study on March 81, 2020 which shows slight improvement in ejection fraction now at 25-30.  His left atrium was moderately dilated.  There was mild aortic sclerosis without stenosis.  There was mild dilation of his ascending aorta.  Repeat laboratory in [name] office on March 12, 2018 showed stable renal function with a creatinine of 1.11.  Potassium was 4.4.   I saw him in March 2020.  He remained stable and denied any symptoms of CHF.  An echo Doppler study on April 11, 2018 continue to show an EF of 25 to 30% with mild concentric LVH and grade 1  diastolic dysfunction.  There was mitral annular calcification and aortic sclerosis with mild AR.  At the time he denied chest pain PND orthopnea or symptoms of CHF.  He was continuing to use Schmidt with 100% compliance.   When I saw him in September 2020 he continued to be stable.  He was walking at least 1-1/2 miles per day and denied any change in exercise tolerance. He underwent complete set of laboratory at Dr. Pennie Banter office on September 13, 2018 which remained stable.  Hemoglobin hematocrit were 12.7/37.8.  Leukos was minimally increased at 105.  Renal function was stable with a creatinine of 1.19 and he had normal LFTs.  Lipid studies were excellent with total cholesterol 108, HDL 37, LDL 41 and triglycerides 150.  TSH was normal at 3.17.    I saw him in September 2021 at which  time he continued to do well.  He was cutting grass and denied any shortness of breath with activity.  He was continuing to walk at least 1/2 mile per day and was unaware of any presyncope or syncope. He was evaluated by Roby Lofts in June 2021 and with his reduced EF in the 30 to 35% range which had improved with Trevor Schmidt she discussed the possibility of EP evaluation for consideration of prophylactic ICD.  During my evaluation, I also had further discussion concerning possible prophylactic ICD placement.  At the time he was still hesitant to pursuing this option.  His blood pressure was mildly orthostatic during that evaluation I reduced his amlodipine from 10 mg down to 5 mg.  He was to continue his carvedilol 6.25 mg twice a day, Entresto 97/103 mg twice a day in addition to spironolactone 12.5 mg.  He continued to be on atorvastatin 40 mg with an LDL in August 2021 at 5.  He continued to use Schmidt and denied residual daytime sleepiness.  At his visit in January 2022 he continued to feel well.   He has given further thought and presents to the office today stating that he has decided to be evaluated for prophylactic ICD in  light of his reduced LV function.  He was unaware of any palpitations.    On March 06, 2020, Trevor.Parr  was evaluated by Dr. Jolyn Nap for consideration of possible ICD candidacy.  With his age of 62, EF at the borderline of 30 to 35% and with his fairly borderline class II symptoms it was felt that he may not require ICD therapy.  Dr. Caryl Comes reviewed with him the recent [place] trial which failed to show benefit in patients over 68 with nonischemic cardiomyopathy.  However, in the future with his left bundle branch block he may be a candidate for CRT pacing.  I last saw him on August 24, 2020 at which time he continued to feel well and denied any symptoms of chest pain, shortness of breath, or palpitations.  He denied any presyncope or syncope.  He waso n a regimen now consisting of Entresto 97/103 twice daily, carvedilol 6.25 mg twice a day, spironolactone 12.5 mg daily in addition to amlodipine 5 mg daily.  He continues to use Schmidt with 100% compliance.  He had recently had a message on his Schmidt machine since it is over 5 years that he may be approaching the end of his motor life.  As result he contacted [employer] his DME company and he is on a waiting list for a new Trevor Schmidt machine with a wait list of approximately 4 to 6 months.    Since I last saw him, he has continued to feel well and denies any chest pain or palpitations.  He received a new Trevor air sense 11 AutoSet Schmidt unit on August 24, 2020.  This apparently has been set at a pressure range of 4 to 20 cm.  He was meeting compliance standards on his initial download from July 25 through September 22, 2020.  His 95th percentile pressure was 11.2 with maximum average pressure 13.8 cm.  AHI was 3.1/h.  His most recent download from January 21, 2021 through February 19, 2021 continues to show excellent compliance with average use of 10 hours and 36 minutes per night.  AHI is 2.2 and his 95th percentile pressure is 12.2 cm of water with maximum average  pressure 15.6.  He presents for follow-up evaluation.   Past Medical  History:  Diagnosis Date   Acute bronchitis 02/04/2015   Arthritis    Cancer ([place])    bladder   Confusion    Depression    Dysrhythmia    Edema    feet/ankles   GERD (gastroesophageal reflux disease)    HOH (hard of hearing)    aids   Hypertension    Kidney stones    MVP (mitral valve prolapse)    Prostate disease    Sleep apnea     Past Surgical History:  Procedure Laterality Date   BACK SURGERY     BLADDER SURGERY     cancer   CARDIAC CATHETERIZATION N/A 12/09/2014   Procedure: Left Heart Cath and Coronary Angiography;  Surgeon: Troy Sine, MD;  Location: [facility];  Service: Cardiovascular;  Laterality: N/A;   CATARACT EXTRACTION W/PHACO Right 09/08/2014   Procedure: CATARACT EXTRACTION PHACO AND INTRAOCULAR LENS PLACEMENT (IOC);  Surgeon: Estill Cotta, MD;  Location: [facility];  Service: Ophthalmology;  Laterality: Right;  US:1:06.3 AP%:23.3 CDE:24.97 Fluid lot# [id number]   CEREBRAL ANEURYSM REPAIR     coil and shunt placement   CYSTOSCOPY     litho x 2   EYE SURGERY     KNEE ARTHROSCOPY     WRIST SURGERY     ctr    Allergies  Allergen Reactions   Buspirone Diarrhea   Ceftriaxone Rash    Current Outpatient Medications  Medication Sig Dispense Refill   amLODipine (NORVASC) 5 MG tablet TAKE 1 TABLET BY MOUTH DAILY 90 tablet 3   aspirin 81 MG tablet Take 81 mg by mouth daily.     atorvastatin (LIPITOR) 40 MG tablet Take 40 mg by mouth daily.     carvedilol (COREG) 6.25 MG tablet TAKE ONE TABLET BY MOUTH TWICE A DAY 180 tablet 2   citalopram (CELEXA) 40 MG tablet TAKE 1 TABLET (40 MG TOTAL) BY MOUTH DAILY. 30 tablet 0   ENTRESTO 97-103 MG TAKE ONE TABLET BY MOUTH TWICE A DAY 180 tablet 2   finasteride (PROSCAR) 5 MG tablet Take 5 mg by mouth daily.     FLUZONE HIGH-DOSE QUADRIVALENT 0.7 ML SUSY      magnesium oxide (MAG-OX) 400 MG tablet Take 400 mg by mouth daily.      Multiple Vitamins-Minerals (OCUVITE-LUTEIN Schmidt) 2 capsule     PFIZER COVID-19 VAC BIVALENT injection      Potassium Chloride ER 20 MEQ TBCR Taking 1 tablet daiy 180 tablet 3   spironolactone (ALDACTONE) 25 MG tablet TAKE 1/2 TABLET BY MOUTH DAILY 45 tablet 2   vitamin B-12 (CYANOCOBALAMIN) 500 MCG tablet Take by mouth.     zolpidem (AMBIEN) 5 MG tablet Take 1 tablet by mouth at bedtime.     No current facility-administered medications for this visit.    Social History   Socioeconomic History   Marital status: Married    Spouse name: Not on file   Number of children: 4   Years of education: 12   Highest education level: Not on file  Occupational History   Occupation: Retired  Tobacco Use   Smoking status: Former   Smokeless tobacco: Former    Types: Snuff    Quit date: 2022   Tobacco comments:    Chews [place]  Substance and Sexual Activity   Alcohol use: No   Drug use: No   Sexual activity: Not on file  Other Topics Concern   Not on file  Social History Narrative  Fun: Fish, woodworking   Social Determinants of Radio broadcast assistant Strain: Not on file  Food Insecurity: Not on file  Transportation Needs: Not on file  Physical Activity: Not on file  Stress: Not on file  Social Connections: Not on file  Intimate Partner Violence: Not on file   Additional social history is notable that he is married for 43 years.  His 4 children, 9 grandchildren, and 2 great-grandchildren.  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  He completed 12 grades years of education.  He had smoked for 30 years but quit tobacco in 2003.  There is no EtOH use.    Family History  Problem Relation Age of Onset   Heart disease Mother    Prostate cancer Father    Healthy Maternal Grandmother    Healthy Maternal Grandfather    Healthy Paternal Grandmother    Healthy Paternal Grandfather     ROS General: Negative; No fevers, chills, or night sweats HEENT:  Negative; No changes in vision or hearing, sinus congestion, difficulty swallowing Pulmonary: Negative; No cough, wheezing, shortness of breath, hemoptysis Cardiovascular:  See HPI;  GI: Negative; No nausea, vomiting, diarrhea, or abdominal pain GU: Remote history of bladder tumor. Musculoskeletal: Negative; no myalgias, joint pain, or weakness Hematologic/Oncologic: Negative; no easy bruising, bleeding Endocrine: Negative; no heat/cold intolerance; no diabetes Neuro: Negative; no changes in balance, headaches Skin: Negative; No rashes or skin lesions Psychiatric: Negative; No behavioral problems, depression Sleep:  Positive for sleep apnea on Schmidt ; positive for difficulty with sleep initiation Other comprehensive 14 point system review is negative   Physical Exam BP 112/64    Pulse (!) 55    Ht 5' 11" (1.803 m)    Wt 202 lb 3.2 oz (91.7 kg)    SpO2 98%    BMI 28.20 kg/m   Repeat blood pressure by me was 112/64 supine and 104/64 standing  Wt Readings from Last 3 Encounters:  07/07/16 196 lb (88.9 kg)  01/05/16 202 lb (91.6 kg)  07/13/15 202 lb 6 oz (91.8 kg)   General: Alert, oriented, no distress.  Skin: normal turgor, no rashes, warm and dry HEENT: Normocephalic, atraumatic. Pupils equal round and reactive to light; sclera anicteric; extraocular muscles intact; Nose without nasal septal hypertrophy Mouth/Parynx benign; Mallinpatti scale 3 Neck: No JVD, no carotid bruits; normal carotid upstroke Lungs: clear to ausculatation and percussion; no wheezing or rales Chest wall: without tenderness to palpitation Heart: PMI not displaced, RRR, s1 s2 normal, 1/6 systolic murmur, no diastolic murmur, no rubs, gallops, thrills, or heaves Abdomen: soft, nontender; no hepatosplenomehaly, BS+; abdominal aorta nontender and not dilated by palpation. Back: no CVA tenderness Pulses 2+ Musculoskeletal: full range of motion, normal strength, no joint deformities Extremities: no clubbing  cyanosis or edema, Homan's sign negative  Neurologic: grossly nonfocal; Cranial nerves grossly wnl Psychologic: Normal mood and affect     February 22, 2021 ECG (independently read by me):  Sinus bradycardia at 55, LBBB  August 24, 2020 ECG (independently read by me): Sinus bradycardia 56 bpm, left axis deviation, left bundle branch block.  QTc interval 465 ms.  February 26, 2020 ECG (independently read by me): Sinus bradycardia at 57; LBBB, QRS  168 msec; QTc 467 msec  May 2022 ECG (independently read by me): Sinus bradycardia at 54 bpm, first-degree AV block, left bundle branch block  September 2020 ECG (independently read by me): Bradycardia 57 bpm, first-degree degree AV block with  PR interval 214 ms; left bundle branch block.  March 2020 ECG (independently read by me): Sinus bradycardia 51 bpm.  Left bundle branch block.  First-degree AV block with appeared in with 210 ms.  January 11, 2018 ECG (independently read by me): Sinus bradycardia 52 bpm.  First-degree AV block with appeared normal at 220 ms.  Left bundle branch block.  November 30, 2017 ECG (independently read by me): Sinus bradycardia 57 bpm.  Left bundle branch block with repolarization changes.  Right superior axis.  October 05, 2017 ECG (independently read by me): Sinus bradycardia 53 bpm.  First-degree AV block.  Left bundle branch block with repolarization changes.  July 2019 ECG (independently read by me): Sinus bradycardia 52 bpm with first-degree AV block.  Left bundle branch block.  June 2018 ECG (independently read by me): Sinus bradycardia 53 bpm with first-degree AV block.  PR interval 210 ms.  Left bundle branch block with repolarization changes.  Left axis deviation.  December 2017 ECG (independently read by me): sinus bradycardia 53 bpm with left bundle branch block.  First degree AV block.  December 2016 ECG (independently read by me):  Sinus bradycardia at 56 bpm with first-degree AV block with a PR  interval at 216.  Left bundle branch block with repolarization changes.  10/14/2014 ECG (independently read by me): Sinus bradycardia 52 bpm per first-degree AV block with a PR interval of 212 ms.  Left bundle branch block with repolarization changes.  LABS:  BMP Latest Ref Rng & Units 02/26/2020 06/23/2019 06/22/2019  Glucose 65 - 99 mg/dL 75 120(H) 128(H)  BUN 8 - 27 mg/dL _0 Creatinine 0.76 - 1.27 mg/dL 1.04 1.10 0.90  BUN/Creat Ratio 10 - 24 16 - -  Sodium 134 - 144 mmol/L 142 140 138  Potassium 3.5 - 5.2 mmol/L 4.8 4.4 3.9  Chloride 96 - 106 mmol/L 105 107 102  CO2 20 - 29 mmol/L 25 27 -  Calcium 8.6 - 10.2 mg/dL 9.5 8.6(L) -     Hepatic Function Latest Ref Rng & Units 02/26/2020 06/22/2019 01/05/2016  Total Protein 6.0 - 8.5 g/dL 6.8 7.1 6.4  Albumin 3.7 - 4.7 g/dL 4.4 4.0 3.9  AST 0 - 40 IU/L _1 ALT 0 - 44 IU/L _2 Alk Phosphatase 44 - 121 IU/L 96 90 93  Total Bilirubin 0.0 - 1.2 mg/dL 0.8 0.8 0.9    CBC Latest Ref Rng & Units 06/22/2019 06/22/2019 01/05/2016  WBC 4.0 - 10.5 K/uL - 10.0 5.9  Hemoglobin 13.0 - 17.0 g/dL 12.9(L) 13.6 13.3  Hematocrit 39.0 - 52.0 % 38.0(L) 41.2 40.0  Platelets 150 - 400 K/uL - 180 144   Lab Results  Component Value Date   MCV 91.8 06/22/2019   MCV 89.3 01/05/2016   MCV 87.1 01/19/2015   Lab Results  Component Value Date   TSH 2.79 01/05/2016   Lab Results  Component Value Date   HGBA1C 5.8 (H) 06/22/2019     BNP No results found for: BNP  ProBNP    Component Value Date/Time   PROBNP 470 01/29/2018 1001     Lipid Panel     Component Value Date/Time   CHOL 96 06/22/2019 2334   TRIG 60 06/22/2019 2334   HDL 37 (L) 06/22/2019 2334   CHOLHDL 2.6 06/22/2019 2334   VLDL 12 06/22/2019 2334   LDLCALC 47 06/22/2019 2334    RADIOLOGY: No results found.  IMPRESSION:  1. 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 LBBB (left bundle branch block)   3. CAD in native artery    4. OSA (obstructive sleep apnea)   5. Essential hypertension   6. Hyperlipidemia LDL goal <70   7. Aneurysm of ascending aorta without rupture      ASSESSMENT AND PLAN: Trevor. Gladu is an 81 year-old male has a history of hypertension, CAD, chronic left bundle branch block, hyperlipidemia, obstructive sleep apnea on Schmidt therapy, remote brain aneurysm with subsequent VP shunt for hydrocephalus.  He was  found to have reduced LV function with lateral dyssynergy most likely contributed by his underlying left bundle branch block.  A follow-up echo Doppler study in August 2018 showed further reduction in LV function  with an EF of 20 to 25%.  There was moderate LVH with grade 1 diastolic dysfunction.  He had mild AR, mild dilation of his ascending aorta, mitral annular calcification with mild Trevor and moderate left atrial enlargement.  He was started on Entresto with titration up to maximum dose over several office visits.    His most recent echo Doppler study in May 2021 showed an EF of 30 to 35% with grade 1 diastolic dysfunction, mild Trevor and aneurysm of his ascending aorta measuring 45 mm.  He had moderate left atrial dilatation.  Presently, Trevor. Passage remains asymptomatic on his guideline directed medical regimen now consisting of amlodipine 5 mg, carvedilol 6.25 mg twice a day, Entresto 97/103 mg twice a day, in addition to spironolactone 12.5 mg daily.  He continues to be on atorvastatin 40 mg for hyperlipidemia.  LDL cholesterol has been excellent at 43.  He has chronic left bundle branch block which is stable.  He continues to use Schmidt with 100% compliance and received a new Trevor air sense 11 Schmidt auto unit on August 24, 2020 with [employer] as his DME company.  Compliance is excellent.  On his most recent download, 95th percentile pressure is 12.2 with maximum average pressure 5.6.  As result, I am changing his pressure range from a 4 - 20 cm of water to 7 - 18 centimeters of water.  His AHI is excellent.   He denies any residual daytime sleepiness and Epworth Sleepiness Scale score calculated in the office today endorsed at 0.  As long as he is stable, I will see him in 1 year for reevaluation or sooner as needed.   Troy Sine, MD, [facility] 03/01/2021 6:15 PM

## 2021-03-01 ENCOUNTER — Encounter: Payer: Self-pay | Admitting: Cardiovascular Disease

## 2021-04-22 ENCOUNTER — Other Ambulatory Visit: Payer: Self-pay | Admitting: Cardiovascular Disease

## 2021-04-28 ENCOUNTER — Other Ambulatory Visit: Payer: Self-pay | Admitting: Cardiovascular Disease

## 2021-05-18 ENCOUNTER — Other Ambulatory Visit: Payer: Self-pay | Admitting: Cardiovascular Disease

## 2021-06-30 NOTE — Progress Notes (Unsigned)
    Subjective:    CC: L hip and L leg pain  I, Laurynn Mccorvey, LAT, ATC, am serving as scribe for Dr. Clementeen Graham.  HPI: Pt is an 81 y/o male presenting w/ c/o L hip and L leg pain.  He locates his pain to .  Radiating pain: L LE paresthesias: Aggravating factors: Treatments tried:   Pertinent review of Systems: ***  Relevant historical information: ***   Objective:   There were no vitals filed for this visit. General: Well Developed, well nourished, and in no acute distress.   MSK: ***  Lab and Radiology Results No results found for this or any previous visit (from the past 72 hour(s)). No results found.    Impression and Recommendations:    Assessment and Plan: 81 y.o. male with ***.  PDMP not reviewed this encounter. No orders of the defined types were placed in this encounter.  No orders of the defined types were placed in this encounter.   Discussed warning signs or symptoms. Please see discharge instructions. Patient expresses understanding.   ***

## 2021-07-01 ENCOUNTER — Ambulatory Visit: Payer: PPO | Admitting: Family Medicine

## 2021-07-01 ENCOUNTER — Encounter: Payer: Self-pay | Admitting: Family Medicine

## 2021-07-01 VITALS — BP 122/78 | HR 56 | Ht 71.0 in | Wt 205.4 lb

## 2021-07-01 DIAGNOSIS — M25552 Pain in left hip: Secondary | ICD-10-CM | POA: Diagnosis not present

## 2021-07-01 DIAGNOSIS — M1612 Unilateral primary osteoarthritis, left hip: Secondary | ICD-10-CM | POA: Diagnosis not present

## 2021-07-01 DIAGNOSIS — M79652 Pain in left thigh: Secondary | ICD-10-CM

## 2021-07-01 DIAGNOSIS — M169 Osteoarthritis of hip, unspecified: Secondary | ICD-10-CM | POA: Insufficient documentation

## 2021-07-01 NOTE — Patient Instructions (Addendum)
Nice to meet you today.  I've referred you to Orthopedics for a surgical consultation.  Their office will call you to schedule but please let us know if you don't hear from them in one week regarding scheduling.  Follow-up: as needed

## 2021-07-01 NOTE — Progress Notes (Signed)
    Subjective:    CC: L hip and L leg pain  I, Molly Weber, LAT, ATC, am serving as scribe for Dr. Lynne Leader.  HPI: Pt is an 81 y/o male presenting w/ c/o L hip and L leg pain that has been worsening over the last month-6 weeks.  He locates his pain to his L glute and L ant thigh .  Pain is primarily located anterior thigh.  Low back pain: yes, has arthritis per pt Radiating pain: yes into his L ant thigh L LE paresthesias: no Aggravating factors: walking downhill; walking; transitioning from sit-to-stand Treatments tried: Advil;   Diagnostic testing: L hip XR- 06/30/21  Pertinent review of Systems: No fevers or chills  Relevant historical information: Controlled heart failure EF about 35% when last checked 2021.   Objective:    Vitals:   07/01/21 0855  BP: 122/78  Pulse: (!) 56  SpO2: 95%    General: Well Developed, well nourished, and in no acute distress.   MSK: Left hip: Normal appearing Nontender. Decreased hip range of motion to internal rotation and flexion with pain. Strength reduced abduction and external rotation.  Lab and Radiology Results  X-ray images left hip obtained at PCP office May 31 vitamin patient on a CD personally independently interpreted today. Significant left hip DJD.  No acute fractures.   Impression and Recommendations:    Assessment and Plan: 81 y.o. male with left hip pain thought to be due to DJD.  He has significant DJD on x-ray per my read today.  This is almost certainly the source of his pain.  Based on his long-lasting worsening pain limited mobility and advancing age I do not think that conservative management is going to help very much.  I think a hip replacement is probably his best option.  Plan to refer directly to orthopedic surgery for consultation total hip replacement.  I am happy to perform a diagnostic and maybe therapeutic hip injection if orthopedic surgery thinks it is necessary.  However if its not necessary I  do not think it is going to be very helpful from a therapeutic standpoint.  Recheck back with me as needed.Marland Kitchen  PDMP not reviewed this encounter. Orders Placed This Encounter  Procedures   Ambulatory referral to Orthopedic Surgery    Referral Priority:   Routine    Referral Type:   Surgical    Referral Reason:   Specialty Services Required    Requested Specialty:   Orthopedic Surgery    Number of Visits Requested:   1   No orders of the defined types were placed in this encounter.   Discussed warning signs or symptoms. Please see discharge instructions. Patient expresses understanding.   The above documentation has been reviewed and is accurate and complete Lynne Leader, M.D.

## 2021-07-09 ENCOUNTER — Ambulatory Visit (INDEPENDENT_AMBULATORY_CARE_PROVIDER_SITE_OTHER): Payer: PPO

## 2021-07-09 ENCOUNTER — Ambulatory Visit: Payer: PPO | Admitting: Orthopaedic Surgery

## 2021-07-09 VITALS — Ht 71.0 in | Wt 202.2 lb

## 2021-07-09 DIAGNOSIS — M1612 Unilateral primary osteoarthritis, left hip: Secondary | ICD-10-CM

## 2021-07-09 NOTE — Progress Notes (Signed)
Office Visit Note   Patient: Trevor Schmidt           Date of Birth: 19-Sep-1940           MRN: [id number] Visit Date: 07/09/2021              Requested by: Trevor Hams, MD [place],  [place] [address] PCP: Trevor Pretty, MD   Assessment & Plan: Visit Diagnoses:  1. Primary osteoarthritis of left hip     Plan: Impression is advanced left hip DJD with bone-on-bone joint space narrowing.  I went over the x-rays with the patient and his wife in detail.  Detailed discussion on treatment options.  Based on his options he has elected to move forward with a left total hip replacement pending preoperative cardiac clearance from Trevor Schmidt.  Details of the surgery including risk benefits prognosis reviewed with the patient.  Follow-Up Instructions: No follow-ups on file.   Orders:  Orders Placed This Encounter  Procedures   XR HIP UNILAT W OR W/O PELVIS 2-3 VIEWS LEFT   No orders of the defined types were placed in this encounter.     Procedures: No procedures performed   Clinical Data: No additional findings.   Subjective: Chief Complaint  Patient presents with   Left Hip - Pain    HPI Trevor Schmidt is a very pleasant 81 year old gentleman referral from Trevor Schmidt for chronic left hip pain and surgical consultation for total hip replacement.  He has had pain in the hip and groin for years and in the last few months it has gotten much worse.  He is experiencing giving way and fear of falling.  The pain is constant and Advil provides temporary relief.  He is having significant difficulty with ADLs.   Review of Systems  Constitutional: Negative.   All other systems reviewed and are negative.    Objective: Vital Signs: Ht '5\' 11"'$  (1.803 m)   Wt 202 lb 3.2 oz (91.7 kg)   BMI 28.20 kg/m   Physical Exam Vitals and nursing note reviewed.  Constitutional:      Appearance: He is well-developed.  HENT:     Head: Normocephalic and atraumatic.  Eyes:     Pupils:  Pupils are equal, round, and reactive to light.  Pulmonary:     Effort: Pulmonary effort is normal.  Abdominal:     Palpations: Abdomen is soft.  Musculoskeletal:        General: Normal range of motion.     Cervical back: Neck supple.  Skin:    General: Skin is warm.  Neurological:     Mental Status: He is alert and oriented to person, place, and time.  Psychiatric:        Behavior: Behavior normal.        Thought Content: Thought content normal.        Judgment: Judgment normal.     Ortho Exam Examination of the left hip shows pain with flexion and FADIR.  Positive Stinchfield sign.  Antalgic gait.  Lateral hip is nontender. Specialty Comments:  No specialty comments available.  Imaging: XR HIP UNILAT W OR W/O PELVIS 2-3 VIEWS LEFT  Result Date: 07/09/2021 Advanced degenerative joint disease with bone-on-bone joint space narrowing.    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   CAD in native artery    Hyperlipidemia LDL goal <70 11/28/2014   OSA on CPAP 11/28/2014   Hyperlipidemia 10/16/2014   LBBB (left bundle branch block) 10/16/2014   Mitral valve prolapse 10/07/2014   Essential hypertension 10/07/2014   BPH (benign prostatic hyperplasia) 10/07/2014   Past Medical History:  Diagnosis Date   Acute bronchitis 02/04/2015   Arthritis    Cancer (HCC)    bladder   Confusion    Depression    Dysrhythmia    Edema    feet/ankles   GERD (gastroesophageal reflux disease)    HOH (hard of hearing)    aids   Hypertension    Kidney stones    MVP (mitral valve prolapse)    Prostate disease    Sleep apnea     Family History  Problem Relation Age of Onset   Heart disease Mother    Prostate cancer Father    Healthy Maternal  Grandmother    Healthy Maternal Grandfather    Healthy Paternal Grandmother    Healthy Paternal Grandfather     Past Surgical History:  Procedure Laterality Date   BACK SURGERY     BLADDER SURGERY     cancer   CARDIAC CATHETERIZATION N/A 12/09/2014   Procedure: Left Heart Cath and Coronary Angiography;  Surgeon: Trevor Sine, MD;  Location: [facility];  Service: Cardiovascular;  Laterality: N/A;   CATARACT EXTRACTION W/PHACO Right 09/08/2014   Procedure: CATARACT EXTRACTION PHACO AND INTRAOCULAR LENS PLACEMENT (IOC);  Surgeon: Trevor Cotta, MD;  Location: [facility];  Service: Ophthalmology;  Laterality: Right;  US:1:06.3 AP%:23.3 CDE:24.97 Fluid lot# [id number]   CEREBRAL ANEURYSM REPAIR     coil and shunt placement   CYSTOSCOPY     litho x 2   EYE SURGERY     KNEE ARTHROSCOPY     WRIST SURGERY     ctr   Social History   Occupational History   Occupation: Retired  Tobacco Use   Smoking status: Former   Smokeless tobacco: Former    Types: Snuff    Quit date: 2022   Tobacco comments:    Chews [place]  Substance and Sexual Activity   Alcohol use: No   Drug use: No   Sexual activity: Not on file

## 2021-07-20 ENCOUNTER — Telehealth: Payer: Self-pay

## 2021-07-20 NOTE — Telephone Encounter (Signed)
    Name: Trevor Schmidt  DOB: 1940-07-11  MRN: [id number]  Primary Cardiologist: Shelva Majestic, MD   Preoperative team, please contact this patient and set up a phone call appointment for further preoperative risk assessment. Please obtain consent and complete medication review. Thank you for your help.  I confirm that guidance regarding antiplatelet and oral anticoagulation therapy has been completed and, if necessary, noted below.    Ledora Bottcher, PA 07/20/2021, 5:14 PM [place] [address] [place], [place] [address]

## 2021-07-20 NOTE — Telephone Encounter (Signed)
   Pre-operative Risk Assessment    Patient Name: Trevor Schmidt  DOB: January 13, 1941 MRN: [id number]      Request for Surgical Clearance    Procedure:   Left Total Hip Arthroplasty  Date of Surgery:  Clearance TBD                                 Surgeon:  Marianna Payment, MD Surgeon's Group or Practice Name:  [facility] Phone number:  [phone] Fax number:  [phone]   Type of Clearance Requested:   - Medical    Type of Anesthesia:  Spinal   Additional requests/questions:  Please advise surgeon/provider what medications should be held.  Signed, Elsie Lincoln Heidi Lemay   07/20/2021, 3:26 PM

## 2021-07-21 ENCOUNTER — Other Ambulatory Visit: Payer: Self-pay

## 2021-07-21 ENCOUNTER — Telehealth: Payer: Self-pay | Admitting: *Deleted

## 2021-07-21 NOTE — Telephone Encounter (Signed)
Patient's wife returning call. 

## 2021-07-21 NOTE — Telephone Encounter (Signed)
Pt has been scheduled for televisit, 07/22/21 11:40, medications reconciled / consent on file.    Patient Consent for Virtual Visit        Trevor Schmidt has provided verbal consent on 07/21/2021 for a virtual visit (video or telephone).   CONSENT FOR VIRTUAL VISIT FOR:  Trevor Schmidt  By participating in this virtual visit I agree to the following:  I hereby voluntarily request, consent and authorize [facility] and its employed or contracted physicians, Engineer, materials, nurse practitioners or other licensed health care professionals (the Practitioner), to provide me with telemedicine health care services (the "Services") as deemed necessary by the treating Practitioner. I acknowledge and consent to receive the Services by the Practitioner via telemedicine. I understand that the telemedicine visit will involve communicating with the Practitioner through live audiovisual communication technology and the disclosure of certain medical information by electronic transmission. I acknowledge that I have been given the opportunity to request an in-person assessment or other available alternative prior to the telemedicine visit and am voluntarily participating in the telemedicine visit.  I understand that I have the right to withhold or withdraw my consent to the use of telemedicine in the course of my care at any time, without affecting my right to future care or treatment, and that the Practitioner or I may terminate the telemedicine visit at any time. I understand that I have the right to inspect all information obtained and/or recorded in the course of the telemedicine visit and may receive copies of available information for a reasonable fee.  I understand that some of the potential risks of receiving the Services via telemedicine include:  Delay or interruption in medical evaluation due to technological equipment failure or disruption; Information transmitted may not be sufficient (e.g. poor  resolution of images) to allow for appropriate medical decision making by the Practitioner; and/or  In rare instances, security protocols could fail, causing a breach of personal health information.  Furthermore, I acknowledge that it is my responsibility to provide information about my medical history, conditions and care that is complete and accurate to the best of my ability. I acknowledge that Practitioner's advice, recommendations, and/or decision may be based on factors not within their control, such as incomplete or inaccurate data provided by me or distortions of diagnostic images or specimens that may result from electronic transmissions. I understand that the practice of medicine is not an exact science and that Practitioner makes no warranties or guarantees regarding treatment outcomes. I acknowledge that a copy of this consent can be made available to me via my patient portal ([name]), or I can request a printed copy by calling the office of [facility].    I understand that my insurance will be billed for this visit.   I have read or had this consent read to me. I understand the contents of this consent, which adequately explains the benefits and risks of the Services being provided via telemedicine.  I have been provided ample opportunity to ask questions regarding this consent and the Services and have had my questions answered to my satisfaction. I give my informed consent for the services to be provided through the use of telemedicine in my medical care

## 2021-07-21 NOTE — Telephone Encounter (Signed)
Pt has been scheduled for a televisit, 07/22/21, 11;40, clearance will be addressed at that time.

## 2021-07-21 NOTE — Telephone Encounter (Signed)
1st attempt to reach pt regarding surgical clearance and the need for a tele visit, left a message for pt to call back and ask for the preop team.  

## 2021-07-22 ENCOUNTER — Ambulatory Visit (INDEPENDENT_AMBULATORY_CARE_PROVIDER_SITE_OTHER): Payer: PPO | Admitting: Physician Assistant

## 2021-07-22 DIAGNOSIS — Z01810 Encounter for preprocedural cardiovascular examination: Secondary | ICD-10-CM

## 2021-07-22 NOTE — Progress Notes (Signed)
Virtual Visit via Telephone Note   Because of Trevor Schmidt's co-morbid illnesses, he is at least at moderate risk for complications without adequate follow up.  This format is felt to be most appropriate for this patient at this time.  The patient did not have access to video technology/had technical difficulties with video requiring transitioning to audio format only (telephone).  All issues noted in this document were discussed and addressed.  No physical exam could be performed with this format.  Please refer to the patient's chart for his consent to telehealth for Trevor Schmidt.  Evaluation Performed:  Preoperative cardiovascular risk assessment _____________   Date:  07/22/2021   Patient ID:  Trevor Schmidt, DOB 12/21/1940, MRN [id number] Patient Location:  Home Provider location:   Office  Primary Care Provider:  Deland Pretty, MD Primary Cardiologist:  Shelva Majestic, MD  Chief Complaint / Patient Profile   81 y.o. y/o male with a h/o HTN, brain aneurysm with VP shunt, OSA, CHF, CAD, and LBBB  who is pending left total hip arthroplasty and presents today for telephonic preoperative cardiovascular risk assessment.  Past Medical History    Past Medical History:  Diagnosis Date   Acute bronchitis 02/04/2015   Arthritis    Cancer ([facility])    bladder   Confusion    Depression    Dysrhythmia    Edema    feet/ankles   GERD (gastroesophageal reflux disease)    HOH (hard of hearing)    aids   Hypertension    Kidney stones    MVP (mitral valve prolapse)    Prostate disease    Sleep apnea    Past Surgical History:  Procedure Laterality Date   BACK SURGERY     BLADDER SURGERY     cancer   CARDIAC CATHETERIZATION N/A 12/09/2014   Procedure: Left Heart Cath and Coronary Angiography;  Surgeon: Troy Sine, MD;  Location: [facility];  Service: Cardiovascular;  Laterality: N/A;   CATARACT EXTRACTION W/PHACO Right 09/08/2014   Procedure: CATARACT EXTRACTION PHACO AND  INTRAOCULAR LENS PLACEMENT (IOC);  Surgeon: Estill Cotta, MD;  Location: [facility];  Service: Ophthalmology;  Laterality: Right;  US:1:06.3 AP%:23.3 CDE:24.97 Fluid lot# [id number]   CEREBRAL ANEURYSM REPAIR     coil and shunt placement   CYSTOSCOPY     litho x 2   EYE SURGERY     KNEE ARTHROSCOPY     WRIST SURGERY     ctr    Allergies  Allergies  Allergen Reactions   Buspirone Diarrhea   Ceftriaxone Rash    History of Present Illness    Trevor Schmidt is a 81 y.o. male who presents via Engineer, civil (consulting) for a telehealth visit today.  Pt was last seen in cardiology clinic on 1/23/3 by Dr. Claiborne Billings.  At that time Trevor Schmidt was doing well.  The patient is now pending procedure as outlined above. Since his last visit, he has been mainly limited by hip pain. Two months ago, he was walking 1.5 miles several times each week. Currently he is no longer walking that distance, but can still complete 4.0 METS without chest pain (moderate house work, floors, grocery shopping) without chest pain.   Home Medications    Prior to Admission medications   Medication Sig Start Date End Date Taking? Authorizing Provider  amLODipine (NORVASC) 5 MG tablet TAKE 1 TABLET BY MOUTH DAILY 11/30/20   Troy Sine, MD  aspirin 81 MG tablet  Take 81 mg by mouth daily.    [provider]  atorvastatin (LIPITOR) 40 MG tablet Take 40 mg by mouth daily.    [provider]  Calcium Carbonate (CALCIUM 600 PO) Take 600 mg by mouth in the morning and at bedtime.    [provider]  carvedilol (COREG) 6.25 MG tablet TAKE ONE TABLET BY MOUTH TWICE A DAY 08/31/20   Troy Sine, MD  citalopram (CELEXA) 40 MG tablet TAKE 1 TABLET (40 MG TOTAL) BY MOUTH DAILY. 03/12/15   Golden Circle, FNP  ENTRESTO 97-103 MG TAKE ONE TABLET BY MOUTH TWICE A DAY 04/22/21   Troy Sine, MD  finasteride (PROSCAR) 5 MG tablet Take 5 mg by mouth daily.    [provider]  FLUZONE  HIGH-DOSE QUADRIVALENT 0.7 ML SUSY  11/15/20   [provider]  magnesium oxide (MAG-OX) 400 MG tablet Take 400 mg by mouth daily.    [provider]  Multiple Vitamins-Minerals (OCUVITE-LUTEIN PO) 2 capsule    [provider]  PFIZER COVID-19 VAC BIVALENT injection  12/12/20   [provider]  Potassium Chloride ER 20 MEQ TBCR TAKE 1 TABLET BY MOUTH DAILY 05/19/21   Troy Sine, MD  spironolactone (ALDACTONE) 25 MG tablet Take 0.5 tablets (12.5 mg total) by mouth daily. 04/29/21   Troy Sine, MD  vitamin B-12 (CYANOCOBALAMIN) 500 MCG tablet Take by mouth.    [provider]  zolpidem (AMBIEN) 5 MG tablet Take 1 tablet by mouth at bedtime. 07/15/20   [provider]    Physical Exam    Vital Signs:  Trevor Schmidt does not have vital signs available for review today.  Given telephonic nature of communication, physical exam is limited. AAOx3. NAD. Normal affect.  Speech and respirations are unlabored.  Accessory Clinical Findings    None  Assessment & Plan    1.  Preoperative Cardiovascular Risk Assessment:  He has a history of CAD and CHF. He does not require insulin and sCr is below 2. He is able to achieve 4.0 METS without angina. He understands he has a 6.6% risk of MACE during the perioperative period. He denies angina, exertional symptoms, and signs and symptoms of heart failure. I do not think additional testing would mitigate his risk. He is optimized from a cardiac perspective. Given his heart disease, would recommend continuing ASA if possible, will defer to surgeon.   Therefore, based on ACC/AHA guidelines, the patient would be at acceptable risk for the planned procedure without further cardiovascular testing.   The patient was advised that if he develops new symptoms prior to surgery to contact our office to arrange for a follow-up visit, and he verbalized understanding.    A copy of this note will be routed to  requesting surgeon.  Time:   Today, I have spent 10 minutes with the patient with telehealth technology discussing medical history, symptoms, and management plan.     Tami Lin Abhay Godbolt, PA  07/22/2021, 11:23 AM

## 2021-08-17 ENCOUNTER — Other Ambulatory Visit: Payer: Self-pay | Admitting: Cardiovascular Disease

## 2021-08-20 NOTE — Progress Notes (Signed)
Surgical Instructions    Your procedure is scheduled on Monday July 31st.  Report to [facility] Main Entrance "A" at 5:30 A.M., then check in with the Admitting office.  Call this number if you have problems the morning of surgery:  [phone]   If you have any questions prior to your surgery date call [phone]: Open Monday-Friday 8am-4pm    Remember:  Do not eat after midnight the night before your surgery  You may drink clear liquids until 4:15am the morning of your surgery.   Clear liquids allowed are: Water, Non-Citrus Juices (without pulp), Carbonated Beverages, Clear Tea, Black Coffee ONLY (NO MILK, CREAM OR POWDERED CREAMER of any kind), and Gatorade   Enhanced Recovery after Surgery for Orthopedics Enhanced Recovery after Surgery is a protocol used to improve the stress on your body and your recovery after surgery.  Patient Instructions  The day of surgery (if you do NOT have diabetes):  Drink ONE (1) Pre-Surgery Clear Ensure by __4:15___ am the morning of surgery   This drink was given to you during your hospital  pre-op appointment visit. Nothing else to drink after completing the  Pre-Surgery Clear Ensure.          If you have questions, please contact your surgeon's office.     Take these medicines the morning of surgery with A SIP OF WATER: amLODipine (NORVASC) 5 MG tablet carvedilol (COREG) 6.25 MG tablet citalopram (CELEXA) 40 MG tablet finasteride (PROSCAR) 5 MG tablet Polyethyl Glycol-Propyl Glycol (SYSTANE OP) Potassium Chloride ER 20 MEQ TBCR  Follow your surgeon's instructions on when to stop Aspirin.  If no instructions were given by your surgeon then you will need to call the office to get those instructions.     As of today, STOP taking any Aspirin (unless otherwise instructed by your surgeon) Aleve, Naproxen, Ibuprofen, Motrin, Advil, Goody's, BC's, all herbal medications, fish oil, and all vitamins.           Do not wear jewelry or  makeup Do not wear lotions, powders, colognes, or deodorant. Do not shave 48 hours prior to surgery.  Men may shave face and neck. Do not bring valuables to the hospital. Do not wear nail polish   [facility] is not responsible for any belongings or valuables. .   Do NOT Smoke (Tobacco/Vaping)  24 hours prior to your procedure  If you use a CPAP at night, you may bring your mask for your overnight stay.   Contacts, glasses, hearing aids, dentures or partials may not be worn into surgery, please bring cases for these belongings   For patients admitted to the hospital, discharge time will be determined by your treatment team.   Patients discharged the day of surgery will not be allowed to drive home, and someone needs to stay with them for 24 hours.   SURGICAL WAITING ROOM VISITATION Patients having surgery or a procedure may have no more than 2 support people in the waiting area - these visitors may rotate.   Children under the age of 64 must have an adult with them who is not the patient. If the patient needs to stay at the hospital during part of their recovery, the visitor guidelines for inpatient rooms apply. Pre-op nurse will coordinate an appropriate time for 1 support person to accompany patient in pre-op.  This support person may not rotate.   Please refer to the [facility] website for the visitor guidelines for Inpatients (after your surgery is over and you  are in a regular room).    Special instructions:    Oral Hygiene is also important to reduce your risk of infection.  Remember - BRUSH YOUR TEETH THE MORNING OF SURGERY WITH YOUR REGULAR TOOTHPASTE   [place]- Preparing For Surgery  Before surgery, you can play an important role. Because skin is not sterile, your skin needs to be as free of germs as possible. You can reduce the number of germs on your skin by washing with CHG (chlorahexidine gluconate) Soap before surgery.  CHG is an antiseptic cleaner which kills  germs and bonds with the skin to continue killing germs even after washing.     Please do not use if you have an allergy to CHG or antibacterial soaps. If your skin becomes reddened/irritated stop using the CHG.  Do not shave (including legs and underarms) for at least 48 hours prior to first CHG shower. It is OK to shave your face.  Please follow these instructions carefully.     Shower the NIGHT BEFORE SURGERY and the MORNING OF SURGERY with CHG Soap.   If you chose to wash your hair, wash your hair first as usual with your normal shampoo. After you shampoo, rinse your hair and body thoroughly to remove the shampoo.  Then [employer] and genitals (private parts) with your normal soap and rinse thoroughly to remove soap.  After that Use CHG Soap as you would any other liquid soap. You can apply CHG directly to the skin and wash gently with a scrungie or a clean washcloth.   Apply the CHG Soap to your body ONLY FROM THE NECK DOWN.  Do not use on open wounds or open sores. Avoid contact with your eyes, ears, mouth and genitals (private parts). Wash Face and genitals (private parts)  with your normal soap.   Wash thoroughly, paying special attention to the area where your surgery will be performed.  Thoroughly rinse your body with warm water from the neck down.  DO NOT shower/wash with your normal soap after using and rinsing off the CHG Soap.  Pat yourself dry with a CLEAN TOWEL.  Wear CLEAN PAJAMAS to bed the night before surgery  Place CLEAN SHEETS on your bed the night before your surgery  DO NOT SLEEP WITH PETS.   Day of Surgery:  Take a shower with CHG soap. Wear Clean/Comfortable clothing the morning of surgery Do not apply any deodorants/lotions.   Remember to brush your teeth WITH YOUR REGULAR TOOTHPASTE.    If you received a COVID test during your pre-op visit, it is requested that you wear a mask when out in public, stay away from anyone that may not be feeling well,  and notify your surgeon if you develop symptoms. If you have been in contact with anyone that has tested positive in the last 10 days, please notify your surgeon.    Please read over the following fact sheets that you were given.

## 2021-08-23 ENCOUNTER — Encounter (HOSPITAL_COMMUNITY)
Admission: RE | Admit: 2021-08-23 | Discharge: 2021-08-23 | Disposition: A | Discharge status: Home / Self Care | Payer: PPO | Source: Ambulatory Visit | Attending: Orthopaedic Surgery | Admitting: Orthopaedic Surgery

## 2021-08-23 ENCOUNTER — Encounter (HOSPITAL_COMMUNITY): Payer: Self-pay

## 2021-08-23 ENCOUNTER — Other Ambulatory Visit: Payer: Self-pay | Admitting: Physician Assistant

## 2021-08-23 ENCOUNTER — Other Ambulatory Visit: Payer: Self-pay

## 2021-08-23 VITALS — BP 141/73 | HR 57 | Temp 97.7°F | Resp 17 | Ht 71.0 in | Wt 202.7 lb

## 2021-08-23 DIAGNOSIS — M1612 Unilateral primary osteoarthritis, left hip: Secondary | ICD-10-CM | POA: Insufficient documentation

## 2021-08-23 DIAGNOSIS — Z01818 Encounter for other preprocedural examination: Secondary | ICD-10-CM | POA: Diagnosis present

## 2021-08-23 LAB — COMPREHENSIVE METABOLIC PANEL
ALT: 24 U/L (ref 0–44)
AST: 17 U/L (ref 15–41)
Albumin: 3.8 g/dL (ref 3.5–5.0)
Alkaline Phosphatase: 99 U/L (ref 38–126)
Anion gap: 6 (ref 5–15)
BUN: 17 mg/dL (ref 8–23)
CO2: 26 mmol/L (ref 22–32)
Calcium: 9.3 mg/dL (ref 8.9–10.3)
Chloride: 107 mmol/L (ref 98–111)
Creatinine, Ser: 1.04 mg/dL (ref 0.61–1.24)
GFR, Estimated: >60 mL/min (ref >60)
Glucose, Bld: 110 mg/dL — ABNORMAL HIGH (ref 70–99)
Potassium: 4.1 mmol/L (ref 3.5–5.1)
Sodium: 139 mmol/L (ref 135–145)
Total Bilirubin: 1 mg/dL (ref 0.3–1.2)
Total Protein: 7.2 g/dL (ref 6.5–8.1)

## 2021-08-23 LAB — CBC
HCT: 42.6 % (ref 39.0–52.0)
Hemoglobin: 14.5 g/dL (ref 13.0–17.0)
MCH: 31 pg (ref 26.0–34.0)
MCHC: 34 g/dL (ref 30.0–36.0)
MCV: 91.2 fL (ref 80.0–100.0)
Platelets: 171 10*3/uL (ref 150–400)
RBC: 4.67 MIL/uL (ref 4.22–5.81)
RDW: 12.4 % (ref 11.5–15.5)
WBC: 10.1 10*3/uL (ref 4.0–10.5)
nRBC: 0 % (ref 0.0–0.2)

## 2021-08-23 LAB — SURGICAL PCR SCREEN
MRSA, PCR: NEGATIVE
Staphylococcus aureus: POSITIVE — AB

## 2021-08-23 MED ORDER — OXYCODONE-ACETAMINOPHEN 5-325 MG PO TABS: 1.0000 | ORAL_TABLET | Freq: Four times a day (QID) | ORAL | 0 refills | Status: DC | PRN

## 2021-08-23 MED ORDER — ONDANSETRON HCL 4 MG PO TABS: 4.0000 mg | ORAL_TABLET | Freq: Three times a day (TID) | ORAL | 0 refills | Status: DC | PRN

## 2021-08-23 MED ORDER — DOCUSATE SODIUM 100 MG PO CAPS: 100.0000 mg | ORAL_CAPSULE | Freq: Every day | ORAL | 2 refills | Status: AC | PRN

## 2021-08-23 MED ORDER — ASPIRIN 81 MG PO TBEC: 81.0000 mg | DELAYED_RELEASE_TABLET | Freq: Two times a day (BID) | ORAL | 2 refills | Status: DC

## 2021-08-23 MED ORDER — METHOCARBAMOL 750 MG PO TABS: 750.0000 mg | ORAL_TABLET | Freq: Two times a day (BID) | ORAL | 2 refills | Status: DC | PRN

## 2021-08-23 NOTE — Progress Notes (Signed)
PCP - Dr. Deland Pretty Cardiologist - Dr. Shelva Majestic  PPM/ICD - denies   Chest x-ray - 06/22/19 EKG - 08/23/21 Stress Test - 10/21/14 ECHO - 06/23/19 Cardiac Cath - 12/09/14  Sleep Study - Around 20 yrs ago per pt, OSA+ CPAP - nightly  DM- denies  Blood Thinner Instructions: n/a Aspirin Instructions: f/u with surgeon for instructions  ERAS Protcol - yes PRE-SURGERY Ensure given at PAT  COVID TEST- n/a   Anesthesia review: yes, cardiac hx  Patient denies shortness of breath, fever, cough and chest pain at PAT appointment   All instructions explained to the patient, with a verbal understanding of the material. Patient agrees to go over the instructions while at home for a better understanding. The opportunity to ask questions was provided.

## 2021-08-24 ENCOUNTER — Encounter (HOSPITAL_COMMUNITY): Payer: Self-pay

## 2021-08-24 NOTE — Anesthesia Preprocedure Evaluation (Addendum)
Anesthesia Evaluation  Patient identified by MRN, date of birth, ID band Patient awake    Reviewed: Allergy & Precautions, NPO status , Patient's Chart, lab work & pertinent test results, reviewed documented beta blocker date and time   History of Anesthesia Complications Negative for: history of anesthetic complications  Airway Mallampati: II  TM Distance: >3 FB Neck ROM: Full    Dental   Pulmonary sleep apnea and Continuous Positive Airway Pressure Ventilation , former smoker,    Pulmonary exam normal        Cardiovascular Exercise Tolerance: Good hypertension, Pt. on medications and Pt. 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, s/p coiling (2001), VP shunt (2001)    GI/Hepatic Neg liver ROS, GERD  ,  Endo/Other  negative endocrine ROS  Renal/GU negative Renal ROS     Musculoskeletal negative musculoskeletal ROS (+)   Abdominal   Peds  Hematology negative hematology ROS (+)   Anesthesia Other Findings  Plts 171  Reproductive/Obstetrics                           Anesthesia Physical Anesthesia Plan  ASA: 3  Anesthesia Plan: General   Post-op Pain Management: Tylenol PO (pre-op)*   Induction: Intravenous  PONV Risk Score and Plan: 2 and Ondansetron, Dexamethasone and Treatment may vary due to age or medical condition  Airway Management Planned: Oral ETT  Additional Equipment: None  Intra-op Plan:   Post-operative Plan: Extubation in OR  Informed Consent: I have reviewed the patients History and Physical, chart, labs and discussed the procedure including the risks, benefits and alternatives for the proposed anesthesia with the patient or authorized representative who has indicated his/her understanding and acceptance.      Dental advisory given  Plan Discussed with:   Anesthesia Plan Comments:       Anesthesia Quick Evaluation

## 2021-08-24 NOTE — Progress Notes (Signed)
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   Preoperative cardiology input outlined by Fabian Sharp, PA-C on 07/22/21. She wrote: "He has a history of CAD and CHF. He does not require insulin and sCr is below 2. He is able to achieve 4.0 METS without angina. He understands he has a 6.6% risk of MACE during the perioperative period. He denies angina, exertional symptoms, and signs and symptoms of heart failure. I do not think additional testing would mitigate his risk. He is optimized from a cardiac perspective. Given his heart disease, would recommend continuing ASA if possible, will defer to surgeon.    Therefore, based on ACC/AHA guidelines, the patient would be at acceptable risk for the planned procedure without further cardiovascular testing."    Of note, he had previous EP evaluation by Dr. Caryl Comes on 03/06/20 for consideration of ICD given persistent LV dysfunction despite guideline directed medical therapy. EF had improved to 30-35% in May 2021 since on [place]. 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 Given age, borderline EF 30-35%, fairly borderline class II symptoms, and results from [place] trial, Dr. Caryl Comes did not feel there was a strong indication for ICD. However, in the future he may be a candidate for CRT pacing  should he become more asymptomatic.   Anesthesia team to evaluate on the day of surgery.   VS: BP (!) 141/73   Pulse (!) 57   Temp 36.5 C   Resp 17   Ht '5\' 11"'$  (1.803 m)   Wt 91.9 kg   SpO2 97%   BMI 28.27 kg/m    PROVIDERS: Deland Pretty, MD is PCP Shelva Majestic, MD is primary cardiologist Virl Axe, MD is EP cardiologist    LABS: Labs reviewed: Acceptable for surgery. (all labs ordered are listed, but only abnormal results are displayed)  Labs Reviewed  SURGICAL PCR SCREEN - Abnormal; Notable for the following components:      Result Value   Staphylococcus aureus POSITIVE (*)    All other components within normal limits  COMPREHENSIVE METABOLIC PANEL - Abnormal; Notable for the following components:   Glucose, Bld 110 (*)    All other components within normal limits  CBC    EKG: 08/23/21:  Ventricular rate 53 bpm Sinus bradycardia with 1st degree A-V block Left axis deviation Left bundle branch block Abnormal ECG No significant change since last tracing Confirmed by Lauree Chandler [phone]) on 08/23/2021 12:42:41 PM   CV: Echo 06/23/19: IMPRESSIONS   1. Left ventricular ejection fraction, by estimation, is 30 to 35%. The  left ventricle has moderately decreased function. The left ventricle  demonstrates global hypokinesis. There is mild concentric left ventricular  hypertrophy. Left ventricular  diastolic parameters are consistent with Grade I diastolic dysfunction  (impaired relaxation). Elevated left ventricular end-diastolic pressure.   2. The mitral valve is degenerative. Mild  mitral valve regurgitation.   3. The aortic valve is tricuspid. Aortic valve regurgitation is mild. No  aortic stenosis is present.   4. Aortic dilatation noted. 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 There is mild dilatation of the aortic root measuring 41  mm.   5. Right ventricular systolic function is mildly reduced. The right  ventricular  size is normal.   6. Left atrial size was mild to moderately dilated.   7. The inferior vena cava is normal in size with greater than 50%  respiratory variability, suggesting right atrial pressure of 3 mmHg.  - Comparison: EF 25-30%, LV global hypokinesis 04/11/18; EF 20-25% 09/04/17; EF 35% 07/30/15; EF 30% 10/23/14   LHC 12/09/14: Mid LAD lesion, 30% stenosed. Dist RCA-1 lesion, 50% stenosed. Dist RCA-2 lesion, 65% stenosed. Post Atrio lesion, 100% stenosed.   Coronary obstructive disease with very short short left main, which immediately bifurcated into the LAD and left circumflex vessel.  The LAD had 30% proximal stenosis immediately after the first diagonal branch.  The circumflex vessel was a large caliber vessel, and there was extensive collateralization to the distal RCA branches.  The RCA immediately gave rise to a conus branch and was tortuous.  There was diffuse 50-60% stenoses proximal and medially beyond the acute margin prior to a large PDA vessel.  The distal RCA after second small inferior LV branch was totally occluded.  There are extensive collateralization to the distal RCA via the left injection.   Previous documentation of reduced LV function with an ejection fraction of 30% by echocardiography.   RECOMMENDATION: The patient has not experienced any significant anginal type symptoms.  His nuclear study did show predominant inferior ischemia which is due to his distal RCA occlusion.  Initial recommendation is medical therapy , both for his ischemia as well as reduced LV function.   Past Medical History:  Diagnosis Date   Acute bronchitis 02/04/2015   Arthritis    Cancer ([place])    bladder   Confusion    Depression    Dysrhythmia    LBBB   Edema    feet/ankles   GERD (gastroesophageal reflux disease)    HOH (hard of hearing)    aids   Hypertension    Kidney stones    MVP (mitral valve prolapse)    Prostate disease    Sleep apnea    CPAP    Past Surgical History:   Procedure Laterality Date   BACK SURGERY     BLADDER SURGERY     cancer   CARDIAC CATHETERIZATION N/A 12/09/2014   Procedure: Left Heart Cath and Coronary Angiography;  Surgeon: Troy Sine, MD;  Location: [place] [facility];  Service: Cardiovascular;  Laterality: N/A;   CATARACT EXTRACTION W/PHACO Right 09/08/2014   Procedure: CATARACT EXTRACTION PHACO AND INTRAOCULAR LENS PLACEMENT (IOC);  Surgeon: Estill Cotta, MD;  Location: [facility] ORS;  Service: Ophthalmology;  Laterality: Right;  US:1:06.3 AP%:23.3 CDE:24.97 Fluid lot# [id number]   CEREBRAL ANEURYSM REPAIR  2001   coil and shunt placement   CYSTOSCOPY     litho x 2   KNEE ARTHROSCOPY Right    TONSILLECTOMY     tonsils and adenoids removed as a child   VENTRICULOPERITONEAL SHUNT  2001   WRIST SURGERY Bilateral    ctr    MEDICATIONS:  amLODipine (NORVASC) 5 MG tablet   aspirin EC 81 MG tablet   atorvastatin (LIPITOR) 40 MG tablet   Calcium Carb-Cholecalciferol (CALCIUM 600-D  PO)   carvedilol (COREG) 6.25 MG tablet   citalopram (CELEXA) 40 MG tablet   docusate sodium (COLACE) 100 MG capsule   ENTRESTO 97-103 MG   finasteride (PROSCAR) 5 MG tablet   magnesium oxide (MAG-OX) 400 MG tablet   methocarbamol (ROBAXIN-750) 750 MG tablet   Multiple Vitamins-Minerals (OCUVITE-LUTEIN PO)   ondansetron (ZOFRAN) 4 MG tablet   oxyCODONE-acetaminophen (PERCOCET) 5-325 MG tablet   Polyethyl Glycol-Propyl Glycol (SYSTANE OP)   polyethylene glycol powder (MIRALAX) 17 GM/SCOOP powder   Potassium Chloride ER 20 MEQ TBCR   spironolactone (ALDACTONE) 25 MG tablet   vitamin B-12 (CYANOCOBALAMIN) 500 MCG tablet   zolpidem (AMBIEN) 5 MG tablet   No current facility-administered medications for this encounter.    Myra Gianotti, PA-C Surgical Short Stay/Anesthesiology [facility] Phone [phone] [facility] Phone [phone] 08/24/2021 6:54 PM

## 2021-08-27 MED ORDER — TRANEXAMIC ACID 1000 MG/10ML IV SOLN
2000.0000 mg | INTRAVENOUS | Status: DC
  Filled 2021-08-27: qty 20

## 2021-08-30 ENCOUNTER — Observation Stay (HOSPITAL_COMMUNITY): Payer: PPO

## 2021-08-30 ENCOUNTER — Other Ambulatory Visit: Payer: Self-pay

## 2021-08-30 ENCOUNTER — Observation Stay (HOSPITAL_COMMUNITY)
Admission: RE | Admit: 2021-08-30 | Discharge: 2021-08-31 | Disposition: A | Discharge status: Home Health | Payer: PPO | Attending: Orthopaedic Surgery | Admitting: Orthopaedic Surgery

## 2021-08-30 ENCOUNTER — Encounter (HOSPITAL_COMMUNITY): Payer: Self-pay | Admitting: Orthopaedic Surgery

## 2021-08-30 ENCOUNTER — Encounter (HOSPITAL_COMMUNITY)
Admission: RE | Disposition: A | Discharge status: Home Health | Payer: Self-pay | Source: Home / Self Care | Attending: Orthopaedic Surgery

## 2021-08-30 ENCOUNTER — Ambulatory Visit (HOSPITAL_COMMUNITY): Payer: PPO

## 2021-08-30 ENCOUNTER — Ambulatory Visit (HOSPITAL_COMMUNITY): Payer: PPO | Admitting: Vascular Surgery

## 2021-08-30 ENCOUNTER — Ambulatory Visit (HOSPITAL_BASED_OUTPATIENT_CLINIC_OR_DEPARTMENT_OTHER): Payer: PPO | Admitting: Anesthesiology

## 2021-08-30 DIAGNOSIS — Z8551 Personal history of malignant neoplasm of bladder: Secondary | ICD-10-CM | POA: Diagnosis not present

## 2021-08-30 DIAGNOSIS — I509 Heart failure, unspecified: Secondary | ICD-10-CM

## 2021-08-30 DIAGNOSIS — Z87891 Personal history of nicotine dependence: Secondary | ICD-10-CM | POA: Insufficient documentation

## 2021-08-30 DIAGNOSIS — I10 Essential (primary) hypertension: Secondary | ICD-10-CM | POA: Insufficient documentation

## 2021-08-30 DIAGNOSIS — I110 Hypertensive heart disease with heart failure: Secondary | ICD-10-CM

## 2021-08-30 DIAGNOSIS — M1612 Unilateral primary osteoarthritis, left hip: Secondary | ICD-10-CM | POA: Diagnosis present

## 2021-08-30 DIAGNOSIS — I2510 Atherosclerotic heart disease of native coronary artery without angina pectoris: Secondary | ICD-10-CM

## 2021-08-30 DIAGNOSIS — Z7982 Long term (current) use of aspirin: Secondary | ICD-10-CM | POA: Insufficient documentation

## 2021-08-30 DIAGNOSIS — Z79899 Other long term (current) drug therapy: Secondary | ICD-10-CM | POA: Insufficient documentation

## 2021-08-30 DIAGNOSIS — Z96642 Presence of left artificial hip joint: Secondary | ICD-10-CM

## 2021-08-30 HISTORY — PX: TOTAL HIP ARTHROPLASTY: SHX124

## 2021-08-30 SURGERY — ARTHROPLASTY, HIP, TOTAL, ANTERIOR APPROACH
Anesthesia: Spinal | Site: Hip | Laterality: Left

## 2021-08-30 MED ORDER — BUPIVACAINE-MELOXICAM ER 400-12 MG/14ML IJ SOLN
INTRAMUSCULAR | Status: DC | PRN
  Administered 2021-08-30: 400 mg

## 2021-08-30 MED ORDER — METOCLOPRAMIDE HCL 5 MG PO TABS: 5.0000 mg | ORAL_TABLET | Freq: Three times a day (TID) | ORAL | Status: DC | PRN

## 2021-08-30 MED ORDER — POLYETHYLENE GLYCOL 3350 17 G PO PACK
17.0000 g | PACK | Freq: Every day | ORAL | Status: DC
  Administered 2021-08-30 – 2021-08-31 (×2): 17 g via ORAL
  Filled 2021-08-30 (×2): qty 1

## 2021-08-30 MED ORDER — DEXAMETHASONE SODIUM PHOSPHATE 10 MG/ML IJ SOLN
INTRAMUSCULAR | Status: DC | PRN
  Administered 2021-08-30: 10 mg via INTRAVENOUS

## 2021-08-30 MED ORDER — PHENYLEPHRINE HCL-NACL 20-0.9 MG/250ML-% IV SOLN
INTRAVENOUS | Status: DC | PRN
  Administered 2021-08-30: 20 ug/min via INTRAVENOUS

## 2021-08-30 MED ORDER — SORBITOL 70 % SOLN: 30.0000 mL | Freq: Every day | Status: DC | PRN

## 2021-08-30 MED ORDER — ORAL CARE MOUTH RINSE: 15.0000 mL | Freq: Once | OROMUCOSAL | Status: AC

## 2021-08-30 MED ORDER — ROCURONIUM BROMIDE 10 MG/ML (PF) SYRINGE
PREFILLED_SYRINGE | INTRAVENOUS | Status: DC | PRN
  Administered 2021-08-30: 20 mg via INTRAVENOUS
  Administered 2021-08-30: 100 mg via INTRAVENOUS

## 2021-08-30 MED ORDER — DEXAMETHASONE SODIUM PHOSPHATE 10 MG/ML IJ SOLN
10.0000 mg | Freq: Once | INTRAMUSCULAR | Status: AC
  Administered 2021-08-31: 10 mg via INTRAVENOUS
  Filled 2021-08-30: qty 1

## 2021-08-30 MED ORDER — LIDOCAINE 2% (20 MG/ML) 5 ML SYRINGE
INTRAMUSCULAR | Status: DC | PRN
  Administered 2021-08-30: 100 mg via INTRAVENOUS

## 2021-08-30 MED ORDER — BUPIVACAINE-MELOXICAM ER 400-12 MG/14ML IJ SOLN
INTRAMUSCULAR | Status: AC
  Filled 2021-08-30: qty 1

## 2021-08-30 MED ORDER — OXYCODONE HCL ER 10 MG PO T12A
10.0000 mg | EXTENDED_RELEASE_TABLET | Freq: Two times a day (BID) | ORAL | Status: DC
  Administered 2021-08-30 – 2021-08-31 (×3): 10 mg via ORAL
  Filled 2021-08-30 (×3): qty 1

## 2021-08-30 MED ORDER — VANCOMYCIN HCL 1 G IV SOLR
INTRAVENOUS | Status: DC | PRN
  Administered 2021-08-30: 1000 mg

## 2021-08-30 MED ORDER — SODIUM CHLORIDE 0.9 % IR SOLN
Status: DC | PRN
  Administered 2021-08-30: 1000 mL

## 2021-08-30 MED ORDER — ASPIRIN 81 MG PO CHEW
81.0000 mg | CHEWABLE_TABLET | Freq: Two times a day (BID) | ORAL | Status: DC
  Administered 2021-08-30 – 2021-08-31 (×2): 81 mg via ORAL
  Filled 2021-08-30 (×2): qty 1

## 2021-08-30 MED ORDER — OXYCODONE HCL 5 MG/5ML PO SOLN: 5.0000 mg | Freq: Once | ORAL | Status: AC | PRN

## 2021-08-30 MED ORDER — OXYCODONE HCL 5 MG PO TABS
5.0000 mg | ORAL_TABLET | Freq: Once | ORAL | Status: AC | PRN
  Administered 2021-08-30: 5 mg via ORAL

## 2021-08-30 MED ORDER — DIPHENHYDRAMINE HCL 12.5 MG/5ML PO ELIX: 25.0000 mg | ORAL_SOLUTION | ORAL | Status: DC | PRN

## 2021-08-30 MED ORDER — OXYCODONE HCL 5 MG PO TABS: 10.0000 mg | ORAL_TABLET | ORAL | Status: DC | PRN

## 2021-08-30 MED ORDER — ONDANSETRON HCL 4 MG/2ML IJ SOLN
INTRAMUSCULAR | Status: DC | PRN
  Administered 2021-08-30: 4 mg via INTRAVENOUS

## 2021-08-30 MED ORDER — ACETAMINOPHEN 325 MG PO TABS
325.0000 mg | ORAL_TABLET | Freq: Four times a day (QID) | ORAL | Status: DC | PRN
  Filled 2021-08-30: qty 2

## 2021-08-30 MED ORDER — AMISULPRIDE (ANTIEMETIC) 5 MG/2ML IV SOLN: 10.0000 mg | Freq: Once | INTRAVENOUS | Status: DC | PRN

## 2021-08-30 MED ORDER — DOCUSATE SODIUM 100 MG PO CAPS
100.0000 mg | ORAL_CAPSULE | Freq: Two times a day (BID) | ORAL | Status: DC
  Administered 2021-08-30 – 2021-08-31 (×3): 100 mg via ORAL
  Filled 2021-08-30 (×3): qty 1

## 2021-08-30 MED ORDER — METOCLOPRAMIDE HCL 5 MG/ML IJ SOLN: 5.0000 mg | Freq: Three times a day (TID) | INTRAMUSCULAR | Status: DC | PRN

## 2021-08-30 MED ORDER — OXYCODONE HCL 5 MG PO TABS
5.0000 mg | ORAL_TABLET | ORAL | Status: DC | PRN
  Administered 2021-08-30: 10 mg via ORAL
  Administered 2021-08-31: 5 mg via ORAL
  Filled 2021-08-30: qty 2
  Filled 2021-08-30: qty 1

## 2021-08-30 MED ORDER — FERROUS SULFATE 325 (65 FE) MG PO TABS
325.0000 mg | ORAL_TABLET | Freq: Three times a day (TID) | ORAL | Status: DC
  Administered 2021-08-30 – 2021-08-31 (×4): 325 mg via ORAL
  Filled 2021-08-30 (×4): qty 1

## 2021-08-30 MED ORDER — ONDANSETRON HCL 4 MG/2ML IJ SOLN: 4.0000 mg | Freq: Four times a day (QID) | INTRAMUSCULAR | Status: DC | PRN

## 2021-08-30 MED ORDER — ONDANSETRON HCL 4 MG PO TABS: 4.0000 mg | ORAL_TABLET | Freq: Four times a day (QID) | ORAL | Status: DC | PRN

## 2021-08-30 MED ORDER — ALUM & MAG HYDROXIDE-SIMETH 200-200-20 MG/5ML PO SUSP: 30.0000 mL | ORAL | Status: DC | PRN

## 2021-08-30 MED ORDER — PRONTOSAN WOUND IRRIGATION OPTIME
TOPICAL | Status: DC | PRN
  Administered 2021-08-30: 1

## 2021-08-30 MED ORDER — CARVEDILOL 6.25 MG PO TABS
6.2500 mg | ORAL_TABLET | Freq: Two times a day (BID) | ORAL | Status: DC
  Administered 2021-08-30: 6.25 mg via ORAL
  Filled 2021-08-30: qty 1

## 2021-08-30 MED ORDER — ACETAMINOPHEN 500 MG PO TABS
1000.0000 mg | ORAL_TABLET | Freq: Four times a day (QID) | ORAL | Status: AC
  Administered 2021-08-30 – 2021-08-31 (×4): 1000 mg via ORAL
  Filled 2021-08-30 (×4): qty 2

## 2021-08-30 MED ORDER — TRANEXAMIC ACID-NACL 1000-0.7 MG/100ML-% IV SOLN
1000.0000 mg | Freq: Once | INTRAVENOUS | Status: AC
  Administered 2021-08-30: 1000 mg via INTRAVENOUS
  Filled 2021-08-30: qty 100

## 2021-08-30 MED ORDER — CEFAZOLIN SODIUM-DEXTROSE 2-4 GM/100ML-% IV SOLN
2.0000 g | Freq: Four times a day (QID) | INTRAVENOUS | Status: AC
  Administered 2021-08-30 (×2): 2 g via INTRAVENOUS
  Filled 2021-08-30 (×2): qty 100

## 2021-08-30 MED ORDER — CEFAZOLIN SODIUM-DEXTROSE 2-4 GM/100ML-% IV SOLN
2.0000 g | INTRAVENOUS | Status: AC
  Administered 2021-08-30: 2 g via INTRAVENOUS

## 2021-08-30 MED ORDER — FINASTERIDE 5 MG PO TABS
5.0000 mg | ORAL_TABLET | Freq: Every day | ORAL | Status: DC
  Administered 2021-08-31: 5 mg via ORAL
  Filled 2021-08-30: qty 1

## 2021-08-30 MED ORDER — METHOCARBAMOL 500 MG PO TABS: 500.0000 mg | ORAL_TABLET | Freq: Four times a day (QID) | ORAL | Status: DC | PRN

## 2021-08-30 MED ORDER — CEFAZOLIN SODIUM-DEXTROSE 2-4 GM/100ML-% IV SOLN
INTRAVENOUS | Status: AC
  Filled 2021-08-30: qty 100

## 2021-08-30 MED ORDER — PROPOFOL 10 MG/ML IV BOLUS
INTRAVENOUS | Status: DC | PRN
  Administered 2021-08-30: 120 mg via INTRAVENOUS

## 2021-08-30 MED ORDER — LACTATED RINGERS IV SOLN: INTRAVENOUS | Status: DC

## 2021-08-30 MED ORDER — SACUBITRIL-VALSARTAN 97-103 MG PO TABS
1.0000 | ORAL_TABLET | Freq: Two times a day (BID) | ORAL | Status: DC
  Administered 2021-08-30 – 2021-08-31 (×3): 1 via ORAL
  Filled 2021-08-30 (×5): qty 1

## 2021-08-30 MED ORDER — PANTOPRAZOLE SODIUM 40 MG PO TBEC
40.0000 mg | DELAYED_RELEASE_TABLET | Freq: Every day | ORAL | Status: DC
  Administered 2021-08-30 – 2021-08-31 (×2): 40 mg via ORAL
  Filled 2021-08-30 (×2): qty 1

## 2021-08-30 MED ORDER — SUGAMMADEX SODIUM 200 MG/2ML IV SOLN
INTRAVENOUS | Status: DC | PRN
  Administered 2021-08-30: 200 mg via INTRAVENOUS

## 2021-08-30 MED ORDER — PHENOL 1.4 % MT LIQD: 1.0000 | OROMUCOSAL | Status: DC | PRN

## 2021-08-30 MED ORDER — CHLORHEXIDINE GLUCONATE 0.12 % MT SOLN
15.0000 mL | Freq: Once | OROMUCOSAL | Status: AC
  Administered 2021-08-30: 15 mL via OROMUCOSAL
  Filled 2021-08-30: qty 15

## 2021-08-30 MED ORDER — POVIDONE-IODINE 10 % EX SWAB
2.0000 | Freq: Once | CUTANEOUS | Status: AC
  Administered 2021-08-30: 2 via TOPICAL

## 2021-08-30 MED ORDER — TRANEXAMIC ACID 1000 MG/10ML IV SOLN
INTRAVENOUS | Status: DC | PRN
  Administered 2021-08-30: 2000 mg via TOPICAL

## 2021-08-30 MED ORDER — VANCOMYCIN HCL 1000 MG IV SOLR
INTRAVENOUS | Status: AC
  Filled 2021-08-30: qty 20

## 2021-08-30 MED ORDER — ONDANSETRON HCL 4 MG/2ML IJ SOLN: 4.0000 mg | Freq: Once | INTRAMUSCULAR | Status: DC | PRN

## 2021-08-30 MED ORDER — HYDROMORPHONE HCL 1 MG/ML IJ SOLN: 0.5000 mg | INTRAMUSCULAR | Status: DC | PRN

## 2021-08-30 MED ORDER — FENTANYL CITRATE (PF) 250 MCG/5ML IJ SOLN
INTRAMUSCULAR | Status: AC
  Filled 2021-08-30: qty 5

## 2021-08-30 MED ORDER — MENTHOL 3 MG MT LOZG
1.0000 | LOZENGE | OROMUCOSAL | Status: DC | PRN
Start: 2021-08-30 — End: 2021-08-31

## 2021-08-30 MED ORDER — TRANEXAMIC ACID-NACL 1000-0.7 MG/100ML-% IV SOLN
1000.0000 mg | INTRAVENOUS | Status: AC
  Administered 2021-08-30: 1000 mg via INTRAVENOUS
  Filled 2021-08-30: qty 100

## 2021-08-30 MED ORDER — EPHEDRINE SULFATE-NACL 50-0.9 MG/10ML-% IV SOSY
PREFILLED_SYRINGE | INTRAVENOUS | Status: DC | PRN
  Administered 2021-08-30 (×5): 5 mg via INTRAVENOUS

## 2021-08-30 MED ORDER — FENTANYL CITRATE (PF) 250 MCG/5ML IJ SOLN
INTRAMUSCULAR | Status: DC | PRN
Start: 2021-08-30 — End: 2021-08-30
  Administered 2021-08-30: 25 ug via INTRAVENOUS
  Administered 2021-08-30: 50 ug via INTRAVENOUS
  Administered 2021-08-30: 100 ug via INTRAVENOUS
  Administered 2021-08-30: 25 ug via INTRAVENOUS

## 2021-08-30 MED ORDER — METHOCARBAMOL 1000 MG/10ML IJ SOLN
500.0000 mg | Freq: Four times a day (QID) | INTRAVENOUS | Status: DC | PRN
  Filled 2021-08-30: qty 5

## 2021-08-30 MED ORDER — SPIRONOLACTONE 12.5 MG HALF TABLET
12.5000 mg | ORAL_TABLET | Freq: Every day | ORAL | Status: DC
  Administered 2021-08-30 – 2021-08-31 (×2): 12.5 mg via ORAL
  Filled 2021-08-30 (×2): qty 1

## 2021-08-30 MED ORDER — 0.9 % SODIUM CHLORIDE (POUR BTL) OPTIME
TOPICAL | Status: DC | PRN
  Administered 2021-08-30: 1000 mL

## 2021-08-30 MED ORDER — SODIUM CHLORIDE 0.9 % IV SOLN: INTRAVENOUS | Status: DC

## 2021-08-30 MED ORDER — FENTANYL CITRATE (PF) 100 MCG/2ML IJ SOLN
25.0000 ug | INTRAMUSCULAR | Status: DC | PRN
  Administered 2021-08-30: 25 ug via INTRAVENOUS

## 2021-08-30 MED ORDER — AMLODIPINE BESYLATE 5 MG PO TABS
5.0000 mg | ORAL_TABLET | Freq: Every day | ORAL | Status: DC
  Administered 2021-08-31: 5 mg via ORAL
  Filled 2021-08-30: qty 1

## 2021-08-30 MED ORDER — FENTANYL CITRATE (PF) 100 MCG/2ML IJ SOLN
INTRAMUSCULAR | Status: AC
  Filled 2021-08-30: qty 2

## 2021-08-30 MED ORDER — OXYCODONE HCL 5 MG PO TABS
ORAL_TABLET | ORAL | Status: AC
  Filled 2021-08-30: qty 1

## 2021-08-30 SURGICAL SUPPLY — 67 items
ACETAB CUP W/GRIPTION 54 (Plate) ×2 IMPLANT
BAG COUNTER SPONGE SURGICOUNT (BAG) ×2 IMPLANT
BAG DECANTER FOR FLEXI CONT (MISCELLANEOUS) ×2 IMPLANT
CELLS DAT CNTRL 66122 CELL SVR (MISCELLANEOUS) IMPLANT
COOLER ICEMAN CLASSIC (MISCELLANEOUS) ×1 IMPLANT
COVER PERINEAL POST (MISCELLANEOUS) ×2 IMPLANT
COVER SURGICAL LIGHT HANDLE (MISCELLANEOUS) ×2 IMPLANT
CUP ACETAB W/GRIPTION 54 (Plate) IMPLANT
DERMABOND ADVANCED (GAUZE/BANDAGES/DRESSINGS) ×1
DERMABOND ADVANCED .7 DNX12 (GAUZE/BANDAGES/DRESSINGS) IMPLANT
DRAPE C-ARM 42X72 X-RAY (DRAPES) ×2 IMPLANT
DRAPE POUCH INSTRU U-SHP 10X18 (DRAPES) ×2 IMPLANT
DRAPE STERI IOBAN 125X83 (DRAPES) ×2 IMPLANT
DRAPE U-SHAPE 47X51 STRL (DRAPES) ×4 IMPLANT
DRSG AQUACEL AG ADV 3.5X10 (GAUZE/BANDAGES/DRESSINGS) ×1 IMPLANT
DURAPREP 26ML APPLICATOR (WOUND CARE) ×4 IMPLANT
ELECT BLADE 4.0 EZ CLEAN MEGAD (MISCELLANEOUS) ×2
ELECT REM PT RETURN 9FT ADLT (ELECTROSURGICAL) ×2
ELECTRODE BLDE 4.0 EZ CLN MEGD (MISCELLANEOUS) ×1 IMPLANT
ELECTRODE REM PT RTRN 9FT ADLT (ELECTROSURGICAL) ×1 IMPLANT
GLOVE BIOGEL PI IND STRL 7.0 (GLOVE) ×1 IMPLANT
GLOVE BIOGEL PI IND STRL 7.5 (GLOVE) ×4 IMPLANT
GLOVE BIOGEL PI INDICATOR 7.0 (GLOVE) ×1
GLOVE BIOGEL PI INDICATOR 7.5 (GLOVE) ×4
GLOVE ECLIPSE 7.0 STRL STRAW (GLOVE) ×4 IMPLANT
GLOVE SKINSENSE NS SZ7.5 (GLOVE) ×1
GLOVE SKINSENSE STRL SZ7.5 (GLOVE) ×1 IMPLANT
GLOVE SURG UNDER POLY LF SZ7 (GLOVE) ×2 IMPLANT
GLOVE SURG UNDER POLY LF SZ7.5 (GLOVE) ×4 IMPLANT
GOWN STRL REIN XL XLG (GOWN DISPOSABLE) ×2 IMPLANT
GOWN STRL REUS W/ TWL LRG LVL3 (GOWN DISPOSABLE) IMPLANT
GOWN STRL REUS W/ TWL XL LVL3 (GOWN DISPOSABLE) ×1 IMPLANT
GOWN STRL REUS W/TWL LRG LVL3 (GOWN DISPOSABLE)
GOWN STRL REUS W/TWL XL LVL3 (GOWN DISPOSABLE) ×2
HANDPIECE INTERPULSE COAX TIP (DISPOSABLE) ×2
HEAD M SROM 36MM PLUS 1.5 (Hips) IMPLANT
HOOD PEEL AWAY FLYTE STAYCOOL (MISCELLANEOUS) ×5 IMPLANT
IV NS IRRIG 3000ML ARTHROMATIC (IV SOLUTION) ×2 IMPLANT
JET LAVAGE IRRISEPT WOUND (IRRIGATION / IRRIGATOR) ×2
KIT BASIN OR (CUSTOM PROCEDURE TRAY) ×2 IMPLANT
LAVAGE JET IRRISEPT WOUND (IRRIGATION / IRRIGATOR) ×1 IMPLANT
LINER NEUTRAL 54X36MM PLUS 4 (Hips) ×1 IMPLANT
MARKER SKIN DUAL TIP RULER LAB (MISCELLANEOUS) ×3 IMPLANT
NDL SPNL 18GX3.5 QUINCKE PK (NEEDLE) ×1 IMPLANT
NEEDLE SPNL 18GX3.5 QUINCKE PK (NEEDLE) ×2 IMPLANT
PACK TOTAL JOINT (CUSTOM PROCEDURE TRAY) ×2 IMPLANT
PACK UNIVERSAL I (CUSTOM PROCEDURE TRAY) ×2 IMPLANT
RETRACTOR WND ALEXIS 18 MED (MISCELLANEOUS) IMPLANT
RTRCTR WOUND ALEXIS 18CM MED (MISCELLANEOUS)
SAW OSC TIP CART 19.5X105X1.3 (SAW) ×2 IMPLANT
SCREW 6.5MMX25MM (Screw) ×1 IMPLANT
SET HNDPC FAN SPRY TIP SCT (DISPOSABLE) ×1 IMPLANT
SROM M HEAD 36MM PLUS 1.5 (Hips) ×2 IMPLANT
STAPLER VISISTAT 35W (STAPLE) IMPLANT
STEM FEM ACTIS STD SZ7 (Nail) ×1 IMPLANT
SUT ETHIBOND 2 V 37 (SUTURE) ×2 IMPLANT
SUT ETHILON 2 0 PSLX (SUTURE) ×3 IMPLANT
SUT VIC AB 0 CT1 27 (SUTURE) ×2
SUT VIC AB 0 CT1 27XBRD ANBCTR (SUTURE) ×1 IMPLANT
SUT VIC AB 1 CT1 36 (SUTURE) ×1 IMPLANT
SUT VIC AB 1 CTX 36 (SUTURE) ×2
SUT VIC AB 1 CTX36XBRD ANBCTR (SUTURE) ×1 IMPLANT
SUT VIC AB 2-0 CT1 27 (SUTURE) ×4
SUT VIC AB 2-0 CT1 TAPERPNT 27 (SUTURE) ×2 IMPLANT
SYR 50ML LL SCALE MARK (SYRINGE) ×2 IMPLANT
TOWEL GREEN STERILE (TOWEL DISPOSABLE) ×2 IMPLANT
YANKAUER SUCT BULB TIP NO VENT (SUCTIONS) ×2 IMPLANT

## 2021-08-30 NOTE — Op Note (Signed)
LEFT TOTAL HIP ARTHROPLASTY ANTERIOR APPROACH  Procedure Note Trevor Schmidt   [id number]  Pre-op Diagnosis: left hip degenerative joint disease     Post-op Diagnosis: same   Operative Procedures  1. Total hip replacement; Left hip; uncemented [id number]   Surgeon: Frankey Shown, M.D.  Assist: Madalyn Rob, PA-C   Anesthesia: general  Prosthesis: Depuy Acetabulum: Pinnacle 54 mm Femur: Actis 7 STD Head: 36 mm size: +1.5 Liner: +4 Bearing Type: metal on poly  Total Hip Arthroplasty (Anterior Approach) Op Note:  After informed consent was obtained and the operative extremity marked in the holding area, the patient was brought back to the operating room and placed supine on the HANA table. Next, the operative extremity was prepped and draped in normal sterile fashion. Surgical timeout occurred verifying patient identification, surgical site, surgical procedure and administration of antibiotics.  A modified anterior Smith-Peterson approach to the hip was performed, using the interval between tensor fascia lata and sartorius.  Dissection was carried bluntly down onto the anterior hip capsule. The lateral femoral circumflex vessels were identified and coagulated. A capsulotomy was performed and the capsular flaps tagged for later repair.  The neck osteotomy was performed. The femoral head was removed which showed severe bone on bone degenerative disease, the acetabular rim was cleared of soft tissue and attention was turned to reaming the acetabulum.  Sequential reaming was performed under fluoroscopic guidance. We reamed to a size 53 mm, and then impacted the acetabular shell. A 25 mm cancellous screw was placed through the shell for added fixation.  The liner was then placed after irrigation and attention turned to the femur.  After placing the femoral hook, the leg was taken to externally rotated, extended and adducted position taking care to perform soft tissue releases to allow for  adequate mobilization of the femur. Soft tissue was cleared from the shoulder of the greater trochanter and the hook elevator used to improve exposure of the proximal femur. Sequential broaching performed up to a size 7. Trial neck and head were placed. The leg was brought back up to neutral and the construct reduced.  Antibiotic irrigation was placed in the surgical wound.  The position and sizing of components, offset and leg lengths were checked using fluoroscopy. Stability of the construct was checked in extension and external rotation without any subluxation or impingement of prosthesis. We dislocated the prosthesis, dropped the leg back into position, removed trial components, and irrigated copiously. The final stem and head was then placed, the leg brought back up, the system reduced and fluoroscopy used to verify positioning.  We irrigated, obtained hemostasis and closed the capsule using #2 ethibond suture.  One gram of vancomycin powder was placed in the surgical bed.   One gram of topical tranexamic acid was injected into the joint.  The fascia was closed with #1 vicryl plus, the deep fat layer was closed with 0 vicryl, the subcutaneous layers closed with 2.0 Vicryl Plus and the skin closed with 2.0 nylon and dermabond. A sterile dressing was applied. The patient was awakened in the operating room and taken to recovery in stable condition.  All sponge, needle, and instrument counts were correct at the end of the case.   Tawanna Cooler, my PA, was a medical necessity for opening, closing, limb positioning, retracting, exposing, and overall facilitation and timely completion of the surgery.  Position: supine  Complications: see description of procedure.  Time Out: performed   Drains/Packing: none  Estimated blood loss:  see anesthesia record  Returned to Recovery Room: in good condition.   Antibiotics: yes   Mechanical VTE (DVT) Prophylaxis: sequential compression devices, TED thigh-high   Chemical VTE (DVT) Prophylaxis: aspirin   Fluid Replacement: see anesthesia record  Specimens Removed: 1 to pathology   Sponge and Instrument Count Correct? yes   PACU: portable radiograph - low AP   Plan/RTC: Return in 2 weeks for staple removal. Weight Bearing/Load Lower Extremity: full  Hip precautions: none Suture Removal: 2 weeks   N. Eduard Roux, MD [facility] 8:49 AM   Implant Name Type Inv. Item Serial No. Manufacturer Lot No. LRB No. Used Action  ACETAB CUP W/GRIPTION 54 - [id number] Plate ACETAB CUP W/GRIPTION 54  [employer] [id number] Left 1 Implanted  LINER NEUTRAL 54X36MM PLUS 4 - [id number] Hips LINER NEUTRAL 54X36MM PLUS 4  [employer] [id number] Left 1 Implanted  SCREW 6.5MMX25MM - [id number] Screw SCREW 6.5MMX25MM  [employer] [id number] Left 1 Implanted  STEM FEM ACTIS STD SZ7 - [id number] Nail STEM FEM ACTIS STD SZ7  [employer] [id number] Left 1 Implanted  SROM M HEAD 36MM PLUS 1.5 - [id number] Hips SROM M HEAD 36MM PLUS 1.5  [employer] [id number] Left 1 Implanted

## 2021-08-30 NOTE — Evaluation (Signed)
Physical Therapy Evaluation Patient Details Name: Trevor Schmidt MRN: [id number] DOB: [birth year] Today's Date: 08/30/2021  History of Present Illness  Pt is an 81 y/o male s/p L THA, direct anterior. PMH includes bladder cancer, HTN, and sleep apnea.  Clinical Impression  Pt admitted secondary to problem above with deficits below. Pt requiring min guard A for mobility tasks using RW this session. Tolerated ambulation well. Reports wife will be able to assist as needed at d/c. Will continue to follow acutely.        Recommendations for follow up therapy are one component of a multi-disciplinary discharge planning process, led by the attending physician.  Recommendations may be updated based on patient status, additional functional criteria and insurance authorization.  Follow Up Recommendations Follow physician's recommendations for discharge plan and follow up therapies      Assistance Recommended at Discharge Intermittent Supervision/Assistance  Patient can return home with the following  Assistance with cooking/housework;Assist for transportation    Equipment Recommendations Rolling walker (2 wheels);BSC/3in1  Recommendations for Other Services       Functional Status Assessment Patient has had a recent decline in their functional status and demonstrates the ability to make significant improvements in function in a reasonable and predictable amount of time.     Precautions / Restrictions Precautions Precautions: Fall Restrictions Weight Bearing Restrictions: Yes LLE Weight Bearing: Weight bearing as tolerated      Mobility  Bed Mobility Overal bed mobility: Needs Assistance Bed Mobility: Supine to Sit     Supine to sit: Min guard     General bed mobility comments: Min guard for safety.    Transfers Overall transfer level: Needs assistance Equipment used: Rolling walker (2 wheels) Transfers: Sit to/from Stand Sit to Stand: Min guard           General  transfer comment: Min guard for safety. Cues for hand placement.    Ambulation/Gait Ambulation/Gait assistance: Min guard Gait Distance (Feet): 30 Feet Assistive device: Rolling walker (2 wheels) Gait Pattern/deviations: Step-to pattern, Decreased step length - right, Decreased step length - left, Decreased weight shift to left, Antalgic Gait velocity: Decreased     General Gait Details: Slow, mildly antalgic gait. Min guard for safety. Mild shakiness observed.  Stairs            Wheelchair Mobility    Modified Rankin (Stroke Patients Only)       Balance Overall balance assessment: Needs assistance Sitting-balance support: No upper extremity supported, Feet supported Sitting balance-Leahy Scale: Good     Standing balance support: Bilateral upper extremity supported Standing balance-Leahy Scale: Poor Standing balance comment: Reliant on BUE support                             Pertinent Vitals/Pain Pain Assessment Pain Assessment: Faces Faces Pain Scale: Hurts little more Pain Location: L hip Pain Descriptors / Indicators: Grimacing, Guarding Pain Intervention(s): Limited activity within patient's tolerance, Monitored during session, Repositioned    Home Living Family/patient expects to be discharged to:: Private residence Living Arrangements: Spouse/significant other Available Help at Discharge: Family Type of Home: House Home Access: Stairs to enter Entrance Stairs-Rails: Can reach both;Left;Right Entrance Stairs-Number of Steps: 3   Home Layout: One level Home Equipment: Cane - single point      Prior Function Prior Level of Function : Independent/Modified Independent  Hand Dominance        Extremity/Trunk Assessment   Upper Extremity Assessment Upper Extremity Assessment: Overall WFL for tasks assessed    Lower Extremity Assessment Lower Extremity Assessment: LLE deficits/detail LLE Deficits / Details:  Deficits consistent with post op pain and weakness.    Cervical / Trunk Assessment Cervical / Trunk Assessment: Normal  Communication   Communication: HOH  Cognition Arousal/Alertness: Awake/alert Behavior During Therapy: WFL for tasks assessed/performed Overall Cognitive Status: Within Functional Limits for tasks assessed                                          General Comments      Exercises     Assessment/Plan    PT Assessment Patient needs continued PT services  PT Problem List Decreased strength;Decreased range of motion;Decreased activity tolerance;Decreased balance;Decreased mobility;Decreased knowledge of use of DME;Decreased knowledge of precautions       PT Treatment Interventions DME instruction;Gait training;Stair training;Functional mobility training;Therapeutic activities;Therapeutic exercise;Balance training;Patient/family education    PT Goals (Current goals can be found in the Care Plan section)  Acute Rehab PT Goals Patient Stated Goal: to go home PT Goal Formulation: With patient Time For Goal Achievement: 09/13/21 Potential to Achieve Goals: Good    Frequency 7X/week     Co-evaluation               AM-PAC PT "6 Clicks" Mobility  Outcome Measure Help needed turning from your back to your side while in a flat bed without using bedrails?: None Help needed moving from lying on your back to sitting on the side of a flat bed without using bedrails?: A Little Help needed moving to and from a bed to a chair (including a wheelchair)?: A Little Help needed standing up from a chair using your arms (e.g., wheelchair or bedside chair)?: A Little Help needed to walk in hospital room?: A Little Help needed climbing 3-5 steps with a railing? : A Little 6 Click Score: 19    End of Session Equipment Utilized During Treatment: Gait belt Activity Tolerance: Patient tolerated treatment well Patient left: in chair;with call bell/phone within  reach Nurse Communication: Mobility status PT Visit Diagnosis: Other abnormalities of gait and mobility (R26.89);Difficulty in walking, not elsewhere classified (R26.2)    Time: 3291-9166 PT Time Calculation (min) (ACUTE ONLY): 17 min   Charges:   PT Evaluation $PT Eval Low Complexity: 1 Low          Reuel Derby, PT, DPT  Acute Rehabilitation Services  Office: [phone]   Rudean Hitt 08/30/2021, 4:10 PM

## 2021-08-30 NOTE — H&P (Signed)
PREOPERATIVE H&P  Chief Complaint: left hip degenerative joint disease  HPI: Trevor Schmidt is a 81 y.o. male who presents for surgical treatment of left hip degenerative joint disease.  He denies any changes in medical history.  Past Medical History:  Diagnosis Date   Acute bronchitis 02/04/2015   Arthritis    Cancer ([place])    bladder   Confusion    Depression    Dysrhythmia    LBBB   Edema    feet/ankles   GERD (gastroesophageal reflux disease)    HOH (hard of hearing)    aids   Hypertension    Kidney stones    MVP (mitral valve prolapse)    Prostate disease    Sleep apnea    CPAP   Past Surgical History:  Procedure Laterality Date   BACK SURGERY     BLADDER SURGERY     cancer   CARDIAC CATHETERIZATION N/A 12/09/2014   Procedure: Left Heart Cath and Coronary Angiography;  Surgeon: Troy Sine, MD;  Location: [facility];  Service: Cardiovascular;  Laterality: N/A;   CATARACT EXTRACTION W/PHACO Right 09/08/2014   Procedure: CATARACT EXTRACTION PHACO AND INTRAOCULAR LENS PLACEMENT (IOC);  Surgeon: Estill Cotta, MD;  Location: [facility];  Service: Ophthalmology;  Laterality: Right;  US:1:06.3 AP%:23.3 CDE:24.97 Fluid lot# [id number]   CEREBRAL ANEURYSM REPAIR  2001   coil and shunt placement   CYSTOSCOPY     litho x 2   KNEE ARTHROSCOPY Right    TONSILLECTOMY     tonsils and adenoids removed as a child   VENTRICULOPERITONEAL SHUNT  2001   WRIST SURGERY Bilateral    ctr   Social History   Socioeconomic History   Marital status: Married    Spouse name: Not on file   Number of children: 4   Years of education: 12   Highest education level: Not on file  Occupational History   Occupation: Retired  Tobacco Use   Smoking status: Former    Types: Cigarettes    Quit date: 2003    Years since quitting: 20.5   Smokeless tobacco: Former    Types: Snuff    Quit date: 2022   Tobacco comments:    Chews [place]  Vaping Use   Vaping Use:  Never used  Substance and Sexual Activity   Alcohol use: No   Drug use: No   Sexual activity: Not on file  Other Topics Concern   Not on file  Social History Narrative   Fun: Fish, woodworking   Social Determinants of Radio broadcast assistant Strain: Not on file  Food Insecurity: Not on file  Transportation Needs: Not on file  Physical Activity: Not on file  Stress: Not on file  Social Connections: Not on file   Family History  Problem Relation Age of Onset   Heart disease Mother    Prostate cancer Father    Healthy Maternal Grandmother    Healthy Maternal Grandfather    Healthy Paternal Grandmother    Healthy Paternal Grandfather    Allergies  Allergen Reactions   Buspirone Diarrhea   Ceftriaxone Rash   Prior to Admission medications   Medication Sig Start Date End Date Taking? Authorizing Provider  amLODipine (NORVASC) 5 MG tablet TAKE 1 TABLET BY MOUTH DAILY 11/30/20  Yes Troy Sine, MD  atorvastatin (LIPITOR) 40 MG tablet Take 40 mg by mouth every evening.   Yes [provider]  Calcium Carb-Cholecalciferol (CALCIUM 600-D  PO) Take 1 tablet by mouth 2 (two) times daily.   Yes [provider]  carvedilol (COREG) 6.25 MG tablet TAKE ONE TABLET BY MOUTH TWICE A DAY 08/31/20  Yes Troy Sine, MD  citalopram (CELEXA) 40 MG tablet TAKE 1 TABLET (40 MG TOTAL) BY MOUTH DAILY. 03/12/15  Yes Golden Circle, FNP  ENTRESTO 97-103 MG TAKE ONE TABLET BY MOUTH TWICE A DAY 04/22/21  Yes Troy Sine, MD  finasteride (PROSCAR) 5 MG tablet Take 5 mg by mouth daily.   Yes [provider]  magnesium oxide (MAG-OX) 400 MG tablet Take 400 mg by mouth every evening.   Yes [provider]  Multiple Vitamins-Minerals (OCUVITE-LUTEIN PO) Take 2 capsules by mouth daily.   Yes [provider]  Polyethyl Glycol-Propyl Glycol (SYSTANE OP) Place 1 drop into both eyes daily.   Yes [provider]  polyethylene glycol powder (MIRALAX)  17 GM/SCOOP powder Take 1 Container by mouth daily as needed for mild constipation.   Yes [provider]  spironolactone (ALDACTONE) 25 MG tablet Take 0.5 tablets (12.5 mg total) by mouth daily. 04/29/21  Yes Troy Sine, MD  vitamin B-12 (CYANOCOBALAMIN) 500 MCG tablet Take 500 mcg by mouth every Sunday.   Yes [provider]  zolpidem (AMBIEN) 5 MG tablet Take 5 mg by mouth at bedtime. 07/15/20  Yes [provider]  aspirin EC 81 MG tablet Take 1 tablet (81 mg total) by mouth in the morning and at bedtime. To be taken after surgery to prevent  blood clots.  Swallow whole. 08/23/21 08/23/22  Aundra Dubin, PA-C  docusate sodium (COLACE) 100 MG capsule Take 1 capsule (100 mg total) by mouth daily as needed. 08/23/21 08/23/22  Aundra Dubin, PA-C  methocarbamol (ROBAXIN-750) 750 MG tablet Take 1 tablet (750 mg total) by mouth 2 (two) times daily as needed for muscle spasms. To be taken after surgery 08/23/21   Aundra Dubin, PA-C  ondansetron (ZOFRAN) 4 MG tablet Take 1 tablet (4 mg total) by mouth every 8 (eight) hours as needed for nausea or vomiting. 08/23/21   Aundra Dubin, PA-C  oxyCODONE-acetaminophen (PERCOCET) 5-325 MG tablet Take 1-2 tablets by mouth every 6 (six) hours as needed. To be taken after surgery 08/23/21   Aundra Dubin, PA-C  Potassium Chloride ER 20 MEQ TBCR TAKE ONE TABLET BY MOUTH DAILY 08/18/21   Troy Sine, MD     Positive ROS: All other systems have been reviewed and were otherwise negative with the exception of those mentioned in the HPI and as above.  Physical Exam: General: Alert, no acute distress Cardiovascular: No pedal edema Respiratory: No cyanosis, no use of accessory musculature GI: abdomen soft Skin: No lesions in the area of chief complaint Neurologic: Sensation intact distally Psychiatric: Patient is competent for consent with normal mood and affect Lymphatic: no lymphedema  MUSCULOSKELETAL: exam  stable  Assessment: left hip degenerative joint disease  Plan: Plan for Procedure(s): LEFT TOTAL HIP ARTHROPLASTY ANTERIOR APPROACH  The risks benefits and alternatives were discussed with the patient including but not limited to the risks of nonoperative treatment, versus surgical intervention including infection, bleeding, nerve injury,  blood clots, cardiopulmonary complications, morbidity, mortality, among others, and they were willing to proceed.   Eduard Roux, MD 08/30/2021 6:01 AM

## 2021-08-30 NOTE — Discharge Instructions (Signed)

## 2021-08-30 NOTE — Anesthesia Procedure Notes (Signed)
Procedure Name: Intubation Date/Time: 08/30/2021 7:30 AM  Performed by: Thelma Comp, CRNAPre-anesthesia Checklist: Patient identified, Emergency Drugs available, Suction available and Patient being monitored Patient Re-evaluated:Patient Re-evaluated prior to induction Oxygen Delivery Method: Circle System Utilized Preoxygenation: Pre-oxygenation with 100% oxygen Induction Type: IV induction Ventilation: Mask ventilation without difficulty Laryngoscope Size: Mac and 4 Grade View: Grade II Tube type: Oral Tube size: 7.5 mm Number of attempts: 1 Airway Equipment and Method: Stylet Placement Confirmation: ETT inserted through vocal cords under direct vision, positive ETCO2 and breath sounds checked- equal and bilateral Secured at: 21 cm Tube secured with: Tape Dental Injury: Teeth and Oropharynx as per pre-operative assessment

## 2021-08-30 NOTE — Transfer of Care (Signed)
Immediate Anesthesia Transfer of Care Note  Patient: Trevor Schmidt  Procedure(s) Performed: LEFT TOTAL HIP ARTHROPLASTY ANTERIOR APPROACH (Left: Hip)  Patient Location: PACU  Anesthesia Type:General  Level of Consciousness: drowsy and patient cooperative  Airway & Oxygen Therapy: Patient Spontanous Breathing  Post-op Assessment: Report given to RN and Post -op Vital signs reviewed and stable  Post vital signs: Reviewed and stable  Last Vitals:  Vitals Value Taken Time  BP 153/76   Temp    Pulse 55   Resp 10   SpO2 96     Last Pain:  Vitals:   08/30/21 0616  TempSrc:   PainSc: 0-No pain         Complications: No notable events documented.

## 2021-08-30 NOTE — Anesthesia Postprocedure Evaluation (Signed)
Anesthesia Post Note  Patient: Trevor Schmidt  Procedure(s) Performed: LEFT TOTAL HIP ARTHROPLASTY ANTERIOR APPROACH (Left: Hip)     Patient location during evaluation: PACU Anesthesia Type: General Level of consciousness: awake and alert Pain management: pain level controlled Vital Signs Assessment: post-procedure vital signs reviewed and stable Respiratory status: spontaneous breathing, nonlabored ventilation and respiratory function stable Cardiovascular status: blood pressure returned to baseline and stable Postop Assessment: no apparent nausea or vomiting Anesthetic complications: no   No notable events documented.  Last Vitals:  Vitals:   08/30/21 1115 08/30/21 1149  BP: 106/64 108/70  Pulse: (!) 55 (!) 59  Resp: 18 18  Temp:  36.9 C  SpO2: 92% 92%    Last Pain:  Vitals:   08/30/21 1115  TempSrc:   PainSc: [name]

## 2021-08-31 DIAGNOSIS — M1612 Unilateral primary osteoarthritis, left hip: Secondary | ICD-10-CM | POA: Diagnosis not present

## 2021-08-31 LAB — CBC
HCT: 31.2 % — ABNORMAL LOW (ref 39.0–52.0)
Hemoglobin: 10.9 g/dL — ABNORMAL LOW (ref 13.0–17.0)
MCH: 31.5 pg (ref 26.0–34.0)
MCHC: 34.9 g/dL (ref 30.0–36.0)
MCV: 90.2 fL (ref 80.0–100.0)
Platelets: 148 10*3/uL — ABNORMAL LOW (ref 150–400)
RBC: 3.46 MIL/uL — ABNORMAL LOW (ref 4.22–5.81)
RDW: 12.6 % (ref 11.5–15.5)
WBC: 17.3 10*3/uL — ABNORMAL HIGH (ref 4.0–10.5)
nRBC: 0 % (ref 0.0–0.2)

## 2021-08-31 MED ORDER — SODIUM CHLORIDE 0.9 % IV BOLUS
500.0000 mL | Freq: Once | INTRAVENOUS | Status: AC
  Administered 2021-08-31: 500 mL via INTRAVENOUS

## 2021-08-31 NOTE — Progress Notes (Signed)
Talked with Tiney Rouge, PA, earlier i.e. hr of 83.  Pt received his bolus of fluid and hr is 58.  She advised to call his PCP and make him aware of the heart and maybe his meds may need to be reevaluated,  pt and wife understood the request.  Pt says he feel good and is not dizzy when getting OOB.

## 2021-08-31 NOTE — Progress Notes (Signed)
Physical Therapy Treatment Patient Details Name: Trevor Schmidt MRN: [id number] DOB: [birth year] Today's Date: 08/31/2021   History of Present Illness Pt is an 81 y/o male s/p L THA, direct anterior. PMH includes bladder cancer, HTN, and sleep apnea.    PT Comments    Pt making excellent progress with functional mobility. He tolerated hallway ambulation with use of RW and stair training this session as well. PT will plan for a second session today prior to pt d/c'ing home with family support.    Recommendations for follow up therapy are one component of a multi-disciplinary discharge planning process, led by the attending physician.  Recommendations may be updated based on patient status, additional functional criteria and insurance authorization.  Follow Up Recommendations  No PT follow up     Assistance Recommended at Discharge Intermittent Supervision/Assistance  Patient can return home with the following Assistance with cooking/housework;Assist for transportation   Equipment Recommendations  Rolling walker (2 wheels);BSC/3in1    Recommendations for Other Services       Precautions / Restrictions Precautions Precautions: Fall Restrictions Weight Bearing Restrictions: Yes LLE Weight Bearing: Weight bearing as tolerated     Mobility  Bed Mobility Overal bed mobility: Needs Assistance Bed Mobility: Supine to Sit     Supine to sit: Supervision          Transfers Overall transfer level: Needs assistance Equipment used: Rolling walker (2 wheels) Transfers: Sit to/from Stand Sit to Stand: Min guard           General transfer comment: Min guard for safety. Cues for hand placement.    Ambulation/Gait Ambulation/Gait assistance: Min guard Gait Distance (Feet): 100 Feet Assistive device: Rolling walker (2 wheels) Gait Pattern/deviations: Step-through pattern, Decreased stride length Gait velocity: Decreased     General Gait Details: pt with slow, steady gait  with safe use of RW, min guard and progressing to close supervision   Stairs Stairs: Yes Stairs assistance: Min guard Stair Management: Two rails, Step to pattern, Forwards Number of Stairs: 3 General stair comments: cueing for technique and sequencing   Wheelchair Mobility    Modified Rankin (Stroke Patients Only)       Balance Overall balance assessment: Needs assistance Sitting-balance support: No upper extremity supported, Feet supported Sitting balance-Leahy Scale: Good     Standing balance support: No upper extremity supported, During functional activity Standing balance-Leahy Scale: Fair                              Cognition Arousal/Alertness: Awake/alert Behavior During Therapy: WFL for tasks assessed/performed Overall Cognitive Status: Within Functional Limits for tasks assessed                                          Exercises      General Comments        Pertinent Vitals/Pain Pain Assessment Pain Assessment: 0-10 Pain Score: 3  Pain Location: L hip Pain Descriptors / Indicators: Burning Pain Intervention(s): Monitored during session, Repositioned    Home Living                          Prior Function            PT Goals (current goals can now be found in the care plan section) Acute Rehab PT Goals  PT Goal Formulation: With patient Time For Goal Achievement: 09/13/21 Potential to Achieve Goals: Good Progress towards PT goals: Progressing toward goals    Frequency    7X/week      PT Plan Current plan remains appropriate    Co-evaluation              AM-PAC PT "6 Clicks" Mobility   Outcome Measure  Help needed turning from your back to your side while in a flat bed without using bedrails?: None Help needed moving from lying on your back to sitting on the side of a flat bed without using bedrails?: None Help needed moving to and from a bed to a chair (including a wheelchair)?: A  Little Help needed standing up from a chair using your arms (e.g., wheelchair or bedside chair)?: A Little Help needed to walk in hospital room?: A Little Help needed climbing 3-5 steps with a railing? : A Little 6 Click Score: 20    End of Session Equipment Utilized During Treatment: Gait belt Activity Tolerance: Patient tolerated treatment well Patient left: in chair;with call bell/phone within reach Nurse Communication: Mobility status PT Visit Diagnosis: Other abnormalities of gait and mobility (R26.89);Difficulty in walking, not elsewhere classified (R26.2)     Time: 7371-0626 PT Time Calculation (min) (ACUTE ONLY): 18 min  Charges:  $Gait Training: 8-22 mins                     Anastasio Champion, DPT  Acute Rehabilitation Services Office [place] 08/31/2021, 10:27 AM

## 2021-08-31 NOTE — Progress Notes (Signed)
Physical Therapy Treatment Patient Details Name: Trevor Schmidt MRN: [id number] DOB: 1940-10-04 Today's Date: 08/31/2021   History of Present Illness Pt is an 81 y/o male s/p L THA, direct anterior. PMH includes bladder cancer, HTN, and sleep apnea.    PT Comments    Pt continuing to make steady progress with functional mobility. Focus of session was on standing balance and ADL (dressing) as pt was preparing to d/c home soon with his wife. PT expressed the importance of continuing to use the RW at home for safety and stability, as well as continuing to ambulate regularly throughout the day. Pt and wife in agreement with plan.    Recommendations for follow up therapy are one component of a multi-disciplinary discharge planning process, led by the attending physician.  Recommendations may be updated based on patient status, additional functional criteria and insurance authorization.  Follow Up Recommendations  No PT follow up     Assistance Recommended at Discharge Intermittent Supervision/Assistance  Patient can return home with the following Assistance with cooking/housework;Assist for transportation   Equipment Recommendations  Rolling walker (2 wheels);BSC/3in1    Recommendations for Other Services       Precautions / Restrictions Precautions Precautions: Fall Restrictions Weight Bearing Restrictions: Yes LLE Weight Bearing: Weight bearing as tolerated     Mobility  Bed Mobility Overal bed mobility: Needs Assistance Bed Mobility: Supine to Sit, Sit to Supine     Supine to sit: Supervision Sit to supine: Supervision        Transfers Overall transfer level: Needs assistance Equipment used: Rolling walker (2 wheels) Transfers: Sit to/from Stand Sit to Stand: Min guard           General transfer comment: Min guard for safety. Cues for hand placement.    Ambulation/Gait Ambulation/Gait assistance: Min guard Gait Distance (Feet): 100 Feet Assistive device:  Rolling walker (2 wheels) Gait Pattern/deviations: Step-through pattern, Decreased stride length Gait velocity: Decreased     General Gait Details: pt with slow, steady gait with safe use of RW, min guard and progressing to close supervision   Stairs Stairs: Yes Stairs assistance: Min guard Stair Management: Two rails, Step to pattern, Forwards Number of Stairs: 3 General stair comments: cueing for technique and sequencing   Wheelchair Mobility    Modified Rankin (Stroke Patients Only)       Balance Overall balance assessment: Needs assistance Sitting-balance support: No upper extremity supported, Feet supported Sitting balance-Leahy Scale: Good     Standing balance support: No upper extremity supported, During functional activity Standing balance-Leahy Scale: Fair                              Cognition Arousal/Alertness: Awake/alert Behavior During Therapy: WFL for tasks assessed/performed Overall Cognitive Status: Within Functional Limits for tasks assessed                                          Exercises      General Comments        Pertinent Vitals/Pain Pain Assessment Pain Assessment: Faces Pain Score: 3  Faces Pain Scale: Hurts little more Pain Location: L hip Pain Descriptors / Indicators: Burning Pain Intervention(s): Monitored during session, Repositioned, Patient requesting pain meds-RN notified    Home Living  Prior Function            PT Goals (current goals can now be found in the care plan section) Acute Rehab PT Goals PT Goal Formulation: With patient Time For Goal Achievement: 09/13/21 Potential to Achieve Goals: Good Progress towards PT goals: Progressing toward goals    Frequency    7X/week      PT Plan Current plan remains appropriate    Co-evaluation              AM-PAC PT "6 Clicks" Mobility   Outcome Measure  Help needed turning from your  back to your side while in a flat bed without using bedrails?: None Help needed moving from lying on your back to sitting on the side of a flat bed without using bedrails?: None Help needed moving to and from a bed to a chair (including a wheelchair)?: A Little Help needed standing up from a chair using your arms (e.g., wheelchair or bedside chair)?: A Little Help needed to walk in hospital room?: A Little Help needed climbing 3-5 steps with a railing? : A Little 6 Click Score: 20    End of Session Equipment Utilized During Treatment: Gait belt Activity Tolerance: Patient tolerated treatment well Patient left: in bed;with call bell/phone within reach;with family/visitor present Nurse Communication: Mobility status;Patient requests pain meds PT Visit Diagnosis: Other abnormalities of gait and mobility (R26.89);Difficulty in walking, not elsewhere classified (R26.2)     Time: 2633-3545 PT Time Calculation (min) (ACUTE ONLY): 20 min  Charges:  $Therapeutic Activity: 8-22 mins                     Anastasio Champion, DPT  Acute Rehabilitation Services Office [place] 08/31/2021, 12:28 PM

## 2021-08-31 NOTE — Progress Notes (Signed)
Subjective: 1 Day Post-Op Procedure(s) (LRB): LEFT TOTAL HIP ARTHROPLASTY ANTERIOR APPROACH (Left) Patient reports pain as mild.    Objective: Vital signs in last 24 hours: Temp:  [97.9 F (36.6 C)-98.5 F (36.9 C)] 98 F (36.7 C) (07/31 2300) Pulse Rate:  [53-62] 57 (08/01 0502) Resp:  [13-21] 15 (08/01 0502) BP: (106-153)/(58-76) 117/59 (08/01 0502) SpO2:  [91 %-96 %] 94 % (08/01 0502)  Intake/Output from previous day: 07/31 0701 - 08/01 0700 In: 1020 [P.O.:170; I.V.:600; IV Piggyback:100] Out: 500 [Urine:300; Blood:200] Intake/Output this shift: No intake/output data recorded.  Recent Labs    08/31/21 0548  HGB 10.9*   Recent Labs    08/31/21 0548  WBC 17.3*  RBC 3.46*  HCT 31.2*  PLT 148*   No results for input(s): "NA", "K", "CL", "CO2", "BUN", "CREATININE", "GLUCOSE", "CALCIUM" in the last 72 hours. No results for input(s): "LABPT", "INR" in the last 72 hours.  Neurologically intact Neurovascular intact Sensation intact distally Intact pulses distally Dorsiflexion/Plantar flexion intact Incision: dressing C/D/I No cellulitis present Compartment soft   Assessment/Plan: 1 Day Post-Op Procedure(s) (LRB): LEFT TOTAL HIP ARTHROPLASTY ANTERIOR APPROACH (Left) Advance diet Up with therapy D/C IV fluids Discharge home with home health WBAT LLE ABLA- mild and stable F/u with Dr. Erlinda Hong 2 weeks post-op      Trevor Schmidt 08/31/2021, 8:05 AM

## 2021-08-31 NOTE — Discharge Summary (Signed)
Patient ID: Trevor Schmidt MRN: [id number] DOB/AGE: [birth year] 81 y.o.  Admit date: 08/30/2021 Discharge date: 08/31/2021  Admission Diagnoses:  Principal Problem:   Primary osteoarthritis of left hip Active Problems:   Status post total replacement of left hip   Discharge Diagnoses:  Same  Past Medical History:  Diagnosis Date   Acute bronchitis 02/04/2015   Arthritis    Cancer (HCC)    bladder   Confusion    Depression    Dysrhythmia    LBBB   Edema    feet/ankles   GERD (gastroesophageal reflux disease)    HOH (hard of hearing)    aids   Hypertension    Kidney stones    MVP (mitral valve prolapse)    Prostate disease    Sleep apnea    CPAP    Surgeries: Procedure(s): LEFT TOTAL HIP ARTHROPLASTY ANTERIOR APPROACH on 08/30/2021   Consultants:   Discharged Condition: Improved  Hospital Course: Trevor Schmidt is an 81 y.o. male who was admitted 08/30/2021 for operative treatment ofPrimary osteoarthritis of left hip. Patient has severe unremitting pain that affects sleep, daily activities, and work/hobbies. After pre-op clearance the patient was taken to the operating room on 08/30/2021 and underwent  Procedure(s): LEFT TOTAL HIP ARTHROPLASTY ANTERIOR APPROACH.    Patient was given perioperative antibiotics:  Anti-infectives (From admission, onward)    Start     Dose/Rate Route Frequency Ordered Stop   08/30/21 1300  ceFAZolin (ANCEF) IVPB 2g/100 mL premix        2 g 200 mL/hr over 30 Minutes Intravenous Every 6 hours 08/30/21 1208 08/30/21 1930   08/30/21 0815  vancomycin (VANCOCIN) powder  Status:  Discontinued          As needed 08/30/21 0816 08/30/21 0910   08/30/21 0641  ceFAZolin (ANCEF) 2-4 GM/100ML-% IVPB       Note to Pharmacy: Alba Cory B: cabinet override      08/30/21 0641 08/30/21 0745   08/30/21 0600  ceFAZolin (ANCEF) IVPB 2g/100 mL premix        2 g 200 mL/hr over 30 Minutes Intravenous On call to O.R. 08/30/21 0540 08/30/21 0740         Patient was given sequential compression devices, early ambulation, and chemoprophylaxis to prevent DVT.  Patient benefited maximally from hospital stay and there were no complications.    Recent vital signs: Patient Vitals for the past 24 hrs:  BP Temp Temp src Pulse Resp SpO2  08/31/21 0502 (!) 117/59 -- -- (!) 57 15 94 %  08/30/21 2300 111/66 98 F (36.7 C) Oral 62 18 94 %  08/30/21 1149 108/70 98.5 F (36.9 C) -- (!) 59 18 92 %  08/30/21 1115 106/64 -- -- (!) 55 18 92 %  08/30/21 1045 (!) 113/58 97.9 F (36.6 C) -- (!) 54 18 94 %  08/30/21 1015 117/67 98.3 F (36.8 C) -- (!) 53 17 96 %  08/30/21 1000 110/72 -- -- (!) 55 15 94 %  08/30/21 0945 118/60 -- -- (!) 53 13 92 %  08/30/21 0930 125/73 -- -- (!) 57 (!) 21 91 %  08/30/21 0915 (!) 153/76 98.3 F (36.8 C) -- (!) 56 14 92 %     Recent laboratory studies:  Recent Labs    08/31/21 0548  WBC 17.3*  HGB 10.9*  HCT 31.2*  PLT 148*     Discharge Medications:   Allergies as of 08/31/2021       Reactions  Buspirone Diarrhea   Ceftriaxone Rash        Medication List     TAKE these medications    amLODipine 5 MG tablet Commonly known as: NORVASC TAKE 1 TABLET BY MOUTH DAILY   aspirin EC 81 MG tablet Take 1 tablet (81 mg total) by mouth in the morning and at bedtime. To be taken after surgery to prevent  blood clots.  Swallow whole.   atorvastatin 40 MG tablet Commonly known as: LIPITOR Take 40 mg by mouth every evening.   CALCIUM 600-D PO Take 1 tablet by mouth 2 (two) times daily.   carvedilol 6.25 MG tablet Commonly known as: COREG TAKE ONE TABLET BY MOUTH TWICE A DAY   citalopram 40 MG tablet Commonly known as: CELEXA TAKE 1 TABLET (40 MG TOTAL) BY MOUTH DAILY.   cyanocobalamin 500 MCG tablet Commonly known as: VITAMIN B12 Take 500 mcg by mouth every 'Sunday.   docusate sodium 100 MG capsule Commonly known as: Colace Take 1 capsule (100 mg total) by mouth daily as needed.   Entresto  97-103 MG Generic drug: sacubitril-valsartan TAKE ONE TABLET BY MOUTH TWICE A DAY   finasteride 5 MG tablet Commonly known as: PROSCAR Take 5 mg by mouth daily.   magnesium oxide 400 MG tablet Commonly known as: MAG-OX Take 400 mg by mouth every evening.   methocarbamol 750 MG tablet Commonly known as: Robaxin-750 Take 1 tablet (750 mg total) by mouth 2 (two) times daily as needed for muscle spasms. To be taken after surgery   MiraLax 17 GM/SCOOP powder Generic drug: polyethylene glycol powder Take 1 Container by mouth daily as needed for mild constipation.   OCUVITE-LUTEIN PO Take 2 capsules by mouth daily.   ondansetron 4 MG tablet Commonly known as: Zofran Take 1 tablet (4 mg total) by mouth every 8 (eight) hours as needed for nausea or vomiting.   oxyCODONE-acetaminophen 5-325 MG tablet Commonly known as: Percocet Take 1-2 tablets by mouth every 6 (six) hours as needed. To be taken after surgery   Potassium Chloride ER 20 MEQ Tbcr TAKE ONE TABLET BY MOUTH DAILY   spironolactone 25 MG tablet Commonly known as: ALDACTONE Take 0.5 tablets (12.5 mg total) by mouth daily.   SYSTANE OP Place 1 drop into both eyes daily.   zolpidem 5 MG tablet Commonly known as: AMBIEN Take 5 mg by mouth at bedtime.               Durable Medical Equipment  (From admission, onward)           Start     Ordered   08/30/21 1209  DME Walker rolling  Once       Question:  Patient needs a walker to treat with the following condition  Answer:  History of hip replacement   08/30/21 1208   08/30/21 1209  DME 3 n 1  Once        07'$ /31/23 1208   08/30/21 1209  DME Bedside commode  Once       Question:  Patient needs a bedside commode to treat with the following condition  Answer:  History of hip replacement   08/30/21 1208            Diagnostic Studies: DG Pelvis Portable  Result Date: 08/30/2021 CLINICAL DATA:  Postop joint replacement EXAM: PORTABLE PELVIS 1 VIEWS  COMPARISON:  Radiograph dated September 12, 2012 FINDINGS: Interval postsurgical changes from left total hip arthroplasty. Arthroplasty components appear in their  expected alignment. No periprosthetic fracture is identified. Expected postoperative changes within the overlying soft tissues. IMPRESSION: Postsurgical changes from left total hip arthroplasty. Electronically Signed   By: Yetta Glassman M.D.   On: 08/30/2021 09:50   DG HIP UNILAT WITH PELVIS 1V LEFT  Result Date: 08/30/2021 CLINICAL DATA:  Anterior approach total left hip arthroplasty. EXAM: DG HIP (WITH OR WITHOUT PELVIS) 1V*L* COMPARISON:  Pelvis and left hip radiographs 07/09/2021 FINDINGS: Images were performed intraoperatively without the presence of a radiologist. The patient is undergoing new total left hip arthroplasty. No hardware complication is seen. Total fluoroscopy images: 4 Total fluoroscopy time: 22 seconds Total dose: Radiation Exposure Index (as provided by the fluoroscopic device): 3.1 MGy air Kerma Please see intraoperative findings for further detail. IMPRESSION: Intraoperative fluoroscopy for total left hip arthroplasty. Electronically Signed   By: Yvonne Kendall M.D.   On: 08/30/2021 09:07   DG C-Arm 1-60 Min-No Report  Result Date: 08/30/2021 Fluoroscopy was utilized by the requesting physician.  No radiographic interpretation.   DG C-Arm 1-60 Min-No Report  Result Date: 08/30/2021 Fluoroscopy was utilized by the requesting physician.  No radiographic interpretation.    Disposition: Discharge disposition: 01-Home or Self Care          Follow-up Information     Leandrew Koyanagi, MD. Schedule an appointment as soon as possible for a visit in 2 week(s).   Specialty: Orthopedic Surgery Contact information: [address] [place] [address] [phone]                  Signed: Aundra Dubin 08/31/2021, 8:06 AM

## 2021-08-31 NOTE — TOC Transition Note (Addendum)
Transition of Care [facility]) - CM/SW Discharge Note   Patient Details  Name: Trevor Schmidt MRN: [id number] Date of Birth: November 11, 1940  Transition of [facility]) CM/SW Contact:  Sharin Mons, RN Phone Number: 08/31/2021, 10:50 AM   Clinical Narrative:    Patient will DC to: home Anticipated DC date: 08/31/2021 Family notified: yes Transport by: car        -s/p s/p L THA 7/31  Per MD patient ready for DC today after 2nd session with PT. RN, patient, patient's family, and Adin notified of DC.  Pt states wife to assist with care once d/c if needed. . Pt without Rx med concerns. Wife to provide transportation to home. Pt declined ordered DME. States has access to getting walker if needed.  Post hospital f/u noted on AVS.  RNCM will sign off for now as intervention is no longer needed. Please consult [place] again if new needs arise.   08/31/2021 @ 11:38 am Pt now requesting RW for home. Referral made with [facility]. Equipment will be delivered to pt's bedside prior to d/c.  Final next level of care: Home w Home Health Services Barriers to Discharge: No Barriers Identified   Patient Goals and CMS Choice     Choice offered to / list presented to : Patient  Discharge Placement                       Discharge Plan and Services                DME Arranged:  (Declined ordered DME, states doesn't need)         HH Arranged: PT HH Agency: [place] Date [place]: 08/31/21 Time [place]: 1006 Representative spoke with at [place]: [place] Determinants of Health ([place]) Interventions     Readmission Risk Interventions     No data to display

## 2021-08-31 NOTE — Plan of Care (Signed)

## 2021-09-14 ENCOUNTER — Ambulatory Visit (INDEPENDENT_AMBULATORY_CARE_PROVIDER_SITE_OTHER): Payer: PPO | Admitting: Orthopaedic Surgery

## 2021-09-14 DIAGNOSIS — M1612 Unilateral primary osteoarthritis, left hip: Secondary | ICD-10-CM

## 2021-09-14 MED ORDER — HYDROCODONE-ACETAMINOPHEN 5-325 MG PO TABS: 1.0000 | ORAL_TABLET | Freq: Every day | ORAL | 0 refills | Status: DC | PRN

## 2021-09-14 NOTE — Progress Notes (Signed)
Post-Op Visit Note   Patient: Trevor Schmidt           Date of Birth: September 10, 1940           MRN: [id number] Visit Date: 09/14/2021 PCP: Deland Pretty, MD   Assessment & Plan:  Chief Complaint:  Chief Complaint  Patient presents with   Left Hip - Routine Post Op   Visit Diagnoses:  1. Primary osteoarthritis of left hip     Plan: Trevor Schmidt is 2 weeks status post left total hip on 08/30/2021.  Doing very well overall.  Main complaint is swelling and soreness in the leg.  Had a light fall at home in the bathroom but fortunately he is doing okay.  Examination shows that he has a healed surgical incision without any signs of infection.  Expected postoperative swelling.  Negative Homans.  Walking without any assistive devices.  Sutures removed and Steri-Strips applied.  Handicap placard implant card provided.  Based on our discussion sounds like he is overdoing it with the home health PT so I think it is best just to discontinue this and have him just work on walking for the next month.  He is agreeable to this.  Continue aspirin for DVT prophylaxis.  Follow-Up Instructions: Return in about 4 weeks (around 10/12/2021).   Orders:  No orders of the defined types were placed in this encounter.  Meds ordered this encounter  Medications   HYDROcodone-acetaminophen (NORCO) 5-325 MG tablet    Sig: Take 1-2 tablets by mouth daily as needed.    Dispense:  20 tablet    Refill:  0    Imaging: No results found.  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   CAD in native artery    Hyperlipidemia LDL goal <70 11/28/2014   OSA on CPAP 11/28/2014   Hyperlipidemia 10/16/2014   LBBB (left bundle branch block)  10/16/2014   Mitral valve prolapse 10/07/2014   Essential hypertension 10/07/2014   BPH (benign prostatic hyperplasia) 10/07/2014   Past Medical History:  Diagnosis Date   Acute bronchitis 02/04/2015   Arthritis    Cancer (HCC)    bladder   Confusion    Depression    Dysrhythmia    LBBB   Edema    feet/ankles   GERD (gastroesophageal reflux disease)    HOH (hard of hearing)    aids   Hypertension    Kidney stones    MVP (mitral valve prolapse)    Prostate disease    Sleep apnea    CPAP    Family History  Problem Relation Age of Onset   Heart disease Mother    Prostate cancer Father    Healthy Maternal Grandmother    Healthy Maternal Grandfather    Healthy Paternal Grandmother    Healthy Paternal Grandfather     Past Surgical History:  Procedure Laterality Date   BACK SURGERY     BLADDER SURGERY     cancer   CARDIAC CATHETERIZATION N/A 12/09/2014   Procedure: Left Heart Cath and Coronary Angiography;  Surgeon: [place] Sine, MD;  Location: [place] [facility];  Service: Cardiovascular;  Laterality: N/A;   CATARACT EXTRACTION W/PHACO Right 09/08/2014   Procedure: CATARACT EXTRACTION  PHACO AND INTRAOCULAR LENS PLACEMENT (IOC);  Surgeon: Estill Cotta, MD;  Location: [facility];  Service: Ophthalmology;  Laterality: Right;  US:1:06.3 AP%:23.3 CDE:24.97 Fluid lot# [id number]   CEREBRAL ANEURYSM REPAIR  2001   coil and shunt placement   CYSTOSCOPY     litho x 2   KNEE ARTHROSCOPY Right    TONSILLECTOMY     tonsils and adenoids removed as a child   TOTAL HIP ARTHROPLASTY Left 08/30/2021   Procedure: LEFT TOTAL HIP ARTHROPLASTY ANTERIOR APPROACH;  Surgeon: Leandrew Koyanagi, MD;  Location: [place];  Service: Orthopedics;  Laterality: Left;  3-C   VENTRICULOPERITONEAL SHUNT  2001   WRIST SURGERY Bilateral    ctr   Social History   Occupational History   Occupation: Retired  Tobacco Use   Smoking status: Former    Types: Cigarettes    Quit date: 2003    Years since  quitting: 20.6   Smokeless tobacco: Former    Types: Snuff    Quit date: 2022   Tobacco comments:    Chews [place]  Vaping Use   Vaping Use: Never used  Substance and Sexual Activity   Alcohol use: No   Drug use: No   Sexual activity: Not on file

## 2021-10-07 ENCOUNTER — Emergency Department (HOSPITAL_COMMUNITY): Payer: PPO

## 2021-10-07 ENCOUNTER — Inpatient Hospital Stay (HOSPITAL_COMMUNITY)
Admission: EM | Admit: 2021-10-07 | Discharge: 2021-10-10 | DRG: 871 | Disposition: A | Discharge status: Home / Self Care | Payer: PPO | Attending: Internal Medicine | Admitting: Internal Medicine

## 2021-10-07 ENCOUNTER — Other Ambulatory Visit: Payer: Self-pay

## 2021-10-07 DIAGNOSIS — Z982 Presence of cerebrospinal fluid drainage device: Secondary | ICD-10-CM | POA: Diagnosis not present

## 2021-10-07 DIAGNOSIS — R6510 Systemic inflammatory response syndrome (SIRS) of non-infectious origin without acute organ dysfunction: Secondary | ICD-10-CM | POA: Diagnosis not present

## 2021-10-07 DIAGNOSIS — Z79899 Other long term (current) drug therapy: Secondary | ICD-10-CM

## 2021-10-07 DIAGNOSIS — I5022 Chronic systolic (congestive) heart failure: Secondary | ICD-10-CM | POA: Diagnosis present

## 2021-10-07 DIAGNOSIS — D696 Thrombocytopenia, unspecified: Secondary | ICD-10-CM | POA: Diagnosis present

## 2021-10-07 DIAGNOSIS — Z7982 Long term (current) use of aspirin: Secondary | ICD-10-CM | POA: Diagnosis not present

## 2021-10-07 DIAGNOSIS — I110 Hypertensive heart disease with heart failure: Secondary | ICD-10-CM | POA: Diagnosis present

## 2021-10-07 DIAGNOSIS — I2510 Atherosclerotic heart disease of native coronary artery without angina pectoris: Secondary | ICD-10-CM | POA: Diagnosis present

## 2021-10-07 DIAGNOSIS — Z888 Allergy status to other drugs, medicaments and biological substances status: Secondary | ICD-10-CM

## 2021-10-07 DIAGNOSIS — R531 Weakness: Secondary | ICD-10-CM | POA: Diagnosis not present

## 2021-10-07 DIAGNOSIS — I341 Nonrheumatic mitral (valve) prolapse: Secondary | ICD-10-CM | POA: Diagnosis present

## 2021-10-07 DIAGNOSIS — Z96642 Presence of left artificial hip joint: Secondary | ICD-10-CM | POA: Diagnosis present

## 2021-10-07 DIAGNOSIS — K219 Gastro-esophageal reflux disease without esophagitis: Secondary | ICD-10-CM | POA: Diagnosis present

## 2021-10-07 DIAGNOSIS — N179 Acute kidney failure, unspecified: Secondary | ICD-10-CM | POA: Diagnosis present

## 2021-10-07 DIAGNOSIS — Z8249 Family history of ischemic heart disease and other diseases of the circulatory system: Secondary | ICD-10-CM | POA: Diagnosis not present

## 2021-10-07 DIAGNOSIS — N400 Enlarged prostate without lower urinary tract symptoms: Secondary | ICD-10-CM | POA: Diagnosis present

## 2021-10-07 DIAGNOSIS — G4730 Sleep apnea, unspecified: Secondary | ICD-10-CM | POA: Diagnosis present

## 2021-10-07 DIAGNOSIS — E785 Hyperlipidemia, unspecified: Secondary | ICD-10-CM | POA: Diagnosis present

## 2021-10-07 DIAGNOSIS — A4189 Other specified sepsis: Secondary | ICD-10-CM | POA: Diagnosis present

## 2021-10-07 DIAGNOSIS — I10 Essential (primary) hypertension: Secondary | ICD-10-CM | POA: Diagnosis present

## 2021-10-07 DIAGNOSIS — J029 Acute pharyngitis, unspecified: Secondary | ICD-10-CM | POA: Diagnosis present

## 2021-10-07 DIAGNOSIS — Z87891 Personal history of nicotine dependence: Secondary | ICD-10-CM | POA: Diagnosis not present

## 2021-10-07 DIAGNOSIS — Z8042 Family history of malignant neoplasm of prostate: Secondary | ICD-10-CM

## 2021-10-07 DIAGNOSIS — Z881 Allergy status to other antibiotic agents status: Secondary | ICD-10-CM

## 2021-10-07 DIAGNOSIS — I447 Left bundle-branch block, unspecified: Secondary | ICD-10-CM | POA: Diagnosis present

## 2021-10-07 DIAGNOSIS — I5020 Unspecified systolic (congestive) heart failure: Secondary | ICD-10-CM | POA: Diagnosis not present

## 2021-10-07 DIAGNOSIS — U071 COVID-19: Secondary | ICD-10-CM | POA: Diagnosis present

## 2021-10-07 DIAGNOSIS — F32A Depression, unspecified: Secondary | ICD-10-CM | POA: Diagnosis present

## 2021-10-07 LAB — CBC WITH DIFFERENTIAL/PLATELET
Abs Immature Granulocytes: 0.02 10*3/uL (ref 0.00–0.07)
Basophils Absolute: 0 10*3/uL (ref 0.0–0.1)
Basophils Relative: 0 %
Eosinophils Absolute: 0 10*3/uL (ref 0.0–0.5)
Eosinophils Relative: 0 %
HCT: 35.3 % — ABNORMAL LOW (ref 39.0–52.0)
Hemoglobin: 12 g/dL — ABNORMAL LOW (ref 13.0–17.0)
Immature Granulocytes: 0 %
Lymphocytes Relative: 10 %
Lymphs Abs: 0.6 10*3/uL — ABNORMAL LOW (ref 0.7–4.0)
MCH: 31.8 pg (ref 26.0–34.0)
MCHC: 34 g/dL (ref 30.0–36.0)
MCV: 93.6 fL (ref 80.0–100.0)
Monocytes Absolute: 0.9 10*3/uL (ref 0.1–1.0)
Monocytes Relative: 14 %
Neutro Abs: 4.8 10*3/uL (ref 1.7–7.7)
Neutrophils Relative %: 76 %
Platelets: 114 10*3/uL — ABNORMAL LOW (ref 150–400)
RBC: 3.77 MIL/uL — ABNORMAL LOW (ref 4.22–5.81)
RDW: 13.3 % (ref 11.5–15.5)
WBC: 6.3 10*3/uL (ref 4.0–10.5)
nRBC: 0 % (ref 0.0–0.2)

## 2021-10-07 LAB — COMPREHENSIVE METABOLIC PANEL
ALT: 19 U/L (ref 0–44)
AST: 24 U/L (ref 15–41)
Albumin: 3.5 g/dL (ref 3.5–5.0)
Alkaline Phosphatase: 109 U/L (ref 38–126)
Anion gap: 7 (ref 5–15)
BUN: 22 mg/dL (ref 8–23)
CO2: 21 mmol/L — ABNORMAL LOW (ref 22–32)
Calcium: 8.3 mg/dL — ABNORMAL LOW (ref 8.9–10.3)
Chloride: 106 mmol/L (ref 98–111)
Creatinine, Ser: 1.42 mg/dL — ABNORMAL HIGH (ref 0.61–1.24)
GFR, Estimated: 50 mL/min — ABNORMAL LOW (ref >60)
Glucose, Bld: 108 mg/dL — ABNORMAL HIGH (ref 70–99)
Potassium: 3.9 mmol/L (ref 3.5–5.1)
Sodium: 134 mmol/L — ABNORMAL LOW (ref 135–145)
Total Bilirubin: 0.8 mg/dL (ref 0.3–1.2)
Total Protein: 6.3 g/dL — ABNORMAL LOW (ref 6.5–8.1)

## 2021-10-07 LAB — LACTIC ACID, PLASMA: Lactic Acid, Venous: 1.6 mmol/L (ref 0.5–1.9)

## 2021-10-07 LAB — C-REACTIVE PROTEIN: CRP: 7.8 mg/dL — ABNORMAL HIGH (ref <1.0)

## 2021-10-07 LAB — APTT: aPTT: 37 seconds — ABNORMAL HIGH (ref 24–36)

## 2021-10-07 LAB — PROTIME-INR
INR: 1.3 — ABNORMAL HIGH (ref 0.8–1.2)
Prothrombin Time: 15.7 seconds — ABNORMAL HIGH (ref 11.4–15.2)

## 2021-10-07 LAB — PHOSPHORUS: Phosphorus: 4 mg/dL (ref 2.5–4.6)

## 2021-10-07 LAB — PROCALCITONIN: Procalcitonin: 0.54 ng/mL

## 2021-10-07 LAB — LACTATE DEHYDROGENASE: LDH: 117 U/L (ref 98–192)

## 2021-10-07 LAB — HEPATITIS B SURFACE ANTIGEN: Hepatitis B Surface Ag: NONREACTIVE

## 2021-10-07 LAB — MAGNESIUM: Magnesium: 2.2 mg/dL (ref 1.7–2.4)

## 2021-10-07 LAB — D-DIMER, QUANTITATIVE: D-Dimer, Quant: 1.07 ug/mL-FEU — ABNORMAL HIGH (ref 0.00–0.50)

## 2021-10-07 LAB — FERRITIN: Ferritin: 128 ng/mL (ref 24–336)

## 2021-10-07 LAB — BRAIN NATRIURETIC PEPTIDE: B Natriuretic Peptide: 230.3 pg/mL — ABNORMAL HIGH (ref 0.0–100.0)

## 2021-10-07 LAB — SARS CORONAVIRUS 2 BY RT PCR: SARS Coronavirus 2 by RT PCR: POSITIVE — AB

## 2021-10-07 MED ORDER — ZINC SULFATE 220 (50 ZN) MG PO CAPS
220.0000 mg | ORAL_CAPSULE | Freq: Every day | ORAL | Status: DC
  Administered 2021-10-07 – 2021-10-10 (×4): 220 mg via ORAL
  Filled 2021-10-07 (×4): qty 1

## 2021-10-07 MED ORDER — ORAL CARE MOUTH RINSE: 15.0000 mL | OROMUCOSAL | Status: DC | PRN

## 2021-10-07 MED ORDER — OYSTER SHELL CALCIUM/D3 500-5 MG-MCG PO TABS
ORAL_TABLET | Freq: Two times a day (BID) | ORAL | Status: DC
  Administered 2021-10-07 – 2021-10-10 (×6): 1 via ORAL
  Filled 2021-10-07: qty 2
  Filled 2021-10-07 (×6): qty 1

## 2021-10-07 MED ORDER — HYDROCOD POLI-CHLORPHE POLI ER 10-8 MG/5ML PO SUER: 5.0000 mL | Freq: Two times a day (BID) | ORAL | Status: DC | PRN

## 2021-10-07 MED ORDER — ACETAMINOPHEN 325 MG PO TABS
650.0000 mg | ORAL_TABLET | Freq: Once | ORAL | Status: AC
Start: 2021-10-07 — End: 2021-10-07
  Administered 2021-10-07: 650 mg via ORAL
  Filled 2021-10-07: qty 2

## 2021-10-07 MED ORDER — NIRMATRELVIR/RITONAVIR (PAXLOVID)TABLET: 3.0000 | ORAL_TABLET | Freq: Two times a day (BID) | ORAL | Status: DC

## 2021-10-07 MED ORDER — ACETAMINOPHEN 325 MG PO TABS
650.0000 mg | ORAL_TABLET | Freq: Four times a day (QID) | ORAL | Status: DC | PRN
  Administered 2021-10-07: 650 mg via ORAL
  Filled 2021-10-07: qty 2

## 2021-10-07 MED ORDER — ZOLPIDEM TARTRATE 5 MG PO TABS
5.0000 mg | ORAL_TABLET | Freq: Every day | ORAL | Status: DC
  Administered 2021-10-07 – 2021-10-09 (×3): 5 mg via ORAL
  Filled 2021-10-07 (×3): qty 1

## 2021-10-07 MED ORDER — GUAIFENESIN-DM 100-10 MG/5ML PO SYRP
10.0000 mL | ORAL_SOLUTION | ORAL | Status: DC | PRN
  Administered 2021-10-07: 10 mL via ORAL
  Filled 2021-10-07: qty 10

## 2021-10-07 MED ORDER — AMLODIPINE BESYLATE 5 MG PO TABS
5.0000 mg | ORAL_TABLET | Freq: Every day | ORAL | Status: DC
  Administered 2021-10-08 – 2021-10-10 (×3): 5 mg via ORAL
  Filled 2021-10-07 (×3): qty 1

## 2021-10-07 MED ORDER — MOLNUPIRAVIR EUA 200MG CAPSULE
4.0000 | ORAL_CAPSULE | Freq: Two times a day (BID) | ORAL | Status: DC
  Administered 2021-10-07 – 2021-10-10 (×6): 800 mg via ORAL
  Filled 2021-10-07: qty 4

## 2021-10-07 MED ORDER — MAGNESIUM OXIDE -MG SUPPLEMENT 400 (240 MG) MG PO TABS
400.0000 mg | ORAL_TABLET | Freq: Every evening | ORAL | Status: DC
  Administered 2021-10-07 – 2021-10-09 (×3): 400 mg via ORAL
  Filled 2021-10-07 (×3): qty 1

## 2021-10-07 MED ORDER — CITALOPRAM HYDROBROMIDE 40 MG PO TABS
40.0000 mg | ORAL_TABLET | Freq: Every day | ORAL | Status: DC
  Administered 2021-10-08: 40 mg via ORAL
  Filled 2021-10-07: qty 1

## 2021-10-07 MED ORDER — CARVEDILOL 3.125 MG PO TABS
6.2500 mg | ORAL_TABLET | Freq: Two times a day (BID) | ORAL | Status: DC
  Administered 2021-10-09 (×2): 6.25 mg via ORAL
  Filled 2021-10-07 (×4): qty 2

## 2021-10-07 MED ORDER — ASPIRIN 81 MG PO TBEC
81.0000 mg | DELAYED_RELEASE_TABLET | Freq: Every day | ORAL | Status: DC
  Administered 2021-10-07 – 2021-10-10 (×4): 81 mg via ORAL
  Filled 2021-10-07 (×4): qty 1

## 2021-10-07 MED ORDER — VITAMIN C 500 MG PO TABS
500.0000 mg | ORAL_TABLET | Freq: Every day | ORAL | Status: DC
  Administered 2021-10-07 – 2021-10-10 (×4): 500 mg via ORAL
  Filled 2021-10-07 (×4): qty 1

## 2021-10-07 MED ORDER — ALBUTEROL SULFATE HFA 108 (90 BASE) MCG/ACT IN AERS
2.0000 | INHALATION_SPRAY | Freq: Four times a day (QID) | RESPIRATORY_TRACT | Status: DC
  Administered 2021-10-07 – 2021-10-10 (×11): 2 via RESPIRATORY_TRACT
  Filled 2021-10-07: qty 6.7

## 2021-10-07 MED ORDER — ENOXAPARIN SODIUM 40 MG/0.4ML IJ SOSY
40.0000 mg | PREFILLED_SYRINGE | INTRAMUSCULAR | Status: DC
  Administered 2021-10-07 – 2021-10-09 (×3): 40 mg via SUBCUTANEOUS
  Filled 2021-10-07 (×3): qty 0.4

## 2021-10-07 MED ORDER — FINASTERIDE 5 MG PO TABS
5.0000 mg | ORAL_TABLET | Freq: Every day | ORAL | Status: DC
  Administered 2021-10-07 – 2021-10-10 (×4): 5 mg via ORAL
  Filled 2021-10-07 (×4): qty 1

## 2021-10-07 MED ORDER — LACTATED RINGERS IV SOLN: INTRAVENOUS | Status: DC

## 2021-10-07 MED ORDER — MAGNESIUM OXIDE 400 MG PO TABS
400.0000 mg | ORAL_TABLET | Freq: Every evening | ORAL | Status: DC
  Filled 2021-10-07: qty 1

## 2021-10-07 MED ORDER — ATORVASTATIN CALCIUM 40 MG PO TABS
40.0000 mg | ORAL_TABLET | Freq: Every evening | ORAL | Status: DC
  Administered 2021-10-07 – 2021-10-09 (×3): 40 mg via ORAL
  Filled 2021-10-07 (×3): qty 1

## 2021-10-07 MED ORDER — VITAMIN B-12 1000 MCG PO TABS
500.0000 ug | ORAL_TABLET | ORAL | Status: DC
  Administered 2021-10-10: 500 ug via ORAL
  Filled 2021-10-07: qty 1

## 2021-10-07 MED ORDER — LACTATED RINGERS IV SOLN: INTRAVENOUS | Status: AC

## 2021-10-07 MED ORDER — SODIUM CHLORIDE 0.9% FLUSH
3.0000 mL | Freq: Two times a day (BID) | INTRAVENOUS | Status: DC
  Administered 2021-10-07 – 2021-10-10 (×7): 3 mL via INTRAVENOUS

## 2021-10-07 NOTE — Progress Notes (Signed)
   10/07/21 2035  Assess: MEWS Score  Temp (!) 101.8 F (38.8 C)  BP (!) 120/55  MAP (mmHg) 72  Pulse Rate (!) 59  Resp 16  SpO2 91 %  O2 Device Room Air  Assess: MEWS Score  MEWS Temp 2  MEWS Systolic 0  MEWS Pulse 0  MEWS RR 0  MEWS LOC 0  MEWS Score 2  MEWS Score Color Yellow  Assess: if the MEWS score is Yellow or Red  Were vital signs taken at a resting state? Yes  Focused Assessment No change from prior assessment  Does the patient meet 2 or more of the SIRS criteria? Yes  Does the patient have a confirmed or suspected source of infection? No  MEWS guidelines implemented *See Row Information* Yes  Treat  MEWS Interventions Administered prn meds/treatments  Pain Scale 0-10  Pain Score 0  Take Vital Signs  Increase Vital Sign Frequency  Yellow: Q 2hr X 2 then Q 4hr X 2, if remains yellow, continue Q 4hrs  Escalate  MEWS: Escalate Yellow: discuss with charge nurse/RN and consider discussing with provider and RRT  Notify: Charge Nurse/RN  Name of Charge Nurse/RN Notified Nikki, RN  Date Charge Nurse/RN Notified 10/07/21  Time Charge Nurse/RN Notified 2035  Notify: Provider  Provider Name/Title G. Shalhoub, MD  Date Provider Notified 10/07/21  Method of Notification Page  Notification Reason Other (Comment) (tempt 101.29F, Yellow MEWS)  Provider response No new orders  Document  Patient Outcome Stabilized after interventions  Progress note created (see row info) Yes  Assess: SIRS CRITERIA  SIRS Temperature  1  SIRS Pulse 0  SIRS Respirations  0  SIRS WBC 1  SIRS Score Sum  2

## 2021-10-07 NOTE — ED Triage Notes (Signed)
Pt BIB EMS from home for a sepsis alert. The pt was experiencing pressure in his head and hearing a whooshing noise. Pt has a history of a cranial shunt. Upon waking, the pt has no control of his extremities intermittently this AM. Pt has had no neurological symptoms with EMS. Pt reports dry cough x3 days. Pt reports nausea and weakness, fever chills, and fatigue. Per wife, the patient has been intermittently confused.   EMS Vitals RR 30 Capnography 27 92% on RA HR 60s  Given 559m of NS en route.

## 2021-10-07 NOTE — ED Provider Notes (Addendum)
[facility] EMERGENCY DEPARTMENT Provider Note   CSN: [id number] Arrival date & time: 10/07/21  1127     History  Chief Complaint  Patient presents with   Weakness    Marrero is a 81 y.o. male.  This is a 81 year old male presents from home due to altered mental status.  Patient states has had URI symptoms for the past several days.  Notes that he does have a history of a VP shunt and had a whooshing sensation in his head.  Had transient episode of whole body weakness which is since resolved.  Has had nausea but no vomiting.  No headache.  No diarrhea.  EMS initially called a code sepsis alert.  Has had some intermittent confusion.  On EMS arrival, pulse oximetry was 92% on room air.  Heart rate in the 60s.  Given half liter of saline and transported here       Home Medications Prior to Admission medications   Medication Sig Start Date End Date Taking? Authorizing Provider  amLODipine (NORVASC) 5 MG tablet TAKE 1 TABLET BY MOUTH DAILY 11/30/20   Troy Sine, MD  aspirin EC 81 MG tablet Take 1 tablet (81 mg total) by mouth in the morning and at bedtime. To be taken after surgery to prevent  blood clots.  Swallow whole. 08/23/21 08/23/22  Aundra Dubin, PA-C  atorvastatin (LIPITOR) 40 MG tablet Take 40 mg by mouth every evening.    [provider]  Calcium Carb-Cholecalciferol (CALCIUM 600-D PO) Take 1 tablet by mouth 2 (two) times daily.    [provider]  carvedilol (COREG) 6.25 MG tablet TAKE ONE TABLET BY MOUTH TWICE A DAY 08/31/20   Troy Sine, MD  citalopram (CELEXA) 40 MG tablet TAKE 1 TABLET (40 MG TOTAL) BY MOUTH DAILY. 03/12/15   Golden Circle, FNP  docusate sodium (COLACE) 100 MG capsule Take 1 capsule (100 mg total) by mouth daily as needed. 08/23/21 08/23/22  Aundra Dubin, PA-C  ENTRESTO 97-103 MG TAKE ONE TABLET BY MOUTH TWICE A DAY 04/22/21   Troy Sine, MD  finasteride (PROSCAR) 5 MG tablet Take 5 mg by mouth  daily.    [provider]  HYDROcodone-acetaminophen (NORCO) 5-325 MG tablet Take 1-2 tablets by mouth daily as needed. 09/14/21   Leandrew Koyanagi, MD  magnesium oxide (MAG-OX) 400 MG tablet Take 400 mg by mouth every evening.    [provider]  methocarbamol (ROBAXIN-750) 750 MG tablet Take 1 tablet (750 mg total) by mouth 2 (two) times daily as needed for muscle spasms. To be taken after surgery 08/23/21   Aundra Dubin, PA-C  Multiple Vitamins-Minerals (OCUVITE-LUTEIN PO) Take 2 capsules by mouth daily.    [provider]  ondansetron (ZOFRAN) 4 MG tablet Take 1 tablet (4 mg total) by mouth every 8 (eight) hours as needed for nausea or vomiting. 08/23/21   Aundra Dubin, PA-C  oxyCODONE-acetaminophen (PERCOCET) 5-325 MG tablet Take 1-2 tablets by mouth every 6 (six) hours as needed. To be taken after surgery 08/23/21   Aundra Dubin, PA-C  Polyethyl Glycol-Propyl Glycol (SYSTANE OP) Place 1 drop into both eyes daily.    [provider]  polyethylene glycol powder (MIRALAX) 17 GM/SCOOP powder Take 1 Container by mouth daily as needed for mild constipation.    [provider]  Potassium Chloride ER 20 MEQ TBCR TAKE ONE TABLET BY MOUTH DAILY 08/18/21   Troy Sine,  MD  spironolactone (ALDACTONE) 25 MG tablet Take 0.5 tablets (12.5 mg total) by mouth daily. 04/29/21   Troy Sine, MD  vitamin B-12 (CYANOCOBALAMIN) 500 MCG tablet Take 500 mcg by mouth every Sunday.    [provider]  zolpidem (AMBIEN) 5 MG tablet Take 5 mg by mouth at bedtime. 07/15/20   [provider]      Allergies    Buspirone and Ceftriaxone    Review of Systems   Review of Systems  All other systems reviewed and are negative.   Physical Exam Updated Vital Signs BP (!) 116/56   Pulse 62   Temp 99.3 F (37.4 C) (Oral)   Resp (!) 26   Ht 1.803 m ('5\' 11"'$ )   Wt 92.1 kg   SpO2 96%   BMI 28.31 kg/m  Physical Exam Vitals and nursing note  reviewed.  Constitutional:      General: He is not in acute distress.    Appearance: Normal appearance. He is well-developed. He is not toxic-appearing.  HENT:     Head: Normocephalic and atraumatic.  Eyes:     General: Lids are normal.     Conjunctiva/sclera: Conjunctivae normal.     Pupils: Pupils are equal, round, and reactive to light.  Neck:     Thyroid: No thyroid mass.     Trachea: No tracheal deviation.  Cardiovascular:     Rate and Rhythm: Normal rate and regular rhythm.     Heart sounds: Normal heart sounds. No murmur heard.    No gallop.  Pulmonary:     Effort: Pulmonary effort is normal. No respiratory distress.     Breath sounds: Normal breath sounds. No stridor. No decreased breath sounds, wheezing, rhonchi or rales.  Abdominal:     General: There is no distension.     Palpations: Abdomen is soft.     Tenderness: There is no abdominal tenderness. There is no rebound.  Musculoskeletal:        General: No tenderness. Normal range of motion.     Cervical back: Normal range of motion and neck supple.  Skin:    General: Skin is warm and dry.     Findings: No abrasion or rash.  Neurological:     General: No focal deficit present.     Mental Status: He is alert and oriented to person, place, and time. Mental status is at baseline.     GCS: GCS eye subscore is 4. GCS verbal subscore is 5. GCS motor subscore is 6.     Cranial Nerves: No cranial nerve deficit.     Sensory: No sensory deficit.     Motor: Motor function is intact.  Psychiatric:        Attention and Perception: Attention normal.        Speech: Speech normal.        Behavior: Behavior normal.     ED Results / Procedures / Treatments   Labs (all labs ordered are listed, but only abnormal results are displayed) Labs Reviewed  CULTURE, BLOOD (ROUTINE X 2)  CULTURE, BLOOD (ROUTINE X 2)  URINE CULTURE  SARS CORONAVIRUS 2 BY RT PCR  LACTIC ACID, PLASMA  LACTIC ACID, PLASMA  COMPREHENSIVE METABOLIC  PANEL  CBC WITH DIFFERENTIAL/PLATELET  PROTIME-INR  APTT  URINALYSIS, ROUTINE W REFLEX MICROSCOPIC    EKG EKG Interpretation  Date/Time:  Thursday October 07 2021 11:38:36 EDT Ventricular Rate:  60 PR Interval:  207 QRS Duration: 173 QT Interval:  453 QTC  Calculation: 453 R Axis:   -55 Text Interpretation: Sinus rhythm Left bundle branch block No significant change since last tracing Confirmed by Lacretia Leigh ([id number]) on 10/07/2021 1:09:09 PM  Radiology No results found.  Procedures Procedures    Medications Ordered in ED Medications  lactated ringers infusion (has no administration in time range)    ED Course/ Medical Decision Making/ A&P                           Medical Decision Making Amount and/or Complexity of Data Reviewed Labs: ordered. Radiology: ordered. ECG/medicine tests: ordered.  Risk OTC drugs. Prescription drug management.   Patient is EKG per my interpretation shows normal sinus rhythm.  Left bundle branch block noted which is old.  Chest x-ray per my interpretation is stable from prior studies.  He is febrile here.  Given Tylenol for his temperature.  COVID test is positive.  Not hypoxic and therefore would not be receiving Decadron.  .  Patient has history of intracranial shunt and concern for possible shunt malfunction.  Head CT per my interpretation and review shows no overt hydrocephalus, his renal function does show some evidence of dehydration.  Was given IV fluids here.  Will require admission.  CRITICAL CARE Performed by: Leota Jacobsen Total critical care time: 55 minutes Critical care time was exclusive of separately billable procedures and treating other patients. Critical care was necessary to treat or prevent imminent or life-threatening deterioration. Critical care was time spent personally by me on the following activities: development of treatment plan with patient and/or surrogate as well as nursing, discussions with  consultants, evaluation of patient's response to treatment, examination of patient, obtaining history from patient or surrogate, ordering and performing treatments and interventions, ordering and review of laboratory studies, ordering and review of radiographic studies, pulse oximetry and re-evaluation of patient's condition.         Final Clinical Impression(s) / ED Diagnoses Final diagnoses:  None    Rx / DC Orders ED Discharge Orders     None         Lacretia Leigh, MD 10/07/21 1511    Lacretia Leigh, MD 10/07/21 1512    Lacretia Leigh, MD 10/07/21 [id number]

## 2021-10-07 NOTE — H&P (Signed)
History and Physical    Patient: Trevor Schmidt:[id number] DOB: 01/18/1941 DOA: 10/07/2021 DOS: the patient was seen and examined on 10/07/2021 PCP: Deland Pretty, MD  Patient coming from: Home via EMS  Chief Complaint:  Chief Complaint  Patient presents with   Weakness   HPI: Trevor Schmidt is a 81 y.o. male with medical history significant of hypertension, hyperlipidemia, HFrEF, CAD, and history of brain aneurysm s/p VP shunt who presents with complaints of weakness.  History is obtained from the patient with assistance of his wife who is present at bedside.  Over the last couple days patient has been having sinus congestion and a nonproductive cough.  The cough was more persistent yesterday.  Throughout the day his wife checked his temperature but notes that he did not have a fever.  He had just recently had total hip replacement on 7/31 with Dr. Erlinda Hong, and his wife notes that on his follow-up visit he was walking without a cane.  However, over the last 2 days he had been more slowed in his ability to walk like he had to think of what to do next.  This morning when his wife came to wake him up to go to his doctor's appointment for lab work he was unable to get up.  He states that he was weak all over and unable to move.  Wife and asked him several question and he seemed to be more use for which she called EMS.  He has been having pressure in his head for which he presses on his shunt and it feels better.  Patient reports that he chronically intermittently hears a whooshing sound in his head that is not new.  Denies having any recent nausea, vomiting, diarrhea, chest pain, or dysuria symptoms.  He had been vaccinated against COVID-19 and received at least one of the boosters.  Upon admission into the emergency department patient was noted to be febrile up to 102.2 F with tachycardia and tachypnea meeting SIRS criteria.  Labs noted hemoglobin 12, platelets 114, sodium 134, BUN 22, creatinine 1.42,  calcium 8.3 and lactic acid 1.6.  CT scan of the head without contrast noted ventricular size probably increased relative to remote MRI from 2006 without overt hydrocephalus.  Chest x-ray was otherwise noted to be stable with right-sided chest catheter.  Blood cultures had been obtained but patient was found to be positive for COVID-19.  Review of Systems: As mentioned in the history of present illness. All other systems reviewed and are negative. Past Medical History:  Diagnosis Date   Acute bronchitis 02/04/2015   Arthritis    Cancer ([place])    bladder   Confusion    Depression    Dysrhythmia    LBBB   Edema    feet/ankles   GERD (gastroesophageal reflux disease)    HOH (hard of hearing)    aids   Hypertension    Kidney stones    MVP (mitral valve prolapse)    Prostate disease    Sleep apnea    CPAP   Past Surgical History:  Procedure Laterality Date   BACK SURGERY     BLADDER SURGERY     cancer   CARDIAC CATHETERIZATION N/A 12/09/2014   Procedure: Left Heart Cath and Coronary Angiography;  Surgeon: Troy Sine, MD;  Location: [facility];  Service: Cardiovascular;  Laterality: N/A;   CATARACT EXTRACTION W/PHACO Right 09/08/2014   Procedure: CATARACT EXTRACTION PHACO AND INTRAOCULAR LENS PLACEMENT (IOC);  Surgeon: Estill Cotta, MD;  Location: [facility];  Service: Ophthalmology;  Laterality: Right;  US:1:06.3 AP%:23.3 CDE:24.97 Fluid lot# [id number]   CEREBRAL ANEURYSM REPAIR  2001   coil and shunt placement   CYSTOSCOPY     litho x 2   KNEE ARTHROSCOPY Right    TONSILLECTOMY     tonsils and adenoids removed as a child   TOTAL HIP ARTHROPLASTY Left 08/30/2021   Procedure: LEFT TOTAL HIP ARTHROPLASTY ANTERIOR APPROACH;  Surgeon: Leandrew Koyanagi, MD;  Location: [facility];  Service: Orthopedics;  Laterality: Left;  3-C   VENTRICULOPERITONEAL SHUNT  2001   WRIST SURGERY Bilateral    ctr   Social History:  reports that he quit smoking about 20 years ago. His smoking  use included cigarettes. He quit smokeless tobacco use about 20 months ago.  His smokeless tobacco use included snuff. He reports that he does not drink alcohol and does not use drugs.  Allergies  Allergen Reactions   Buspirone Diarrhea   Ceftriaxone Rash    Family History  Problem Relation Age of Onset   Heart disease Mother    Prostate cancer Father    Healthy Maternal Grandmother    Healthy Maternal Grandfather    Healthy Paternal Grandmother    Healthy Paternal Grandfather     Prior to Admission medications   Medication Sig Start Date End Date Taking? Authorizing Provider  amLODipine (NORVASC) 5 MG tablet TAKE 1 TABLET BY MOUTH DAILY 11/30/20   Troy Sine, MD  aspirin EC 81 MG tablet Take 1 tablet (81 mg total) by mouth in the morning and at bedtime. To be taken after surgery to prevent  blood clots.  Swallow whole. 08/23/21 08/23/22  Aundra Dubin, PA-C  atorvastatin (LIPITOR) 40 MG tablet Take 40 mg by mouth every evening.    [provider]  Calcium Carb-Cholecalciferol (CALCIUM 600-D PO) Take 1 tablet by mouth 2 (two) times daily.    [provider]  carvedilol (COREG) 6.25 MG tablet TAKE ONE TABLET BY MOUTH TWICE A DAY 08/31/20   Troy Sine, MD  citalopram (CELEXA) 40 MG tablet TAKE 1 TABLET (40 MG TOTAL) BY MOUTH DAILY. 03/12/15   Golden Circle, FNP  docusate sodium (COLACE) 100 MG capsule Take 1 capsule (100 mg total) by mouth daily as needed. 08/23/21 08/23/22  Aundra Dubin, PA-C  ENTRESTO 97-103 MG TAKE ONE TABLET BY MOUTH TWICE A DAY 04/22/21   Troy Sine, MD  finasteride (PROSCAR) 5 MG tablet Take 5 mg by mouth daily.    [provider]  HYDROcodone-acetaminophen (NORCO) 5-325 MG tablet Take 1-2 tablets by mouth daily as needed. 09/14/21   Leandrew Koyanagi, MD  magnesium oxide (MAG-OX) 400 MG tablet Take 400 mg by mouth every evening.    [provider]  methocarbamol (ROBAXIN-750) 750 MG tablet Take 1 tablet (750 mg  total) by mouth 2 (two) times daily as needed for muscle spasms. To be taken after surgery 08/23/21   Aundra Dubin, PA-C  Multiple Vitamins-Minerals (OCUVITE-LUTEIN PO) Take 2 capsules by mouth daily.    [provider]  ondansetron (ZOFRAN) 4 MG tablet Take 1 tablet (4 mg total) by mouth every 8 (eight) hours as needed for nausea or vomiting. 08/23/21   Aundra Dubin, PA-C  oxyCODONE-acetaminophen (PERCOCET) 5-325 MG tablet Take 1-2 tablets by mouth every 6 (six) hours as needed. To be taken after surgery 08/23/21   Aundra Dubin, PA-C  Polyethyl  Glycol-Propyl Glycol (SYSTANE OP) Place 1 drop into both eyes daily.    [provider]  polyethylene glycol powder (MIRALAX) 17 GM/SCOOP powder Take 1 Container by mouth daily as needed for mild constipation.    [provider]  Potassium Chloride ER 20 MEQ TBCR TAKE ONE TABLET BY MOUTH DAILY 08/18/21   Troy Sine, MD  spironolactone (ALDACTONE) 25 MG tablet Take 0.5 tablets (12.5 mg total) by mouth daily. 04/29/21   Troy Sine, MD  vitamin B-12 (CYANOCOBALAMIN) 500 MCG tablet Take 500 mcg by mouth every Sunday.    [provider]  zolpidem (AMBIEN) 5 MG tablet Take 5 mg by mouth at bedtime. 07/15/20   [provider]    Physical Exam: Vitals:   10/07/21 1300 10/07/21 1329 10/07/21 1330 10/07/21 1345  BP: (!) 128/53  (!) 116/53 (!) 126/54  Pulse: (!) 50  (!) 59 (!) 56  Resp: (!) 27  (!) 32 (!) 23  Temp:  (!) 101 F (38.3 C)    TempSrc:  Rectal    SpO2: 96%  96% 94%  Weight:      Height:       Exam  Constitutional: Elderly male currently in no acute distress Eyes: PERRL, lids and conjunctivae normal ENMT: Mucous membranes are moist.   Neck: normal, supple, no JVD Respiratory: clear to auscultation bilaterally, no wheezing, no crackles. Normal respiratory effort. No accessory muscle use.  Cardiovascular: Bradycardic.  No extremity edema.  Abdomen: no tenderness, no masses palpated.    Bowel sounds positive.  Musculoskeletal: no clubbing / cyanosis. No joint deformity upper and lower extremities. Good ROM, no contractures. Normal muscle tone.  Skin: no rashes, lesions, ulcers. No induration Neurologic: CN 2-12 grossly intact.  Strength 5/5 in all 4.  Psychiatric: Normal judgment and insight. Alert and oriented x 3. Normal mood.   Data Reviewed:  EKG revealed sinus rhythm at 60 bpm with left bundle branch block  Assessment and Plan: COVID-19 infection Acute.  Patient presents with generalized weakness after having nasal congestion and a nonproductive cough over the last couple days.  COVID-19 screening was positive.  Chest x-ray otherwise noted be clear and O2 saturations currently maintained on room air. -Admit to a telemetry bed -COVID-19 admission order set utilized -Airborne precautions -Check inflammatory markers monitor daily -Albuterol inhaler -Molnupiravir -Vitamin C and zinc -Antitussives as needed -Tylenol as needed for fever.  SIRS Acute.  Patient was noted to be tachycardic and tachypneic with fever up to 102.2 F meeting SIRS criteria.  Lactic acid was reassuring at 1.6. -Follow-up blood cultures -Follow-up urinalysis  Acute kidney injury Patient presents with creatinine elevated up to 1.42 with BUN 22.  Baseline creatinine was noted to be around 1.04 in July of this year.  Patient had initially been started on IV fluids at 150 mL/h. -Reduce IV fluids to 100 mL/h for 1 L given heart function -Held nephrotoxic agents -Recheck kidney function in a.m.  Generalized weakness Patient reported feeling globally weak.  CT did not note any significant abnormality. -PT to eval and treat in a.m.  Heart failure with reduced EF Chronic.  Last echocardiogram noted EF of 30-35% with grade 1 diastolic dysfunction in 6295. -Strict I&O's and daily weights -Resume diuretics such as spironolactone when medically appropriate  CAD -Continue aspirin and  statin  Essential hypertension Blood pressures initially noted to be soft 102/60.   -Hold Entresto and spironolactone due to AKI  History of cerebral aneurysm s/p VP shunt CT  scan noted ventricular size to be mildly increased relative to remote MRI from 2006 but may be secondary to progressive cerebral atrophy with no hydrocephalus appreciated.  Thrombocytopenia Acute.  Platelet count 114 on admission.  No reports of bleeding. -Continue to monitor  BPH -Continue finasteride  Advance Care Planning:   Code Status: Full Code    Consults: None Family Communication: Wife at the bedside Severity of Illness: The appropriate patient status for this patient is INPATIENT. Inpatient status is judged to be reasonable and necessary in order to provide the required intensity of service to ensure the patient's safety. The patient's presenting symptoms, physical exam findings, and initial radiographic and laboratory data in the context of their chronic comorbidities is felt to place them at high risk for further clinical deterioration. Furthermore, it is not anticipated that the patient will be medically stable for discharge from the hospital within 2 midnights of admission.   * I certify that at the point of admission it is my clinical judgment that the patient will require inpatient hospital care spanning beyond 2 midnights from the point of admission due to high intensity of service, high risk for further deterioration and high frequency of surveillance required.*  Author: Norval Morton, MD 10/07/2021 3:18 PM  For on call review [email or web address].

## 2021-10-07 NOTE — Progress Notes (Signed)
HOSPITAL MEDICINE OVERNIGHT EVENT NOTE     Notified by nursing the patient is exhibiting a yellow MEWS score with a fever of 101.8 F.  Chart reviewed, patient is currently hospitalized for COVID-19 infection with SIRS.  Per my discussion with nursing, patient has exhibited no significant change in symptoms since hospitalization earlier in the evening.   Patient is hemodynamically stable.  Patient currently has no complaints.  Continue current plan of care including molnupiravir as ordered by the admitting provider.  Continuing to monitor patient closely.   Vernelle Emerald  MD [facility]

## 2021-10-07 NOTE — ED Notes (Signed)
ED TO INPATIENT HANDOFF REPORT  ED Nurse Name and Phone #: Andee Poles [phone]  S Name/Age/Gender Trevor Schmidt 81 y.o. male Room/Bed: 031C/031C  Code Status   Code Status: Full Code  Home/SNF/Other Home Patient oriented to: self, place, time, and situation Is this baseline? Yes   Triage Complete: Triage complete  Chief Complaint COVID-19 virus infection [U07.1]  Triage Note Pt BIB EMS from home for a sepsis alert. The pt was experiencing pressure in his head and hearing a whooshing noise. Pt has a history of a cranial shunt. Upon waking, the pt has no control of his extremities intermittently this AM. Pt has had no neurological symptoms with EMS. Pt reports dry cough x3 days. Pt reports nausea and weakness, fever chills, and fatigue. Per wife, the patient has been intermittently confused.   EMS Vitals RR 30 Capnography 27 92% on RA HR 60s  Given 524m of NS en route.   Allergies Allergies  Allergen Reactions   Buspirone Diarrhea   Ceftriaxone Rash    Level of Care/Admitting Diagnosis ED Disposition     ED Disposition  Admit   Condition  --   [facility][[id number]]  Level of Care: Telemetry Medical [104]  [facility] level of care is available:: No  Covid Evaluation: Confirmed COVID Positive  Diagnosis: COVID-19 virus infection [[[id number]] Admitting Physician: SNorval Morton[[[id number] Attending Physician: SNorval Morton[[[id number] Certification:: I certify this patient will need inpatient services for at least 2 midnights  Estimated Length of Stay: 2          B Medical/Surgery History Past Medical History:  Diagnosis Date   Acute bronchitis 02/04/2015   Arthritis    Cancer ([place]    bladder   Confusion    Depression    Dysrhythmia    LBBB   Edema    feet/ankles   GERD (gastroesophageal reflux disease)    HOH (hard of hearing)    aids    Hypertension    Kidney stones    MVP (mitral valve prolapse)    Prostate disease    Sleep apnea    CPAP   Past Surgical History:  Procedure Laterality Date   BACK SURGERY     BLADDER SURGERY     cancer   CARDIAC CATHETERIZATION N/A 12/09/2014   Procedure: Left Heart Cath and Coronary Angiography;  Surgeon: TTroy Sine MD;  [facility];  Service: Cardiovascular;  Laterality: N/A;   CATARACT EXTRACTION W/PHACO Right 09/08/2014   Procedure: CATARACT EXTRACTION PHACO AND INTRAOCULAR LENS PLACEMENT (IOC);  Surgeon: SEstill Cotta MD;  Location: [facility];  Service: Ophthalmology;  Laterality: Right;  US:1:06.3 AP%:23.3 CDE:24.97 Fluid lot# [id number]   CEREBRAL ANEURYSM REPAIR  2001   coil and shunt placement   CYSTOSCOPY     litho x 2   KNEE ARTHROSCOPY Right    TONSILLECTOMY     tonsils and adenoids removed as a child   TOTAL HIP ARTHROPLASTY Left 08/30/2021   Procedure: LEFT TOTAL HIP ARTHROPLASTY ANTERIOR APPROACH;  Surgeon: XLeandrew Koyanagi MD;  [place]  Service: Orthopedics;  Laterality: Left;  3-C   VENTRICULOPERITONEAL SHUNT  2001   WRIST SURGERY Bilateral    ctr     A IV Location/Drains/Wounds Patient Lines/Drains/Airways Status     Active Line/Drains/Airways     Name Placement date Placement time Site Days  Peripheral IV 10/07/21 18 G Left Antecubital 10/07/21  1142  Antecubital  less than 1   Peripheral IV 10/07/21 20 G Right Antecubital 10/07/21  1142  Antecubital  less than 1   Incision (Closed) 08/30/21 Hip Left 08/30/21  0834  -- 38            Intake/Output Last 24 hours  Intake/Output Summary (Last 24 hours) at 10/07/2021 1711 Last data filed at 10/07/2021 1611 Gross per 24 hour  Intake 1.16 ml  Output --  Net 1.16 ml    Labs/Imaging Results for orders placed or performed during the hospital encounter of 10/07/21 (from the past 48 hour(s))  Lactic acid, plasma     Status: None   Collection Time: 10/07/21 11:35 AM   Result Value Ref Range   Lactic Acid, Venous 1.6 0.5 - 1.9 mmol/L    Comment: Performed at [facility], [place] [address] [place], [place] [address]  Comprehensive metabolic panel     Status: Abnormal   Collection Time: 10/07/21 11:35 AM  Result Value Ref Range   Sodium 134 (L) 135 - 145 mmol/L   Potassium 3.9 3.5 - 5.1 mmol/L   Chloride 106 98 - 111 mmol/L   CO2 21 (L) 22 - 32 mmol/L   Glucose, Bld 108 (H) 70 - 99 mg/dL    Comment: Glucose reference range applies only to samples taken after fasting for at least 8 hours.   BUN 22 8 - 23 mg/dL   Creatinine, Ser 1.42 (H) 0.61 - 1.24 mg/dL   Calcium 8.3 (L) 8.9 - 10.3 mg/dL   Total Protein 6.3 (L) 6.5 - 8.1 g/dL   Albumin 3.5 3.5 - 5.0 g/dL   AST 24 15 - 41 U/L   ALT 19 0 - 44 U/L   Alkaline Phosphatase 109 38 - 126 U/L   Total Bilirubin 0.8 0.3 - 1.2 mg/dL   GFR, Estimated 50 (L) >60 mL/min    Comment: (NOTE) Calculated using the CKD-EPI Creatinine Equation (2021)    Anion gap 7 5 - 15    Comment: Performed at [facility], [place] [address] [place], [place] [address]  CBC with Differential     Status: Abnormal   Collection Time: 10/07/21 11:35 AM  Result Value Ref Range   WBC 6.3 4.0 - 10.5 K/uL   RBC 3.77 (L) 4.22 - 5.81 MIL/uL   Hemoglobin 12.0 (L) 13.0 - 17.0 g/dL   HCT 35.3 (L) 39.0 - 52.0 %   MCV 93.6 80.0 - 100.0 fL   MCH 31.8 26.0 - 34.0 pg   MCHC 34.0 30.0 - 36.0 g/dL   RDW 13.3 11.5 - 15.5 %   Platelets 114 (L) 150 - 400 K/uL    Comment: REPEATED TO VERIFY   nRBC 0.0 0.0 - 0.2 %   Neutrophils Relative % 76 %   Neutro Abs 4.8 1.7 - 7.7 K/uL   Lymphocytes Relative 10 %   Lymphs Abs 0.6 (L) 0.7 - 4.0 K/uL   Monocytes Relative 14 %   Monocytes Absolute 0.9 0.1 - 1.0 K/uL   Eosinophils Relative 0 %   Eosinophils Absolute 0.0 0.0 - 0.5 K/uL   Basophils Relative 0 %   Basophils Absolute 0.0 0.0 - 0.1 K/uL   Immature Granulocytes 0 %   Abs Immature Granulocytes 0.02 0.00 - 0.07 K/uL    Comment:  Performed at [facility], [place] [address] [place], [place] [address]  Protime-INR  Status: Abnormal   Collection Time: 10/07/21 11:35 AM  Result Value Ref Range   Prothrombin Time 15.7 (H) 11.4 - 15.2 seconds   INR 1.3 (H) 0.8 - 1.2    Comment: (NOTE) INR goal varies based on device and disease states. Performed at [facility], [place] [address], [place], [place] [address]   APTT     Status: Abnormal   Collection Time: 10/07/21 11:35 AM  Result Value Ref Range   aPTT 37 (H) 24 - 36 seconds    Comment:        IF BASELINE aPTT IS ELEVATED, SUGGEST PATIENT RISK ASSESSMENT BE USED TO DETERMINE APPROPRIATE ANTICOAGULANT THERAPY. Performed at [facility], [place] [address], [place], [place] [address]   SARS Coronavirus 2 by RT PCR (hospital order, performed in [facility] hospital lab) *cepheid single result test* Anterior Nasal Swab     Status: Abnormal   Collection Time: 10/07/21 11:47 AM   Specimen: Anterior Nasal Swab  Result Value Ref Range   SARS Coronavirus 2 by RT PCR POSITIVE (A) NEGATIVE    Comment: (NOTE) SARS-CoV-2 target nucleic acids are DETECTED  SARS-CoV-2 RNA is generally detectable in upper respiratory specimens  during the acute phase of infection.  Positive results are indicative  of the presence of the identified virus, but do not rule out bacterial infection or co-infection with other pathogens not detected by the test.  Clinical correlation with patient history and  other diagnostic information is necessary to determine patient infection status.  The expected result is negative.  Fact Sheet for Patients:   https://www.patel.info/   Fact Sheet for Healthcare Providers:   https://hall.com/    This test is not yet approved or cleared by the [place] FDA and  has been authorized for detection and/or diagnosis of SARS-CoV-2 by FDA under an Emergency Use Authorization (EUA).  This EUA  will remain in effect (meaning this test can be used) for the duration of  the COVID-19 declaration under Section 564(b)(1)  of the Act, 21 U.S.C. section 360-bbb-3(b)(1), unless the authorization is terminated or revoked sooner.   Performed at [facility], [place] [address] [place], [place] [address]   D-dimer, quantitative     Status: Abnormal   Collection Time: 10/07/21  4:11 PM  Result Value Ref Range   D-Dimer, Quant 1.07 (H) 0.00 - 0.50 ug/mL-FEU    Comment: (NOTE) At the manufacturer cut-off value of 0.5 g/mL FEU, this assay has a negative predictive value of 95-100%.This assay is intended for use in conjunction with a clinical pretest probability (PTP) assessment model to exclude pulmonary embolism (PE) and deep venous thrombosis (DVT) in outpatients suspected of PE or DVT. Results should be correlated with clinical presentation. Performed at [facility], [place] [address] [place], [place] [address]    CT Head Wo Contrast  Result Date: 10/07/2021 CLINICAL DATA:  Mental status change, unknown cause EXAM: CT HEAD WITHOUT CONTRAST TECHNIQUE: Contiguous axial images were obtained from the base of the skull through the vertex without intravenous contrast. RADIATION DOSE REDUCTION: This exam was performed according to the departmental dose-optimization program which includes automated exposure control, adjustment of the mA and/or kV according to patient size and/or use of iterative reconstruction technique. COMPARISON:  MRI head 01/27/2005. FINDINGS: Brain: Ventricular size is mildly increased relative to remote MRI from 2006, but this may be due to progressive cerebral atrophy. No overt hydrocephalus. Right frontal approach ventriculostomy catheter with the tip  near the foramen of [place]. Visualized catheter is intact. No evidence of acute large vascular territory infarct. Moderate patchy white matter hypodensities, nonspecific but compatible with chronic microvascular  ischemic disease. No evidence of acute hemorrhage, mass lesion, midline shift. Vascular: Embolization coils, probably in the region of the anterior communicating artery. No hyperdense vessel identified. Calcific intracranial atherosclerosis. Skull: No acute fracture. Right frontal burr hole. Sinuses/Orbits: Largely clear sinuses.  No acute orbital findings. Other: No mastoid effusions. IMPRESSION: 1. Ventricular size is mildly increased relative to remote MRI from 2006, but this may be due to progressive cerebral atrophy. No overt hydrocephalus. 2. Chronic microvascular ischemic disease. Electronically Signed   By: Margaretha Sheffield M.D.   On: 10/07/2021 14:12   DG Chest Port 1 View  Result Date: 10/07/2021 CLINICAL DATA:  Questionable sepsis EXAM: PORTABLE CHEST 1 VIEW COMPARISON:  06/22/2019 FINDINGS: Dystrophic calcification around a right-sided chest shown catheter. Interstitial coarsening that is stable. There is no edema, consolidation, effusion, or pneumothorax. Borderline heart size and right hilar prominence that is also stable. IMPRESSION: Stable from 2021.  No acute finding. Electronically Signed   By: Jorje Guild M.D.   On: 10/07/2021 12:19    Pending Labs Unresulted Labs (From admission, onward)     Start     Ordered   10/08/21 0500  CBC with Differential/Platelet  Daily at 5am,   R      10/07/21 1528   10/08/21 0500  Comprehensive metabolic panel  Daily at 5am,   R      10/07/21 1528   10/08/21 0500  C-reactive protein  Daily at 5am,   R      10/07/21 1528   10/08/21 0500  D-dimer, quantitative  Daily at 5am,   R      10/07/21 1528   10/08/21 0500  Ferritin  Daily at 5am,   R      10/07/21 1528   10/07/21 1529  Magnesium  Daily at 5am,   R      10/07/21 1528   10/07/21 1529  Phosphorus  Daily at 5am,   R      10/07/21 1528   10/07/21 1528  Procalcitonin  Once,   R        10/07/21 1528   10/07/21 1528  Lactate dehydrogenase  Once,   R        10/07/21 1528   10/07/21 1528   C-reactive protein  Once,   R        10/07/21 1528   10/07/21 1528  Brain natriuretic peptide  Once,   R        10/07/21 1528   10/07/21 1528  Hepatitis B surface antigen  Once,   R        10/07/21 1528   10/07/21 1528  Ferritin  Once,   R        10/07/21 1528   10/07/21 1146  Lactic acid, plasma  (Undifferentiated presentation (screening labs and basic nursing orders))  Now then every 2 hours,   R      10/07/21 1146   10/07/21 1146  Blood Culture (routine x 2)  (Undifferentiated presentation (screening labs and basic nursing orders))  BLOOD CULTURE X 2,   STAT      10/07/21 1146   10/07/21 1146  Urinalysis, Routine w reflex microscopic  (Undifferentiated presentation (screening labs and basic nursing orders))  ONCE - URGENT,   URGENT        10/07/21 1146  10/07/21 1146  Urine Culture  (Undifferentiated presentation (screening labs and basic nursing orders))  ONCE - URGENT,   URGENT       Question:  Indication  Answer:  Dysuria   10/07/21 1146            Vitals/Pain Today's Vitals   10/07/21 1545 10/07/21 1600 10/07/21 1615 10/07/21 1630  BP: (!) 105/47 (!) 113/54 102/60 (!) 114/59  Pulse: (!) 52 (!) 51 (!) 49 (!) 48  Resp: (!) '27 17 17 '$ (!) 28  Temp:      TempSrc:      SpO2: 92% 96% 93% 92%  Weight:      Height:      PainSc:        Isolation Precautions Airborne and Contact precautions  Medications Medications  enoxaparin (LOVENOX) injection 40 mg (has no administration in time range)  sodium chloride flush (NS) 0.9 % injection 3 mL (3 mLs Intravenous Given 10/07/21 1606)  molnupiravir EUA (LAGEVRIO) capsule 800 mg (has no administration in time range)  albuterol (VENTOLIN HFA) 108 (90 Base) MCG/ACT inhaler 2 puff (2 puffs Inhalation Given 10/07/21 1607)  guaiFENesin-dextromethorphan (ROBITUSSIN DM) 100-10 MG/5ML syrup 10 mL (has no administration in time range)  chlorpheniramine-HYDROcodone (TUSSIONEX) 10-8 MG/5ML suspension 5 mL (has no administration in time range)   ascorbic acid (VITAMIN C) tablet 500 mg (500 mg Oral Given 10/07/21 1605)  zinc sulfate capsule 220 mg (220 mg Oral Given 10/07/21 1605)  lactated ringers infusion ( Intravenous New Bag/Given 10/07/21 1611)  acetaminophen (TYLENOL) tablet 650 mg (has no administration in time range)  finasteride (PROSCAR) tablet 5 mg (has no administration in time range)  acetaminophen (TYLENOL) tablet 650 mg (650 mg Oral Given 10/07/21 1201)    Mobility walks Low fall risk   Focused Assessments  R Recommendations: See Admitting Provider Note  Report given to:   Additional Notes:

## 2021-10-08 DIAGNOSIS — U071 COVID-19: Secondary | ICD-10-CM | POA: Diagnosis not present

## 2021-10-08 LAB — COMPREHENSIVE METABOLIC PANEL
ALT: 22 U/L (ref 0–44)
AST: 39 U/L (ref 15–41)
Albumin: 3.1 g/dL — ABNORMAL LOW (ref 3.5–5.0)
Alkaline Phosphatase: 82 U/L (ref 38–126)
Anion gap: 10 (ref 5–15)
BUN: 21 mg/dL (ref 8–23)
CO2: 19 mmol/L — ABNORMAL LOW (ref 22–32)
Calcium: 8.2 mg/dL — ABNORMAL LOW (ref 8.9–10.3)
Chloride: 106 mmol/L (ref 98–111)
Creatinine, Ser: 1.26 mg/dL — ABNORMAL HIGH (ref 0.61–1.24)
GFR, Estimated: 57 mL/min — ABNORMAL LOW (ref >60)
Glucose, Bld: 114 mg/dL — ABNORMAL HIGH (ref 70–99)
Potassium: 3.4 mmol/L — ABNORMAL LOW (ref 3.5–5.1)
Sodium: 135 mmol/L (ref 135–145)
Total Bilirubin: 0.6 mg/dL (ref 0.3–1.2)
Total Protein: 5.8 g/dL — ABNORMAL LOW (ref 6.5–8.1)

## 2021-10-08 LAB — CBC WITH DIFFERENTIAL/PLATELET
Abs Immature Granulocytes: 0.03 10*3/uL (ref 0.00–0.07)
Basophils Absolute: 0 10*3/uL (ref 0.0–0.1)
Basophils Relative: 0 %
Eosinophils Absolute: 0 10*3/uL (ref 0.0–0.5)
Eosinophils Relative: 0 %
HCT: 33.3 % — ABNORMAL LOW (ref 39.0–52.0)
Hemoglobin: 11.1 g/dL — ABNORMAL LOW (ref 13.0–17.0)
Immature Granulocytes: 0 %
Lymphocytes Relative: 18 %
Lymphs Abs: 1.2 10*3/uL (ref 0.7–4.0)
MCH: 30.9 pg (ref 26.0–34.0)
MCHC: 33.3 g/dL (ref 30.0–36.0)
MCV: 92.8 fL (ref 80.0–100.0)
Monocytes Absolute: 1 10*3/uL (ref 0.1–1.0)
Monocytes Relative: 15 %
Neutro Abs: 4.5 10*3/uL (ref 1.7–7.7)
Neutrophils Relative %: 67 %
Platelets: 109 10*3/uL — ABNORMAL LOW (ref 150–400)
RBC: 3.59 MIL/uL — ABNORMAL LOW (ref 4.22–5.81)
RDW: 13.2 % (ref 11.5–15.5)
WBC: 6.8 10*3/uL (ref 4.0–10.5)
nRBC: 0 % (ref 0.0–0.2)

## 2021-10-08 LAB — D-DIMER, QUANTITATIVE: D-Dimer, Quant: 1.28 ug/mL-FEU — ABNORMAL HIGH (ref 0.00–0.50)

## 2021-10-08 LAB — C-REACTIVE PROTEIN: CRP: 7.8 mg/dL — ABNORMAL HIGH (ref <1.0)

## 2021-10-08 LAB — MAGNESIUM: Magnesium: 1.8 mg/dL (ref 1.7–2.4)

## 2021-10-08 MED ORDER — CITALOPRAM HYDROBROMIDE 20 MG PO TABS
20.0000 mg | ORAL_TABLET | Freq: Every day | ORAL | Status: DC
  Administered 2021-10-09 – 2021-10-10 (×2): 20 mg via ORAL
  Filled 2021-10-08 (×2): qty 1

## 2021-10-08 MED ORDER — POTASSIUM CHLORIDE CRYS ER 20 MEQ PO TBCR
40.0000 meq | EXTENDED_RELEASE_TABLET | Freq: Once | ORAL | Status: AC
  Administered 2021-10-08: 40 meq via ORAL
  Filled 2021-10-08: qty 2

## 2021-10-08 MED ORDER — ONDANSETRON HCL 4 MG/2ML IJ SOLN: 4.0000 mg | Freq: Four times a day (QID) | INTRAMUSCULAR | Status: DC | PRN

## 2021-10-08 MED ORDER — AZITHROMYCIN 500 MG PO TABS
500.0000 mg | ORAL_TABLET | Freq: Every day | ORAL | Status: DC
Start: 2021-10-08 — End: 2021-10-10
  Administered 2021-10-08 – 2021-10-09 (×2): 500 mg via ORAL
  Filled 2021-10-08 (×2): qty 1

## 2021-10-08 NOTE — Progress Notes (Signed)
Ok to reduce Celexa to max dose of '20mg'$  qday to reduce risk of qtc prolongation in age>60 per Dr. Bess Harvest, PharmD, BCIDP, AAHIVP, CPP Infectious Disease Pharmacist 10/08/2021 2:05 PM

## 2021-10-08 NOTE — Hospital Course (Signed)
PMH of HTN, HLD, HFrEF, CAD, VP shunt present to the hospital with complaints of generalized weakness found to have COVID-19 infection. Currently being treated with molnupiravir.  CRP elevated significantly therefore monitoring closely.

## 2021-10-08 NOTE — Evaluation (Signed)
Physical Therapy Evaluation Patient Details Name: Trevor Schmidt MRN: [id number] DOB: 1940-10-15 Today's Date: 10/08/2021  History of Present Illness  81 y.o. male  who presents with complaints of weakness and cough. Brought to ED via EMS 10/07/21 with fever, tachycardic and tachypnea. Found to be COVID +. PMH:s/p 7/31 L THA, hypertension, hyperlipidemia, HFrEF, CAD, and history of brain aneurysm s/p VP shunt  Clinical Impression  Patient evaluated by Physical Therapy with no further acute PT needs identified. All education has been completed and the patient has no further questions. Pt has no follow-up Physical Therapy or equipment needs. PT is signing off. Thank you for this referral.        Recommendations for follow up therapy are one component of a multi-disciplinary discharge planning process, led by the attending physician.  Recommendations may be updated based on patient status, additional functional criteria and insurance authorization.  Follow Up Recommendations No PT follow up      Assistance Recommended at Discharge Intermittent Supervision/Assistance  Patient can return home with the following  Assist for transportation    Equipment Recommendations None recommended by PT     Functional Status Assessment Patient has not had a recent decline in their functional status     Precautions / Restrictions Restrictions Weight Bearing Restrictions: No      Mobility  Bed Mobility Overal bed mobility: Independent                  Transfers Overall transfer level: Independent                      Ambulation/Gait Ambulation/Gait assistance: Independent Gait Distance (Feet): 20 Feet Assistive device: None Gait Pattern/deviations: WFL(Within Functional Limits), Step-through pattern Gait velocity: WNL Gait velocity interpretation: <1.8 ft/sec, indicate of risk for recurrent falls (due to ambulation in room)   General Gait Details: steady gait in room       Balance Overall balance assessment: Independent                                           Pertinent Vitals/Pain Pain Assessment Pain Assessment: No/denies pain    Home Living Family/patient expects to be discharged to:: Private residence Living Arrangements: Spouse/significant other Available Help at Discharge: Family Type of Home: House Home Access: Stairs to enter Entrance Stairs-Rails: Can reach both;Left;Right Entrance Stairs-Number of Steps: 3   Home Layout: One level Home Equipment: Cane - single Barista (2 wheels)      Prior Function Prior Level of Function : Independent/Modified Independent             Mobility Comments: walks 1.5 mi per day ADLs Comments: mows lawn, weedeats and blows off the driveway in addition to everything else        Extremity/Trunk Assessment   Upper Extremity Assessment Upper Extremity Assessment: Overall WFL for tasks assessed    Lower Extremity Assessment Lower Extremity Assessment: LLE deficits/detail LLE Deficits / Details: L THA 7/31, ROM WFL strength grossly 3+/5       Communication   Communication: HOH (wears hearing aides)  Cognition Arousal/Alertness: Awake/alert Behavior During Therapy: WFL for tasks assessed/performed Overall Cognitive Status: Within Functional Limits for tasks assessed  General Comments General comments (skin integrity, edema, etc.): SpO2 with ambulation on RA 96%O2, HR 68 bpm    Exercises General Exercises - Lower Extremity Long Arc Quad: AROM, 10 reps, Seated Hip ABduction/ADduction: AROM, Both, 10 reps, Seated Hip Flexion/Marching: AROM, Both, 10 reps, Seated   Assessment/Plan    PT Assessment Patient does not need any further PT services         PT Goals (Current goals can be found in the Care Plan section)  Acute Rehab PT Goals Patient Stated Goal: get back home PT Goal Formulation: All  assessment and education complete, DC therapy     AM-PAC PT "6 Clicks" Mobility  Outcome Measure Help needed turning from your back to your side while in a flat bed without using bedrails?: None Help needed moving from lying on your back to sitting on the side of a flat bed without using bedrails?: None Help needed moving to and from a bed to a chair (including a wheelchair)?: None Help needed standing up from a chair using your arms (e.g., wheelchair or bedside chair)?: None Help needed to walk in hospital room?: None Help needed climbing 3-5 steps with a railing? : None 6 Click Score: 24    End of Session   Activity Tolerance: Patient tolerated treatment well Patient left: in chair;with call bell/phone within reach Nurse Communication: Mobility status;Other (comment) (no chair alarm to allow to go to bathroom as needed, independent with gait)      Time: 6761-9509 PT Time Calculation (min) (ACUTE ONLY): 21 min   Charges:   PT Evaluation $PT Eval Low Complexity: 1 Low          Deantre Bourdon B. Migdalia Dk PT, DPT Acute Rehabilitation Services Please use secure chat or  Call Office [phone]   [place] 10/08/2021, 1:19 PM

## 2021-10-08 NOTE — TOC Initial Note (Signed)
Transition of Care [facility]) - Initial/Assessment Note    Patient Details  Name: Trevor Schmidt MRN: [id number] Date of Birth: 1940-03-12  Transition of Care [facility]) CM/SW Contact:    Tom-Johnson, Renea Ee, RN Phone Number: 10/08/2021, 2:06 PM  Clinical Narrative:                  Patient is admitted for Covid virus infection. Currently on room air.  From home with wife. No PT f/u noted.  CM will continue to follow as patient progresses with care.   Expected Discharge Plan: Home/Self Care Barriers to Discharge: Continued Medical Work up   Patient Goals and CMS Choice Patient states their goals for this hospitalization and ongoing recovery are:: To return home CMS Medicare.gov Compare Post Acute Care list provided to:: Patient Choice offered to / list presented to : NA  Expected Discharge Plan and Services Expected Discharge Plan: Home/Self Care   Discharge Planning Services: CM Consult Post Acute Care Choice: NA Living arrangements for the past 2 months: Single Family Home                 DME Arranged: N/A DME Agency: NA       HH Arranged: NA HH Agency: NA        Prior Living Arrangements/Services Living arrangements for the past 2 months: Single Family Home Lives with:: Spouse Patient language and need for interpreter reviewed:: Yes Do you feel safe going back to the place where you live?: Yes      Need for Family Participation in Patient Care: Yes (Comment) Care giver support system in place?: Yes (comment)   Criminal Activity/Legal Involvement Pertinent to Current Situation/Hospitalization: No - Comment as needed  Activities of Daily Living      Permission Sought/Granted Permission sought to share information with : Case Manager, Family Supports Permission granted to share information with : Yes, Verbal Permission Granted              Emotional Assessment Appearance:: Appears stated age Attitude/Demeanor/Rapport: Engaged, Gracious Affect  (typically observed): Accepting, Appropriate, Calm, Hopeful, Pleasant Orientation: : Oriented to Self, Oriented to Place, Oriented to  Time, Oriented to Situation Alcohol / Substance Use: Not Applicable Psych Involvement: No (comment)  Admission diagnosis:  COVID [U07.1] COVID-19 virus infection [U07.1] Patient Active Problem List   Diagnosis Date Noted   COVID-19 virus infection 10/07/2021   SIRS (systemic inflammatory response syndrome) ([place]) 10/07/2021   AKI (acute kidney injury) ([place]) 10/07/2021   Generalized weakness 10/07/2021   HFrEF (heart failure with reduced ejection fraction) ([place]) 10/07/2021   S/P VP shunt 10/07/2021   Thrombocytopenia ([place]) 10/07/2021   Status post total replacement of left hip 08/30/2021   Primary osteoarthritis of left hip 07/09/2021   Degenerative joint disease (DJD) of hip 07/01/2021   Chest pain 06/22/2019   Cardiomyopathy, ischemic 07/13/2015   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           

## 2021-10-08 NOTE — Progress Notes (Addendum)
  Progress Note Patient: Trevor Schmidt QGB:[id number] DOB: 1940-08-06 DOA: 10/07/2021  DOS: the patient was seen and examined on 10/08/2021  Brief hospital course: PMH of HTN, HLD, HFrEF, CAD, VP shunt present to the hospital with complaints of generalized weakness found to have COVID-19 infection. Currently being treated with molnupiravir.  CRP elevated significantly therefore monitoring closely. Assessment and Plan: Sepsis secondary to COVID-19 virus infection Acute bacterial pharyngitis Presented with complaints of fever, tachycardia and tachypnea meeting SIRS criteria on admission. COVID-positive. Chest x-ray negative for any acute abnormality. Currently on room air right now. Pro-Cal minimally elevated. CRP significantly elevated. On molnupiravir since admission which I will continue. I will add azithromycin as the Pro-Cal is elevated.  Acute kidney injury. Renal function improving function baseline of 1.04.  On admission 1.42. Hold IV fluids.  Generalized fatigue. In the setting of COVID-19 infection. PT OT recommendation.  Chronic HFrEF. HTN EF 30 to 35%. Patient is on diuretics at home which is currently on hold. Also on Entresto currently on hold.  CAD. On aspirin and statin which we will continue.  History of cerebral aneurysm. S/p VP shunt. CT of the head was performed at the time of admission due to generalized fatigue. Does not show any evidence of acute abnormality but does show progressive cerebral atrophy with dilated ventricle size.  Thrombocytopenia. Most likely in reaction to COVID-19 infection. Monitor for now.  BPH. Continue home regimen.  Subjective: No nausea no vomiting no fever no chills.  Breathing okay.  Reports sore throat.  Physical Exam: Vitals:   10/08/21 0405 10/08/21 0418 10/08/21 0937 10/08/21 1709  BP:  (!) 126/56 (!) 103/49 135/68  Pulse:  (!) 48 (!) 52 (!) 54  Resp:  '18 19 16  '$ Temp:  98.3 F (36.8 C) 99.1 F (37.3 C) 99.8 F  (37.7 C)  TempSrc:  Oral Oral Oral  SpO2:  95% 94% 93%  Weight: 92.1 kg     Height:       General: Appear in mild distress; no visible Abnormal Neck Mass Or lumps, Conjunctiva normal Cardiovascular: S1 and S2 Present, no Murmur, Respiratory: good respiratory effort, Bilateral Air entry present and CTA, no Crackles, no wheezes Abdomen: Bowel Sound present, Non tender  Extremities: no Pedal edema Neurology: alert and oriented to time, place, and person  Gait not checked due to patient safety concerns   Data Reviewed: I have Reviewed nursing notes, Vitals, and Lab results since pt's last encounter. Pertinent lab results CBC, CMP, CRP I have ordered test including CBC, CMP, CRP    Family Communication: No family at bedside  Disposition: Status is: Inpatient Remains inpatient appropriate because: Requires close monitoring secondary to high CRP and hypotension with AKI and sepsis-like physiology with COVID-19 infection.  Author: Berle Mull, MD 10/08/2021 5:43 PM  Please look on [email or web address] to find out who is on call.

## 2021-10-09 ENCOUNTER — Inpatient Hospital Stay (HOSPITAL_COMMUNITY): Payer: PPO

## 2021-10-09 DIAGNOSIS — U071 COVID-19: Secondary | ICD-10-CM | POA: Diagnosis not present

## 2021-10-09 LAB — CBC WITH DIFFERENTIAL/PLATELET
Abs Immature Granulocytes: 0 10*3/uL (ref 0.00–0.07)
Basophils Absolute: 0.1 10*3/uL (ref 0.0–0.1)
Basophils Relative: 1 %
Eosinophils Absolute: 0.3 10*3/uL (ref 0.0–0.5)
Eosinophils Relative: 3 %
HCT: 36.9 % — ABNORMAL LOW (ref 39.0–52.0)
Hemoglobin: 13.6 g/dL (ref 13.0–17.0)
Lymphocytes Relative: 10 %
Lymphs Abs: 1.1 10*3/uL (ref 0.7–4.0)
MCH: 34.5 pg — ABNORMAL HIGH (ref 26.0–34.0)
MCHC: 36.9 g/dL — ABNORMAL HIGH (ref 30.0–36.0)
MCV: 93.7 fL (ref 80.0–100.0)
Monocytes Absolute: 0.7 10*3/uL (ref 0.1–1.0)
Monocytes Relative: 6 %
Neutro Abs: 9 10*3/uL — ABNORMAL HIGH (ref 1.7–7.7)
Neutrophils Relative %: 80 %
Platelets: 166 10*3/uL (ref 150–400)
RBC: 3.94 MIL/uL — ABNORMAL LOW (ref 4.22–5.81)
RDW: 12.3 % (ref 11.5–15.5)
WBC: 11.3 10*3/uL — ABNORMAL HIGH (ref 4.0–10.5)
nRBC: 0 % (ref 0.0–0.2)
nRBC: 0 /100 WBC

## 2021-10-09 LAB — COMPREHENSIVE METABOLIC PANEL
ALT: 43 U/L (ref 0–44)
AST: 31 U/L (ref 15–41)
Albumin: 2.5 g/dL — ABNORMAL LOW (ref 3.5–5.0)
Alkaline Phosphatase: 58 U/L (ref 38–126)
Anion gap: 14 (ref 5–15)
BUN: 9 mg/dL (ref 8–23)
CO2: 19 mmol/L — ABNORMAL LOW (ref 22–32)
Calcium: 8.6 mg/dL — ABNORMAL LOW (ref 8.9–10.3)
Chloride: 103 mmol/L (ref 98–111)
Creatinine, Ser: 0.97 mg/dL (ref 0.61–1.24)
GFR, Estimated: >60 mL/min (ref >60)
Glucose, Bld: 181 mg/dL — ABNORMAL HIGH (ref 70–99)
Potassium: 3.3 mmol/L — ABNORMAL LOW (ref 3.5–5.1)
Sodium: 136 mmol/L (ref 135–145)
Total Bilirubin: 0.9 mg/dL (ref 0.3–1.2)
Total Protein: 6 g/dL — ABNORMAL LOW (ref 6.5–8.1)

## 2021-10-09 LAB — MAGNESIUM: Magnesium: 2.1 mg/dL (ref 1.7–2.4)

## 2021-10-09 LAB — PROCALCITONIN: Procalcitonin: 0.52 ng/mL

## 2021-10-09 LAB — D-DIMER, QUANTITATIVE: D-Dimer, Quant: 1.07 ug/mL-FEU — ABNORMAL HIGH (ref 0.00–0.50)

## 2021-10-09 LAB — C-REACTIVE PROTEIN
CRP: 24.1 mg/dL — ABNORMAL HIGH (ref <1.0)
CRP: 5.3 mg/dL — ABNORMAL HIGH (ref <1.0)

## 2021-10-09 MED ORDER — POTASSIUM CHLORIDE CRYS ER 20 MEQ PO TBCR
40.0000 meq | EXTENDED_RELEASE_TABLET | ORAL | Status: AC
  Administered 2021-10-09 (×2): 40 meq via ORAL
  Filled 2021-10-09 (×2): qty 2

## 2021-10-09 MED ORDER — METHYLPREDNISOLONE SODIUM SUCC 40 MG IJ SOLR
40.0000 mg | Freq: Two times a day (BID) | INTRAMUSCULAR | Status: DC
  Administered 2021-10-09: 40 mg via INTRAVENOUS
  Filled 2021-10-09: qty 1

## 2021-10-09 NOTE — Progress Notes (Signed)
  Progress Note Patient: Trevor Schmidt:[id number] DOB: 17-May-1940 DOA: 10/07/2021  DOS: the patient was seen and examined on 10/09/2021  Brief hospital course: PMH of HTN, HLD, HFrEF, CAD, VP shunt present to the hospital with complaints of generalized weakness found to have COVID-19 infection. Currently being treated with molnupiravir.  CRP elevated significantly therefore monitoring closely. Assessment and Plan: Sepsis secondary to COVID-19 virus infection Acute bacterial pharyngitis Presented with complaints of fever, tachycardia and tachypnea meeting SIRS criteria on admission. COVID-positive. Chest x-ray negative for any acute abnormality. Currently on room air right now. Pro-Cal minimally elevated. CRP trended from 7.8-24-5.2.  Most likely 24 value is a lab error although we initiated further work-up based on that with chest x-ray on 9/9 showing no significant infiltrate. On molnupiravir since admission which I will continue. I will add azithromycin as the Pro-Cal is elevated. Continue to monitor in the hospital to ensure stabilization of his respiratory status.   Acute kidney injury. Renal function improving function baseline of 1.04.  On admission 1.42. Hold IV fluids.   Generalized fatigue. In the setting of COVID-19 infection. PT OT recommendation.   Chronic HFrEF. HTN EF 30 to 35%. Patient is on diuretics at home which is currently on hold. Also on Entresto currently on hold.  Blood pressure gradually trending up.  Most likely will resume Entresto on 9/10.   CAD. On aspirin and statin which we will continue.   History of cerebral aneurysm. S/p VP shunt. CT of the head was performed at the time of admission due to generalized fatigue. Does not show any evidence of acute abnormality but does show progressive cerebral atrophy with dilated ventricle size.   Thrombocytopenia. Most likely in reaction to COVID-19 infection. Monitor for now.   BPH. Continue home  regimen.  Subjective: No nausea no vomiting.  Required some oxygen last night.  No fever or chills.  Has some cough.  Physical Exam: Vitals:   10/08/21 1709 10/08/21 2213 10/09/21 0921 10/09/21 1557  BP: 135/68 132/62 133/65 (!) 146/66  Pulse: (!) 54 (!) 52 (!) 55 (!) 57  Resp: '16 17 16 16  '$ Temp: 99.8 F (37.7 C) 98.5 F (36.9 C) 98 F (36.7 C) 98.4 F (36.9 C)  TempSrc: Oral  Oral Oral  SpO2: 93% 93% 95% 94%  Weight:      Height:       General: Appear in mild distress; no visible Abnormal Neck Mass Or lumps, Conjunctiva normal Cardiovascular: S1 and S2 Present, no Murmur, Respiratory: good respiratory effort, Bilateral Air entry present and  faint Crackles, no wheezes Abdomen: Bowel Sound present, Non tender  Extremities: no Pedal edema Neurology: alert and oriented to time, place, and person  Gait not checked due to patient safety concerns   Data Reviewed: I have Reviewed nursing notes, Vitals, and Lab results since pt's last encounter. Pertinent lab results CBC, BMP, CRP I have ordered test including CBC, CMP, CRP I have ordered imaging studies chest x-ray. I have independently visualized and interpreted imaging chest x-ray which showed no significant pneumonia or infiltrates.   Family Communication: Wife at bedside  Disposition: Status is: Inpatient Remains inpatient appropriate because: Requires close observation due to severely elevated CRP this morning  Author: Berle Mull, MD 10/09/2021 5:09 PM  Please look on [email or web address] to find out who is on call.

## 2021-10-10 DIAGNOSIS — U071 COVID-19: Secondary | ICD-10-CM | POA: Diagnosis not present

## 2021-10-10 LAB — CBC WITH DIFFERENTIAL/PLATELET
Abs Immature Granulocytes: 0.02 10*3/uL (ref 0.00–0.07)
Basophils Absolute: 0 10*3/uL (ref 0.0–0.1)
Basophils Relative: 0 %
Eosinophils Absolute: 0 10*3/uL (ref 0.0–0.5)
Eosinophils Relative: 0 %
HCT: 34.3 % — ABNORMAL LOW (ref 39.0–52.0)
Hemoglobin: 12.2 g/dL — ABNORMAL LOW (ref 13.0–17.0)
Immature Granulocytes: 1 %
Lymphocytes Relative: 18 %
Lymphs Abs: 0.6 10*3/uL — ABNORMAL LOW (ref 0.7–4.0)
MCH: 31.6 pg (ref 26.0–34.0)
MCHC: 35.6 g/dL (ref 30.0–36.0)
MCV: 88.9 fL (ref 80.0–100.0)
Monocytes Absolute: 0.3 10*3/uL (ref 0.1–1.0)
Monocytes Relative: 8 %
Neutro Abs: 2.3 10*3/uL (ref 1.7–7.7)
Neutrophils Relative %: 73 %
Platelets: 111 10*3/uL — ABNORMAL LOW (ref 150–400)
RBC: 3.86 MIL/uL — ABNORMAL LOW (ref 4.22–5.81)
RDW: 13 % (ref 11.5–15.5)
WBC: 3.2 10*3/uL — ABNORMAL LOW (ref 4.0–10.5)
nRBC: 0 % (ref 0.0–0.2)

## 2021-10-10 LAB — COMPREHENSIVE METABOLIC PANEL
ALT: 22 U/L (ref 0–44)
AST: 32 U/L (ref 15–41)
Albumin: 3.1 g/dL — ABNORMAL LOW (ref 3.5–5.0)
Alkaline Phosphatase: 84 U/L (ref 38–126)
Anion gap: 9 (ref 5–15)
BUN: 18 mg/dL (ref 8–23)
CO2: 20 mmol/L — ABNORMAL LOW (ref 22–32)
Calcium: 8.9 mg/dL (ref 8.9–10.3)
Chloride: 106 mmol/L (ref 98–111)
Creatinine, Ser: 0.94 mg/dL (ref 0.61–1.24)
GFR, Estimated: >60 mL/min (ref >60)
Glucose, Bld: 144 mg/dL — ABNORMAL HIGH (ref 70–99)
Potassium: 4 mmol/L (ref 3.5–5.1)
Sodium: 135 mmol/L (ref 135–145)
Total Bilirubin: 0.5 mg/dL (ref 0.3–1.2)
Total Protein: 6.2 g/dL — ABNORMAL LOW (ref 6.5–8.1)

## 2021-10-10 LAB — D-DIMER, QUANTITATIVE: D-Dimer, Quant: 1.13 ug/mL-FEU — ABNORMAL HIGH (ref 0.00–0.50)

## 2021-10-10 LAB — C-REACTIVE PROTEIN: CRP: 3.4 mg/dL — ABNORMAL HIGH (ref <1.0)

## 2021-10-10 LAB — MAGNESIUM: Magnesium: 1.8 mg/dL (ref 1.7–2.4)

## 2021-10-10 LAB — PROCALCITONIN: Procalcitonin: 0.37 ng/mL

## 2021-10-10 MED ORDER — MOLNUPIRAVIR EUA 200MG CAPSULE: 4.0000 | ORAL_CAPSULE | Freq: Two times a day (BID) | ORAL | 0 refills | Status: AC

## 2021-10-10 MED ORDER — ASCORBIC ACID 500 MG PO TABS: 500.0000 mg | ORAL_TABLET | Freq: Every day | ORAL | 0 refills | Status: AC

## 2021-10-10 MED ORDER — ZINC SULFATE 220 (50 ZN) MG PO CAPS: 220.0000 mg | ORAL_CAPSULE | Freq: Every day | ORAL | 0 refills | Status: DC

## 2021-10-10 MED ORDER — AZITHROMYCIN 500 MG PO TABS
500.0000 mg | ORAL_TABLET | Freq: Every day | ORAL | 0 refills | Status: AC
Start: 2021-10-10 — End: 2021-10-13

## 2021-10-10 NOTE — Progress Notes (Signed)
DISCHARGE NOTE HOME Jayzen L Hearn to be discharged Home per MD order. Discussed prescriptions and follow up appointments with the patient. Prescriptions given to patient; medication list explained in detail. Patient verbalized understanding.  Skin clean, dry and intact without evidence of skin break down, no evidence of skin tears noted. IV catheter discontinued intact. Site without signs and symptoms of complications. Dressing and pressure applied. Pt denies pain at the site currently. No complaints noted.  Patient free of lines, drains, and wounds.   An After Visit Summary (AVS) was printed and given to the patient. Patient escorted via wheelchair, and discharged home via private auto.  Izadora Roehr S Mark Benecke, RN

## 2021-10-10 NOTE — TOC Transition Note (Signed)
Transition of Care [facility]) - CM/SW Discharge Note   Patient Details  Name: Trevor Schmidt MRN: [id number] Date of Birth: 07/31/1940  Transition of Care [facility]) CM/SW Contact:  Bartholomew Crews, RN Phone Number: [phone] 10/10/2021, 12:08 PM   Clinical Narrative:     Patient to transition home today. PT eval indicated no PT f/u recommended. No TOC needs identified.   Final next level of care: Home/Self Care Barriers to Discharge: No Barriers Identified   Patient Goals and CMS Choice Patient states their goals for this hospitalization and ongoing recovery are:: To return home CMS Medicare.gov Compare Post Acute Care list provided to:: Patient Choice offered to / list presented to : NA  Discharge Placement                       Discharge Plan and Services   Discharge Planning Services: CM Consult Post Acute Care Choice: NA          DME Arranged: N/A DME Agency: NA       HH Arranged: NA HH Agency: NA        Social Determinants of Health (SDOH) Interventions     Readmission Risk Interventions     No data to display

## 2021-10-12 ENCOUNTER — Encounter: Payer: PPO | Admitting: Orthopaedic Surgery

## 2021-10-12 LAB — CULTURE, BLOOD (ROUTINE X 2)
Culture: NO GROWTH
Culture: NO GROWTH
Special Requests: ADEQUATE
Special Requests: ADEQUATE

## 2021-10-13 NOTE — Discharge Summary (Signed)
Physician Discharge Summary   Patient: Trevor Schmidt MRN: [id number] DOB: August 05, 1940  Admit date:     10/07/2021  Discharge date: 10/10/2021  Discharge Physician: Berle Mull  PCP: Deland Pretty, MD  Recommendations at discharge:  Follow up with PCP in 1 week.  Resume Entresto in 1 week.  Discharge Diagnoses: Principal Problem:   COVID-19 virus infection Active Problems:   SIRS (systemic inflammatory response syndrome) (HCC)   AKI (acute kidney injury) ([place])   Generalized weakness   HFrEF (heart failure with reduced ejection fraction) (HCC)   CAD in native artery   Essential hypertension   S/P VP shunt   BPH (benign prostatic hyperplasia)   Hyperlipidemia   Thrombocytopenia [facility])  Hospital Course: PMH of HTN, HLD, HFrEF, CAD, VP shunt present to the hospital with complaints of generalized weakness found to have COVID-19 infection. Currently being treated with molnupiravir.  CRP elevated significantly therefore monitoring closely. Assessment and Plan  Sepsis secondary to COVID-19 virus infection Acute bacterial pharyngitis Presented with complaints of fever, tachycardia and tachypnea meeting SIRS criteria on admission. COVID-positive. Chest x-ray negative for any acute abnormality. Currently on room air right now. Pro-Cal minimally elevated. CRP trended from 7.8-24-5.2.  Most likely 24 value is a lab error although we initiated further work-up based on that with chest x-ray on 9/9 showing no significant infiltrate. On molnupiravir since admission which I will continue. Add azithromycin as the Pro-Cal is elevated.   Acute kidney injury. Renal function improving baseline of 1.04.  On admission 1.42.   Generalized fatigue. In the setting of COVID-19 infection.  Improving with treatment.   Chronic HFrEF. HTN EF 30 to 35%. Patient is on diuretics at home which is currently on hold. Also on Entresto currently on hold.   Will initiate Norvasc at 5 mg continue Coreg and  Aldactone.  After 1 week most likely his blood pressure will be stable enough to reinitiate Entresto.   CAD. On aspirin and statin which we will continue.   History of cerebral aneurysm. S/p VP shunt. CT of the head was performed at the time of admission due to generalized fatigue. Does not show any evidence of acute abnormality but does show progressive cerebral atrophy with dilated ventricle size.   Thrombocytopenia. Most likely in reaction to COVID-19 infection. Monitor for now.   BPH. Continue home regimen.   Consultants:  none  Procedures performed:  none  DISCHARGE MEDICATION: Allergies as of 10/10/2021       Reactions   Buspirone Diarrhea   Ceftriaxone Rash        Medication List     STOP taking these medications    Entresto 97-103 MG Generic drug: sacubitril-valsartan   methocarbamol 750 MG tablet Commonly known as: Robaxin-750   ondansetron 4 MG tablet Commonly known as: Zofran   Potassium Chloride ER 20 MEQ Tbcr       TAKE these medications    amLODipine 5 MG tablet Commonly known as: NORVASC TAKE 1 TABLET BY MOUTH DAILY   ascorbic acid 500 MG tablet Commonly known as: VITAMIN C Take 1 tablet (500 mg total) by mouth daily.   aspirin EC 81 MG tablet Take 1 tablet (81 mg total) by mouth in the morning and at bedtime. To be taken after surgery to prevent  blood clots.  Swallow whole.   atorvastatin 40 MG tablet Commonly known as: LIPITOR Take 40 mg by mouth every evening.   azithromycin 500 MG tablet Commonly known as: ZITHROMAX Take 1 tablet (  500 mg total) by mouth daily for 3 days.   CALCIUM 600-D PO Take 1 tablet by mouth 2 (two) times daily.   carvedilol 6.25 MG tablet Commonly known as: COREG TAKE ONE TABLET BY MOUTH TWICE A DAY   citalopram 40 MG tablet Commonly known as: CELEXA TAKE 1 TABLET (40 MG TOTAL) BY MOUTH DAILY.   cyanocobalamin 500 MCG tablet Commonly known as: VITAMIN B12 Take 500 mcg by mouth every  Sunday.   docusate sodium 100 MG capsule Commonly known as: Colace Take 1 capsule (100 mg total) by mouth daily as needed.   finasteride 5 MG tablet Commonly known as: PROSCAR Take 5 mg by mouth daily.   magnesium oxide 400 MG tablet Commonly known as: MAG-OX Take 400 mg by mouth every evening.   OCUVITE-LUTEIN PO Take 2 capsules by mouth daily.   spironolactone 25 MG tablet Commonly known as: ALDACTONE Take 0.5 tablets (12.5 mg total) by mouth daily.   zinc sulfate 220 (50 Zn) MG capsule Take 1 capsule (220 mg total) by mouth daily.   zolpidem 5 MG tablet Commonly known as: AMBIEN Take 5 mg by mouth at bedtime.       ASK your doctor about these medications    molnupiravir EUA 200 mg Caps capsule Commonly known as: LAGEVRIO Take 4 capsules (800 mg total) by mouth 2 (two) times daily for 5 days. Ask about: Should I take this medication?       Disposition: Home Diet recommendation: Cardiac diet  Discharge Exam: Vitals:   10/09/21 2101 10/10/21 0108 10/10/21 0602 10/10/21 0902  BP: 138/66  127/61 126/63  Pulse: (!) 51 66 (!) 55 (!) 51  Resp: '19 18 18 19  '$ Temp: 97.9 F (36.6 C)  97.7 F (36.5 C) (!) 97.5 F (36.4 C)  TempSrc: Oral  Oral Oral  SpO2: 99% 96% 98% 97%  Weight:      Height:       General: Appear in no distress; no visible Abnormal Neck Mass Or lumps, Conjunctiva normal Cardiovascular: S1 and S2 Present, no Murmur, Respiratory: good respiratory effort, Bilateral Air entry present and CTA, no Crackles, no wheezes Abdomen: Bowel Sound present, Non tender  Extremities: no Pedal edema Neurology: alert and oriented to time, place, and person  Filed Weights   10/07/21 1133 10/08/21 0405  Weight: 92.1 kg 92.1 kg   Condition at discharge: stable  The results of significant diagnostics from this hospitalization (including imaging, microbiology, ancillary and laboratory) are listed below for reference.   Imaging Studies: DG CHEST PORT 1  VIEW  Result Date: 10/09/2021 CLINICAL DATA:  Shortness of breath EXAM: PORTABLE CHEST 1 VIEW COMPARISON:  10/07/2021 FINDINGS: Transverse diameter of heart is increased. There are no signs of pulmonary edema or focal pulmonary consolidation. There is no pleural effusion or pneumothorax. Ventriculoperitoneal shunt is noted in right chest. IMPRESSION: Cardiomegaly. There are no signs of pulmonary edema or focal pulmonary consolidation. Electronically Signed   By: Elmer Picker M.D.   On: 10/09/2021 12:21   CT Head Wo Contrast  Result Date: 10/07/2021 CLINICAL DATA:  Mental status change, unknown cause EXAM: CT HEAD WITHOUT CONTRAST TECHNIQUE: Contiguous axial images were obtained from the base of the skull through the vertex without intravenous contrast. RADIATION DOSE REDUCTION: This exam was performed according to the departmental dose-optimization program which includes automated exposure control, adjustment of the mA and/or kV according to patient size and/or use of iterative reconstruction technique. COMPARISON:  MRI head 01/27/2005. FINDINGS: Brain:  Ventricular size is mildly increased relative to remote MRI from 2006, but this may be due to progressive cerebral atrophy. No overt hydrocephalus. Right frontal approach ventriculostomy catheter with the tip near the foramen of [place]. Visualized catheter is intact. No evidence of acute large vascular territory infarct. Moderate patchy white matter hypodensities, nonspecific but compatible with chronic microvascular ischemic disease. No evidence of acute hemorrhage, mass lesion, midline shift. Vascular: Embolization coils, probably in the region of the anterior communicating artery. No hyperdense vessel identified. Calcific intracranial atherosclerosis. Skull: No acute fracture. Right frontal burr hole. Sinuses/Orbits: Largely clear sinuses.  No acute orbital findings. Other: No mastoid effusions. IMPRESSION: 1. Ventricular size is mildly increased  relative to remote MRI from 2006, but this may be due to progressive cerebral atrophy. No overt hydrocephalus. 2. Chronic microvascular ischemic disease. Electronically Signed   By: Margaretha Sheffield M.D.   On: 10/07/2021 14:12   DG Chest Port 1 View  Result Date: 10/07/2021 CLINICAL DATA:  Questionable sepsis EXAM: PORTABLE CHEST 1 VIEW COMPARISON:  06/22/2019 FINDINGS: Dystrophic calcification around a right-sided chest shown catheter. Interstitial coarsening that is stable. There is no edema, consolidation, effusion, or pneumothorax. Borderline heart size and right hilar prominence that is also stable. IMPRESSION: Stable from 2021.  No acute finding. Electronically Signed   By: Jorje Guild M.D.   On: 10/07/2021 12:19    Microbiology: Results for orders placed or performed during the hospital encounter of 10/07/21  Blood Culture (routine x 2)     Status: None   Collection Time: 10/07/21 11:46 AM   Specimen: BLOOD  Result Value Ref Range Status   Specimen Description BLOOD RIGHT ANTECUBITAL  Final   Special Requests   Final    BOTTLES DRAWN AEROBIC AND ANAEROBIC Blood Culture adequate volume   Culture   Final    NO GROWTH 5 DAYS Performed at [facility], [address] [place], [place] [address]    Report Status 10/12/2021 FINAL  Final  SARS Coronavirus 2 by RT PCR (hospital order, performed in [facility] hospital lab) *cepheid single result test* Anterior Nasal Swab     Status: Abnormal   Collection Time: 10/07/21 11:47 AM   Specimen: Anterior Nasal Swab  Result Value Ref Range Status   SARS Coronavirus 2 by RT PCR POSITIVE (A) NEGATIVE Final    Comment: (NOTE) SARS-CoV-2 target nucleic acids are DETECTED  SARS-CoV-2 RNA is generally detectable in upper respiratory specimens  during the acute phase of infection.  Positive results are indicative  of the presence of the identified virus, but do not rule out bacterial infection or co-infection with other pathogens  not detected by the test.  Clinical correlation with patient history and  other diagnostic information is necessary to determine patient infection status.  The expected result is negative.  Fact Sheet for Patients:   https://www.[employer].info/   Fact Sheet for Healthcare Providers:   https://hall.com/    This test is not yet approved or cleared by the [place] FDA and  has been authorized for detection and/or diagnosis of SARS-CoV-2 by FDA under an Emergency Use Authorization (EUA).  This EUA will remain in effect (meaning this test can be used) for the duration of  the COVID-19 declaration under Section 564(b)(1)  of the Act, 21 U.S.C. section 360-bbb-3(b)(1), unless the authorization is terminated or revoked sooner.   Performed at [facility], [place] [address], [place],  [address]   Blood Culture (routine x 2)  Status: None   Collection Time: 10/07/21 11:51 AM   Specimen: BLOOD LEFT HAND  Result Value Ref Range Status   Specimen Description BLOOD LEFT HAND  Final   Special Requests   Final    BOTTLES DRAWN AEROBIC ONLY Blood Culture adequate volume   Culture   Final    NO GROWTH 5 DAYS Performed at [facility], [address], [place], [place] [address]    Report Status 10/12/2021 FINAL  Final   Labs: CBC: Recent Labs  Lab 10/07/21 1135 10/08/21 0907 10/09/21 0845 10/10/21 0301  WBC 6.3 6.8 11.3* 3.2*  NEUTROABS 4.8 4.5 9.0* 2.3  HGB 12.0* 11.1* 13.6 12.2*  HCT 35.3* 33.3* 36.9* 34.3*  MCV 93.6 92.8 93.7 88.9  PLT 114* 109* 166 109*   Basic Metabolic Panel: Recent Labs  Lab 10/07/21 1135 10/07/21 1611 10/08/21 0907 10/09/21 0845 10/10/21 0301  NA 134*  --  135 136 135  K 3.9  --  3.4* 3.3* 4.0  CL 106  --  106 103 106  CO2 21*  --  19* 19* 20*  GLUCOSE 108*  --  114* 181* 144*  BUN 22  --  '21 9 18  '$ CREATININE 1.42*  --  1.26* 0.97 0.94  CALCIUM 8.3*  --  8.2* 8.6* 8.9  MG  --  2.2  1.8 2.1 1.8  PHOS  --  4.0  --   --   --    Liver Function Tests: Recent Labs  Lab 10/07/21 1135 10/08/21 0907 10/09/21 0845 10/10/21 0301  AST 24 39 31 32  ALT 19 22 43 22  ALKPHOS 109 82 58 84  BILITOT 0.8 0.6 0.9 0.5  PROT 6.3* 5.8* 6.0* 6.2*  ALBUMIN 3.5 3.1* 2.5* 3.1*   CBG: No results for input(s): "GLUCAP" in the last 168 hours.  Discharge time spent: greater than 30 minutes.  Signed: Berle Mull, MD [facility] 10/10/2021

## 2021-10-22 ENCOUNTER — Encounter: Payer: Self-pay | Admitting: Orthopaedic Surgery

## 2021-10-22 ENCOUNTER — Ambulatory Visit (INDEPENDENT_AMBULATORY_CARE_PROVIDER_SITE_OTHER): Payer: PPO

## 2021-10-22 ENCOUNTER — Ambulatory Visit (INDEPENDENT_AMBULATORY_CARE_PROVIDER_SITE_OTHER): Payer: PPO | Admitting: Physician Assistant

## 2021-10-22 DIAGNOSIS — M1612 Unilateral primary osteoarthritis, left hip: Secondary | ICD-10-CM

## 2021-10-22 DIAGNOSIS — Z96642 Presence of left artificial hip joint: Secondary | ICD-10-CM

## 2021-10-22 MED ORDER — AMOXICILLIN 500 MG PO CAPS: ORAL_CAPSULE | ORAL | 2 refills | Status: AC

## 2021-10-22 NOTE — Progress Notes (Signed)
Post-Op Visit Note   Patient: Trevor Schmidt           Date of Birth: 11/25/1940           MRN: [id number] Visit Date: 10/22/2021 PCP: Deland Pretty, MD   Assessment & Plan:  Chief Complaint:  Chief Complaint  Patient presents with   Left Hip - Follow-up    Left total hip arthroplasty 08/30/2021   Visit Diagnoses:  1. Primary osteoarthritis of left hip   2. S/P total left hip arthroplasty     Plan: Patient is a pleasant 81 year old gentleman who comes in today 7 weeks status post left total hip replacement 08/30/2021.  He has been doing very well in regards to his hip.  He does note a recent stay in the hospital due to [place].  Left hip exam reveals painless logroll and hip flexion.  He is neurovascular intact distally.  At this point, he may continue to advance with activity as tolerated.  Follow-up with [place] in 5 weeks for repeat evaluation.  Call with concerns or questions at any time.  Follow-Up Instructions: Return in about 5 weeks (around 11/26/2021).   Orders:  Orders Placed This Encounter  Procedures   XR Pelvis 1-2 Views   No orders of the defined types were placed in this encounter.   Imaging: XR Pelvis 1-2 Views  Result Date: 10/22/2021 Well seated prosthesis without complication   PMFS History: Patient Active Problem List   Diagnosis Date Noted   COVID-19 virus infection 10/07/2021   SIRS (systemic inflammatory response syndrome) ([place]) 10/07/2021   AKI (acute kidney injury) ([place]) 10/07/2021   Generalized weakness 10/07/2021   HFrEF (heart failure with reduced ejection fraction) ([place]) 10/07/2021   S/P VP shunt 10/07/2021   Thrombocytopenia ([place]) 10/07/2021   Status post total replacement of left hip 08/30/2021   Primary osteoarthritis of left hip 07/09/2021   Degenerative joint disease (DJD) of hip 07/01/2021   Chest pain 06/22/2019   Cardiomyopathy, ischemic 07/13/2015   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   CAD in native artery     Hyperlipidemia LDL goal <70 11/28/2014   OSA on CPAP 11/28/2014   Hyperlipidemia 10/16/2014   LBBB (left bundle branch block) 10/16/2014   Mitral valve prolapse 10/07/2014   Essential hypertension 10/07/2014   BPH (benign prostatic hyperplasia) 10/07/2014   Past Medical History:  Diagnosis Date   Acute bronchitis 02/04/2015   Arthritis    Cancer (HCC)    bladder   Confusion    Depression    Dysrhythmia    LBBB   Edema    feet/ankles   GERD (gastroesophageal reflux disease)    HOH (hard of hearing)    aids   Hypertension    Kidney stones    MVP (mitral valve prolapse)    Prostate disease    Sleep apnea    CPAP    Family History  Problem Relation Age of Onset   Heart disease Mother    Prostate cancer Father    Healthy Maternal Grandmother    Healthy Maternal Grandfather    Healthy Paternal Grandmother    Healthy Paternal Grandfather     Past Surgical History:  Procedure Laterality Date   BACK SURGERY     BLADDER SURGERY     cancer   CARDIAC CATHETERIZATION N/A 12/09/2014   Procedure: Left Heart Cath and Coronary Angiography;  Surgeon: Troy Sine, MD;  Location: [place] [facility];  Service: Cardiovascular;  Laterality: N/A;  CATARACT EXTRACTION W/PHACO Right 09/08/2014   Procedure: CATARACT EXTRACTION PHACO AND INTRAOCULAR LENS PLACEMENT (IOC);  Surgeon: Estill Cotta, MD;  Location: [facility];  Service: Ophthalmology;  Laterality: Right;  US:1:06.3 AP%:23.3 CDE:24.97 Fluid lot# [id number]   CEREBRAL ANEURYSM REPAIR  2001   coil and shunt placement   CYSTOSCOPY     litho x 2   KNEE ARTHROSCOPY Right    TONSILLECTOMY     tonsils and adenoids removed as a child   TOTAL HIP ARTHROPLASTY Left 08/30/2021   Procedure: LEFT TOTAL HIP ARTHROPLASTY ANTERIOR APPROACH;  Surgeon: Leandrew Koyanagi, MD;  Location: [place];  Service: Orthopedics;  Laterality: Left;  3-C   VENTRICULOPERITONEAL SHUNT  2001   WRIST SURGERY Bilateral    ctr   Social History   Occupational  History   Occupation: Retired  Tobacco Use   Smoking status: Former    Types: Cigarettes    Quit date: 2003    Years since quitting: 20.7   Smokeless tobacco: Former    Types: Snuff    Quit date: 2022   Tobacco comments:    Chews Nicorette  Vaping Use   Vaping Use: Never used  Substance and Sexual Activity   Alcohol use: No   Drug use: No   Sexual activity: Not on file

## 2021-11-23 ENCOUNTER — Other Ambulatory Visit: Payer: Self-pay | Admitting: Cardiovascular Disease

## 2021-11-23 NOTE — Telephone Encounter (Signed)
Rx refill sent to pharmacy. 

## 2021-11-26 ENCOUNTER — Encounter: Payer: Self-pay | Admitting: Orthopaedic Surgery

## 2021-11-26 ENCOUNTER — Ambulatory Visit (INDEPENDENT_AMBULATORY_CARE_PROVIDER_SITE_OTHER): Payer: PPO | Admitting: Orthopaedic Surgery

## 2021-11-26 DIAGNOSIS — Z96642 Presence of left artificial hip joint: Secondary | ICD-10-CM

## 2021-11-26 NOTE — Progress Notes (Signed)
Post-Op Visit Note   Patient: Trevor Schmidt           Date of Birth: 07-01-1940           MRN: [id number] Visit Date: 11/26/2021 PCP: Deland Pretty, MD   Assessment & Plan:  Chief Complaint:  Chief Complaint  Patient presents with   Left Hip - Follow-up    Left total hip arthroplasty 08/30/2021   Visit Diagnoses:  1. S/P total left hip arthroplasty     Plan: [name] is 3 months status post left total hip replacement.  He is doing well overall.  Unfortunately he was admitted to the hospital for few days for COVID shortly after hip replacement.  He has done well.  He has no complaints regarding the hip.  He is very pleased with the outcome.  He has resumed baseline activities.  Examination left hip shows a fully healed surgical scar.  He has painless fluid range of motion.  Normal gait and ambulation.  [name] has done well from his surgery.  Dental prophylaxis reinforced.  Activity as tolerated.  Recheck in 3 months with standing AP pelvis and lateral hip x-rays.  Follow-Up Instructions: Return in about 3 months (around 02/26/2022).   Orders:  No orders of the defined types were placed in this encounter.  No orders of the defined types were placed in this encounter.   Imaging: No results found.  PMFS History: Patient Active Problem List   Diagnosis Date Noted   COVID-19 virus infection 10/07/2021   SIRS (systemic inflammatory response syndrome) ([place]) 10/07/2021   AKI (acute kidney injury) ([place]) 10/07/2021   Generalized weakness 10/07/2021   HFrEF (heart failure with reduced ejection fraction) ([place]) 10/07/2021   S/P VP shunt 10/07/2021   Thrombocytopenia ([place]) 10/07/2021   Status post total replacement of left hip 08/30/2021   Primary osteoarthritis of left hip 07/09/2021   Degenerative joint disease (DJD) of hip 07/01/2021   Chest pain 06/22/2019   Cardiomyopathy, ischemic 07/13/2015   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   CAD in native artery     Hyperlipidemia LDL goal <70 11/28/2014   OSA on CPAP 11/28/2014   Hyperlipidemia 10/16/2014   LBBB (left bundle branch block) 10/16/2014   Mitral valve prolapse 10/07/2014   Essential hypertension 10/07/2014   BPH (benign prostatic hyperplasia) 10/07/2014   Past Medical History:  Diagnosis Date   Acute bronchitis 02/04/2015   Arthritis    Cancer (HCC)    bladder   Confusion    Depression    Dysrhythmia    LBBB   Edema    feet/ankles   GERD (gastroesophageal reflux disease)    HOH (hard of hearing)    aids   Hypertension    Kidney stones    MVP (mitral valve prolapse)    Prostate disease    Sleep apnea    CPAP    Family History  Problem Relation Age of Onset   Heart disease Mother    Prostate cancer Father    Healthy Maternal Grandmother    Healthy Maternal Grandfather    Healthy Paternal Grandmother    Healthy Paternal Grandfather     Past Surgical History:  Procedure Laterality Date   BACK SURGERY     BLADDER SURGERY     cancer   CARDIAC CATHETERIZATION N/A 12/09/2014   Procedure: Left Heart Cath and Coronary Angiography;  Surgeon: Troy Sine, MD;  Location: [place];  Service: Cardiovascular;  Laterality: N/A;   CATARACT  EXTRACTION W/PHACO Right 09/08/2014   Procedure: CATARACT EXTRACTION PHACO AND INTRAOCULAR LENS PLACEMENT (IOC);  Surgeon: Estill Cotta, MD;  Location: [facility];  Service: Ophthalmology;  Laterality: Right;  US:1:06.3 AP%:23.3 CDE:24.97 Fluid lot# [id number] H   CEREBRAL ANEURYSM REPAIR  2001   coil and shunt placement   CYSTOSCOPY     litho x 2   KNEE ARTHROSCOPY Right    TONSILLECTOMY     tonsils and adenoids removed as a child   TOTAL HIP ARTHROPLASTY Left 08/30/2021   Procedure: LEFT TOTAL HIP ARTHROPLASTY ANTERIOR APPROACH;  Surgeon: Leandrew Koyanagi, MD;  Location: [place];  Service: Orthopedics;  Laterality: Left;  3-C   VENTRICULOPERITONEAL SHUNT  2001   WRIST SURGERY Bilateral    ctr   Social History   Occupational  History   Occupation: Retired  Tobacco Use   Smoking status: Former    Types: Cigarettes    Quit date: 2003    Years since quitting: 20.8   Smokeless tobacco: Former    Types: Snuff    Quit date: 2022   Tobacco comments:    Chews Nicorette  Vaping Use   Vaping Use: Never used  Substance and Sexual Activity   Alcohol use: No   Drug use: No   Sexual activity: Not on file

## 2021-11-29 ENCOUNTER — Other Ambulatory Visit: Payer: Self-pay | Admitting: Cardiovascular Disease

## 2021-12-16 ENCOUNTER — Ambulatory Visit: Payer: PPO | Admitting: Family Medicine

## 2021-12-16 ENCOUNTER — Ambulatory Visit (INDEPENDENT_AMBULATORY_CARE_PROVIDER_SITE_OTHER): Payer: PPO

## 2021-12-16 VITALS — BP 132/74 | HR 57 | Ht 71.0 in | Wt 203.0 lb

## 2021-12-16 DIAGNOSIS — M542 Cervicalgia: Secondary | ICD-10-CM

## 2021-12-16 DIAGNOSIS — G8929 Other chronic pain: Secondary | ICD-10-CM

## 2021-12-16 NOTE — Progress Notes (Signed)
   I, Peterson Lombard, LAT, ATC acting as a scribe for Lynne Leader, MD.  Trevor Schmidt is a 81 y.o. male who presents to [facility] at [facility] today for neck pain. Pt was previously seen by Dr. Georgina Snell on 07/01/21 for chronic L hip pain which was replaced by Dr. Erlinda Hong on 08/30/21. Today, pt c/o neck pain ongoing for a few months. Pt c/o pain waking him up in the middle of the night. Pt locates pain from the R occiput to the R-side of his neck. 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   Radiates: no UE Numbness/tingling: no UE Weakness: no Aggravates: at night, cervical motion Treatments tried: Aspercream, heat, ice,    Pertinent review of systems: No fevers or chills  Relevant historical information: Shunt   Exam:  BP 132/74   Pulse (!) 57   Ht '5\' 11"'$  (1.803 m)   Wt 203 lb (92.1 kg)   SpO2 97%   BMI 28.31 kg/m  General: Well Developed, well nourished, and in no acute distress.   MSK: C-spine: Visible subcutaneous shunt right lateral neck otherwise normal-appearing C-spine is nontender to palpation midline posterior neck Mildly tender palpation right cervical paraspinal musculature. Decree cervical motion.  Upper extremity strength is intact.    Lab and Radiology Results  X-ray images C-spine obtained today personally and independently interpreted Mild multilevel degenerative changes.  Intercerebral shot visible right lateral neck. Await formal radiology review     Assessment and Plan: 81 y.o. male with right lateral neck pain due to muscle spasm and dysfunction cervical paraspinal musculature.  I do not think this has anything to do with the shunt present in the lateral subcutaneous neck. He is a good candidate for physical therapy.  Plan to refer to PT.  Check back in about 6 weeks.   PDMP not reviewed this encounter. Orders Placed This Encounter  Procedures   DG Cervical Spine 2 or 3 views    Standing Status:   Future     Number of Occurrences:   1    Standing Expiration Date:   12/17/2022    Order Specific Question:   Reason for Exam (SYMPTOM  OR DIAGNOSIS REQUIRED)    Answer:   neck pain    Order Specific Question:   Preferred imaging location?    Answer:   [facility]   Ambulatory referral to Physical Therapy    Referral Priority:   Routine    Referral Type:   Physical Medicine    Referral Reason:   Specialty Services Required    Requested Specialty:   Physical Therapy    Number of Visits Requested:   1   No orders of the defined types were placed in this encounter.    Discussed warning signs or symptoms. Please see discharge instructions. Patient expresses understanding.   The above documentation has been reviewed and is accurate and complete Lynne Leader, M.D.

## 2021-12-16 NOTE — Patient Instructions (Addendum)
Thank you for coming in today.   Please get an Xray today before you leave   I've referred you to Physical Therapy.  Let us know if you don't hear from them in one week.   Try a heating pad.   Check back in 6 weeks.

## 2021-12-20 NOTE — Progress Notes (Signed)
Cervical spine x-ray shows arthritis changes within the neck.

## 2021-12-28 NOTE — Therapy (Signed)
OUTPATIENT PHYSICAL THERAPY CERVICAL EVALUATION   Patient Name: Trevor Schmidt MRN: [id number] DOB:July 09, 1940, 81 y.o., male Today's Date: 12/29/2021  END OF SESSION:  PT End of Session - 12/29/21 1057     Visit Number 1    Number of Visits 4    Date for PT Re-Evaluation 02/09/22    PT Start Time 0930    PT Stop Time 9518    PT Time Calculation (min) 45 min    Activity Tolerance Patient tolerated treatment well    Behavior During Therapy [facility] for tasks assessed/performed             Past Medical History:  Diagnosis Date   Acute bronchitis 02/04/2015   Arthritis    Cancer ([place])    bladder   Confusion    Depression    Dysrhythmia    LBBB   Edema    feet/ankles   GERD (gastroesophageal reflux disease)    HOH (hard of hearing)    aids   Hypertension    Kidney stones    MVP (mitral valve prolapse)    Prostate disease    Sleep apnea    CPAP   Past Surgical History:  Procedure Laterality Date   BACK SURGERY     BLADDER SURGERY     cancer   CARDIAC CATHETERIZATION N/A 12/09/2014   Procedure: Left Heart Cath and Coronary Angiography;  Surgeon: Troy Sine, MD;  Location: [place] [facility];  Service: Cardiovascular;  Laterality: N/A;   CATARACT EXTRACTION W/PHACO Right 09/08/2014   Procedure: CATARACT EXTRACTION PHACO AND INTRAOCULAR LENS PLACEMENT (IOC);  Surgeon: Estill Cotta, MD;  Location: [facility];  Service: Ophthalmology;  Laterality: Right;  US:1:06.3 AP%:23.3 CDE:24.97 Fluid lot# [id number] H   CEREBRAL ANEURYSM REPAIR  2001   coil and shunt placement   CYSTOSCOPY     litho x 2   KNEE ARTHROSCOPY Right    TONSILLECTOMY     tonsils and adenoids removed as a child   TOTAL HIP ARTHROPLASTY Left 08/30/2021   Procedure: LEFT TOTAL HIP ARTHROPLASTY ANTERIOR APPROACH;  Surgeon: Leandrew Koyanagi, MD;  Location: [place];  Service: Orthopedics;  Laterality: Left;  3-C   VENTRICULOPERITONEAL SHUNT  2001   WRIST SURGERY Bilateral    ctr   Patient Active  Problem List   Diagnosis Date Noted   COVID-19 virus infection 10/07/2021   SIRS (systemic inflammatory response syndrome) ([place]) 10/07/2021   AKI (acute kidney injury) ([place]) 10/07/2021   Generalized weakness 10/07/2021   HFrEF (heart failure with reduced ejection fraction) ([place]) 10/07/2021   S/P VP shunt 10/07/2021   Thrombocytopenia ([place]) 10/07/2021   Status post total replacement of left hip 08/30/2021   Primary osteoarthritis of left hip 07/09/2021   Degenerative joint disease (DJD) of hip 07/01/2021   Chest pain 06/22/2019   Cardiomyopathy, ischemic 07/13/2015   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   CAD in native artery    Hyperlipidemia LDL goal <70 11/28/2014   OSA on CPAP 11/28/2014   Hyperlipidemia 10/16/2014   LBBB (left bundle branch block) 10/16/2014   Mitral valve prolapse 10/07/2014   Essential hypertension 10/07/2014   BPH (benign prostatic hyperplasia) 10/07/2014    PCP: Deland Pretty, MD   REFERRING PROVIDER: Gregor Hams, MD  REFERRING DIAG: [phone] (ICD-10-CM) - Neck pain, chronic  THERAPY DIAG:  Cervicalgia  Abnormal posture  Rationale for Evaluation and Treatment: Rehabilitation  ONSET DATE: Chronic neck pain over one month  SUBJECTIVE:  SUBJECTIVE STATEMENT: He relays some chronic neck pain but became worse a month ago and mostly on Rt side. He does say that it has improved some recently. The pain is worse in the middle of the night when he rolls onto his Rt side. He feels this is a muscle strain  PERTINENT HISTORY:  PMH:s/p 7/31 L THA, hypertension, hyperlipidemia, HFrEF, CAD, and history of brain aneurysm s/p VP shunt  PAIN:  Are you having pain? Yes: NPRS scale:  /10 Pain location: neck right side Pain description: sore, tight,  muscle strain Aggravating factors: sleeping, turning head to left Relieving factors: advil  PRECAUTIONS: None  WEIGHT BEARING RESTRICTIONS: No  FALLS:  Has patient fallen in last 6 months? No  OCCUPATION: retired  PLOF: Independent  PATIENT GOALS: reduce neck pain  NEXT MD VISIT:   OBJECTIVE:   DIAGNOSTIC FINDINGS:  IMPRESSION: 1. Multilevel degenerative disc and facet disease most pronounced C4-5 and C5-6. 2. Left atypical pleuroparenchymal thickening.  PATIENT SURVEYS:  FOTO Eval 55% functional, goal is 62%  COGNITION: Overall cognitive status: Within functional limits for tasks assessed  SENSATION: WFL  POSTURE: rounded shoulders and forward head  PALPATION: Tender to palpation Cervical P.S, sub occipital, upper trap insertion on Rt side   CERVICAL ROM:  Eval: neck ROM limited by stiffness, does have pain turning to Rt side Active ROM AROM (deg) eval  Flexion 20  Extension 10  Right lateral flexion 15  Left lateral flexion 15  Right rotation 20  Left rotation 15   (Blank rows = not tested)  UPPER EXTREMITY ROM:  Active ROM Right eval Left eval  Shoulder flexion [facility]  Shoulder extension    Shoulder abduction [facility]  Shoulder adduction    Shoulder extension    Shoulder internal rotation    Shoulder external rotation    Elbow flexion    Elbow extension    Wrist flexion    Wrist extension    Wrist ulnar deviation    Wrist radial deviation    Wrist pronation    Wrist supination     (Blank rows = not tested)  UPPER EXTREMITY MMT:  MMT Right eval Left eval  Shoulder flexion 5 5  Shoulder extension    Shoulder abduction 5 5  Shoulder adduction    Shoulder extension    Shoulder internal rotation 5 5  Shoulder external rotation 5 5  Middle trapezius    Lower trapezius    Elbow flexion 5 5  Elbow extension 5 5  Neck strength grossly  4 4  Wrist extension    Wrist ulnar deviation    Wrist radial deviation    Wrist pronation     Wrist supination    Grip strength 5 5   (Blank rows = not tested)  CERVICAL SPECIAL TESTS:    FUNCTIONAL TESTS:    TODAY'S TREATMENT:  Eval HEP creation and review, see below for details IFC TENS X 15 min with moist heat to Rt cervical P.S. and upper trap Manual therapy for skilled palpation and Trigger Point Dry-Needling  Treatment instructions: Expect mild to moderate muscle soreness. Patient Consent Given: Yes Education handout provided: verbally provided Muscles treated: Rt Cervical paraspinals and multifidi, upper trap Treatment response/outcome: good overall tolerance,twitch response noted     PATIENT EDUCATION: Education details: HEP, PT plan of care Person educated: Patient Education method: Explanation, Demonstration, Verbal cues, and Handouts Education comprehension: verbalized understanding and needs further education   HOME EXERCISE PROGRAM: Access Code:  [email or web address] URL: [email or web address] Date: 12/29/2021 Prepared by: Elsie Ra  Exercises - Seated Cervical Retraction  - 2 x daily - 6 x weekly - 1 sets - 10 reps - Seated Levator Scapulae Stretch (Mirrored)  - 2 x daily - 6 x weekly - 3 sets - 30 hold - Seated Cervical Sidebending Stretch  - 2 x daily - 6 x weekly - 3 sets - 30 hold - Cervical Extension AROM with Strap  - 2 x daily - 6 x weekly - 1-2 sets - 10 reps - 5 hold - Seated Assisted Cervical Rotation with Towel  - 2 x daily - 6 x weekly - 1-2 sets - 10 reps - 5 hold - Standing Scapular Retraction with External Rotation  - 2 x daily - 6 x weekly - 1 sets - 10 reps - 5 hold  Patient Education - TENS Therapy  ASSESSMENT:  CLINICAL IMPRESSION: Patient referred to PT for chronic neck pain. This presents more as a muscle strain and he his severe tightness and hypomobility in his neck. Patient will benefit from skilled PT to address below impairments, limitations and improve overall function.  OBJECTIVE IMPAIRMENTS: decreased activity  tolerance, decreased neck mobility, decreased ROM, decreased neck strength, impaired flexibility, postural dysfunction, and pain.  ACTIVITY LIMITATIONS: sleeping, turning his head, driving  PERSONAL FACTORS: PMH:s/p 7/31 L THA, hypertension, hyperlipidemia, HFrEF, CAD, and history of brain aneurysm s/p VP shunt also affecting patient's functional outcome.  REHAB POTENTIAL: Good  CLINICAL DECISION MAKING: Stable/uncomplicated  EVALUATION COMPLEXITY: Low    GOALS: Short term PT Goals Target date: 01/26/2022    Pt will be I and compliant with HEP. Baseline:  Goal status: New Pt will decrease pain by 25% overall Baseline: Goal status: New  Long term PT goals Target date:02/09/2022   Pt will improve neck ROM to [facility] (>40 deg rotation) to improve functional scanning with head/eyes Baseline: Goal status: New Pt will improve FOTO to at least 62% functional to show improved function Baseline: Goal status: New Pt will reduce pain to overall less than 50% with sleeping and with usual activity and work activity. Baseline: Goal status: New  PLAN: PT FREQUENCY: 1 times per week   PT DURATION:6 weeks  PLANNED INTERVENTIONS (unless contraindicated): aquatic PT, Canalith repositioning, cryotherapy, Electrical stimulation, Iontophoresis with 4 mg/ml dexamethasome, Moist heat, traction, Ultrasound, gait training, Therapeutic exercise, balance training, neuromuscular re-education, patient/family education, prosthetic training, manual techniques, passive ROM, dry needling, taping, vasopnuematic device, vestibular, spinal manipulations, joint manipulations  PLAN FOR NEXT SESSION: review HEP, how was DN and TENS from last time and repeat if desired but be aware of shunt in Rt lateral neck and avoid that area.  Debbe Odea, PT,DPT 12/29/2021, 11:05 AM

## 2021-12-29 ENCOUNTER — Encounter: Payer: Self-pay | Admitting: Physical Therapy

## 2021-12-29 ENCOUNTER — Ambulatory Visit: Payer: PPO | Admitting: Physical Therapy

## 2021-12-29 ENCOUNTER — Other Ambulatory Visit: Payer: Self-pay

## 2021-12-29 DIAGNOSIS — M542 Cervicalgia: Secondary | ICD-10-CM

## 2021-12-29 DIAGNOSIS — R293 Abnormal posture: Secondary | ICD-10-CM | POA: Diagnosis not present

## 2022-01-12 ENCOUNTER — Encounter: Payer: Self-pay | Admitting: Physical Therapy

## 2022-01-12 ENCOUNTER — Ambulatory Visit: Payer: PPO | Admitting: Physical Therapy

## 2022-01-12 DIAGNOSIS — R293 Abnormal posture: Secondary | ICD-10-CM | POA: Diagnosis not present

## 2022-01-12 DIAGNOSIS — M542 Cervicalgia: Secondary | ICD-10-CM

## 2022-01-12 NOTE — Therapy (Signed)
OUTPATIENT PHYSICAL THERAPY TREATMENT   Patient Name: Trevor Schmidt MRN: [id number] DOB:Dec 10, 1940, 81 y.o., male Today's Date: 01/12/2022  END OF SESSION:  PT End of Session - 01/12/22 1048     Visit Number 2    Number of Visits 4    Date for PT Re-Evaluation 02/09/22    PT Start Time 1048    PT Stop Time 1130    PT Time Calculation (min) 42 min    Activity Tolerance Patient tolerated treatment well    Behavior During Therapy [facility] for tasks assessed/performed             Past Medical History:  Diagnosis Date   Acute bronchitis 02/04/2015   Arthritis    Cancer ([place])    bladder   Confusion    Depression    Dysrhythmia    LBBB   Edema    feet/ankles   GERD (gastroesophageal reflux disease)    HOH (hard of hearing)    aids   Hypertension    Kidney stones    MVP (mitral valve prolapse)    Prostate disease    Sleep apnea    CPAP   Past Surgical History:  Procedure Laterality Date   BACK SURGERY     BLADDER SURGERY     cancer   CARDIAC CATHETERIZATION N/A 12/09/2014   Procedure: Left Heart Cath and Coronary Angiography;  Surgeon: Troy Sine, MD;  Location: [place] [facility];  Service: Cardiovascular;  Laterality: N/A;   CATARACT EXTRACTION W/PHACO Right 09/08/2014   Procedure: CATARACT EXTRACTION PHACO AND INTRAOCULAR LENS PLACEMENT (IOC);  Surgeon: Estill Cotta, MD;  Location: [facility];  Service: Ophthalmology;  Laterality: Right;  US:1:06.3 AP%:23.3 CDE:24.97 Fluid lot# [id number]   CEREBRAL ANEURYSM REPAIR  2001   coil and shunt placement   CYSTOSCOPY     litho x 2   KNEE ARTHROSCOPY Right    TONSILLECTOMY     tonsils and adenoids removed as a child   TOTAL HIP ARTHROPLASTY Left 08/30/2021   Procedure: LEFT TOTAL HIP ARTHROPLASTY ANTERIOR APPROACH;  Surgeon: Leandrew Koyanagi, MD;  Location: [place];  Service: Orthopedics;  Laterality: Left;  3-C   VENTRICULOPERITONEAL SHUNT  2001   WRIST SURGERY Bilateral    ctr   Patient Active Problem List    Diagnosis Date Noted   COVID-19 virus infection 10/07/2021   SIRS (systemic inflammatory response syndrome) ([place]) 10/07/2021   AKI (acute kidney injury) ([place]) 10/07/2021   Generalized weakness 10/07/2021   HFrEF (heart failure with reduced ejection fraction) ([place]) 10/07/2021   S/P VP shunt 10/07/2021   Thrombocytopenia ([place]) 10/07/2021   Status post total replacement of left hip 08/30/2021   Primary osteoarthritis of left hip 07/09/2021   Degenerative joint disease (DJD) of hip 07/01/2021   Chest pain 06/22/2019   Cardiomyopathy, ischemic 07/13/2015   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   CAD in native artery    Hyperlipidemia LDL goal <70 11/28/2014   OSA on CPAP 11/28/2014   Hyperlipidemia 10/16/2014   LBBB (left bundle branch block) 10/16/2014   Mitral valve prolapse 10/07/2014   Essential hypertension 10/07/2014   BPH (benign prostatic hyperplasia) 10/07/2014    PCP: Deland Pretty, MD   REFERRING PROVIDER: Gregor Hams, MD  REFERRING DIAG: [phone] (ICD-10-CM) - Neck pain, chronic  THERAPY DIAG:  Cervicalgia  Abnormal posture  Rationale for Evaluation and Treatment: Rehabilitation  ONSET DATE: Chronic neck pain over one month  SUBJECTIVE:  SUBJECTIVE STATEMENT: He relays he was feeling better, has been doing his exercises but woke up this morning with more pain. He typically gets pain at night with rolling onto his Rt side.  PERTINENT HISTORY:  PMH:s/p 7/31 L THA, hypertension, hyperlipidemia, HFrEF, CAD, and history of brain aneurysm s/p VP shunt  PAIN:  Are you having pain? Yes: NPRS scale: 5/10 Pain location: neck right side Pain description: sore, tight, muscle strain Aggravating factors: sleeping, turning head to left Relieving factors:  advil  PRECAUTIONS: None  WEIGHT BEARING RESTRICTIONS: No  FALLS:  Has patient fallen in last 6 months? No  OCCUPATION: retired  PLOF: Independent  PATIENT GOALS: reduce neck pain  NEXT MD VISIT:   OBJECTIVE:   DIAGNOSTIC FINDINGS:  IMPRESSION: 1. Multilevel degenerative disc and facet disease most pronounced C4-5 and C5-6. 2. Left atypical pleuroparenchymal thickening.  PATIENT SURVEYS:  FOTO Eval 55% functional, goal is 62%  COGNITION: Overall cognitive status: Within functional limits for tasks assessed  SENSATION: WFL  POSTURE: rounded shoulders and forward head  PALPATION: Tender to palpation Cervical P.S, sub occipital, upper trap insertion on Rt side   CERVICAL ROM:  Eval: neck ROM limited by stiffness, does have pain turning to Rt side Active ROM AROM (deg) eval  Flexion 20  Extension 10  Right lateral flexion 15  Left lateral flexion 15  Right rotation 20  Left rotation 15   (Blank rows = not tested)  UPPER EXTREMITY ROM:  Active ROM Right eval Left eval  Shoulder flexion [facility]  Shoulder extension    Shoulder abduction [facility]  Shoulder adduction    Shoulder extension    Shoulder internal rotation    Shoulder external rotation    Elbow flexion    Elbow extension    Wrist flexion    Wrist extension    Wrist ulnar deviation    Wrist radial deviation    Wrist pronation    Wrist supination     (Blank rows = not tested)  UPPER EXTREMITY MMT:  MMT Right eval Left eval  Shoulder flexion 5 5  Shoulder extension    Shoulder abduction 5 5  Shoulder adduction    Shoulder extension    Shoulder internal rotation 5 5  Shoulder external rotation 5 5  Middle trapezius    Lower trapezius    Elbow flexion 5 5  Elbow extension 5 5  Neck strength grossly  4 4  Wrist extension    Wrist ulnar deviation    Wrist radial deviation    Wrist pronation    Wrist supination    Grip strength 5 5   (Blank rows = not tested)  CERVICAL  SPECIAL TESTS:    FUNCTIONAL TESTS:    TODAY'S TREATMENT:  01/12/22 Recommendation to try side sleeping with a bigger pillow or 2 pillows HEP review with cues on how to modify  Upper trap stretch 5 sec X 10 bilat with self overpressure Cervical rotation with self overpressure 5 sec X 10 bilat Chin tucks X 10 Scapular retraction X10 IFC TENS X 15 min with moist heat to Rt cervical P.S. and upper trap Manual therapy for skilled palpation and Trigger Point Dry-Needling  Treatment instructions: Expect mild to moderate muscle soreness. Patient Consent Given: Yes Education handout provided: verbally provided Muscles treated: Rt Cervical paraspinals and multifidi, upper trap Treatment response/outcome: good overall tolerance,twitch response noted     PATIENT EDUCATION: Education details: HEP, PT plan of care Person educated: Patient Education method:  Explanation, Demonstration, Verbal cues, and Handouts Education comprehension: verbalized understanding and needs further education   HOME EXERCISE PROGRAM: Access Code: [email or web address] URL: [email or web address] Date: 12/29/2021 Prepared by: Elsie Ra  Exercises - Seated Cervical Retraction  - 2 x daily - 6 x weekly - 1 sets - 10 reps - Seated Levator Scapulae Stretch (Mirrored)  - 2 x daily - 6 x weekly - 3 sets - 30 hold - Seated Cervical Sidebending Stretch  - 2 x daily - 6 x weekly - 3 sets - 30 hold - Cervical Extension AROM with Strap  - 2 x daily - 6 x weekly - 1-2 sets - 10 reps - 5 hold - Seated Assisted Cervical Rotation with Towel  - 2 x daily - 6 x weekly - 1-2 sets - 10 reps - 5 hold - Standing Scapular Retraction with External Rotation  - 2 x daily - 6 x weekly - 1 sets - 10 reps - 5 hold  Patient Education - TENS Therapy  ASSESSMENT:  CLINICAL IMPRESSION: He has shown good HEP compliance, I did review this with him and provide cues not to pull too hard with his stretches. Continued with DN and TENS  therapy to reduce muscle pain/tension in his neck followed by neck stretching/mobility exercises. He will continue to benefit from skilled PT.   OBJECTIVE IMPAIRMENTS: decreased activity tolerance, decreased neck mobility, decreased ROM, decreased neck strength, impaired flexibility, postural dysfunction, and pain.  ACTIVITY LIMITATIONS: sleeping, turning his head, driving  PERSONAL FACTORS: PMH:s/p 7/31 L THA, hypertension, hyperlipidemia, HFrEF, CAD, and history of brain aneurysm s/p VP shunt also affecting patient's functional outcome.  REHAB POTENTIAL: Good  CLINICAL DECISION MAKING: Stable/uncomplicated  EVALUATION COMPLEXITY: Low    GOALS: Short term PT Goals Target date: 01/26/2022    Pt will be I and compliant with HEP. Baseline:  Goal status: New Pt will decrease pain by 25% overall Baseline: Goal status: New  Long term PT goals Target date:02/09/2022   Pt will improve neck ROM to [facility] (>40 deg rotation) to improve functional scanning with head/eyes Baseline: Goal status: New Pt will improve FOTO to at least 62% functional to show improved function Baseline: Goal status: New Pt will reduce pain to overall less than 50% with sleeping and with usual activity and work activity. Baseline: Goal status: New  PLAN: PT FREQUENCY: 1 times per week   PT DURATION:6 weeks  PLANNED INTERVENTIONS (unless contraindicated): aquatic PT, Canalith repositioning, cryotherapy, Electrical stimulation, Iontophoresis with 4 mg/ml dexamethasome, Moist heat, traction, Ultrasound, gait training, Therapeutic exercise, balance training, neuromuscular re-education, patient/family education, prosthetic training, manual techniques, passive ROM, dry needling, taping, vasopnuematic device, vestibular, spinal manipulations, joint manipulations  PLAN FOR NEXT SESSION: how was DN and TENS from last time and repeat if desired but be aware of shunt in Rt lateral neck and avoid that area. Continue  with neck mobility and stretching program.  Debbe Odea, PT,DPT 01/12/2022, 10:48 AM

## 2022-01-20 NOTE — Progress Notes (Signed)
   I, Peterson Lombard, LAT, ATC acting as a scribe for Lynne Leader, MD.  Trevor Schmidt is a 81 y.o. male who presents to [place] at [facility] today for 6-wk f/u neck pain.  Patient was last seen by Dr. Georgina Snell on 12/16/2021 and was referred to PT, completing 3 visits.  Today, patient reports neck pain is much improved. Today, he is not having any neck pain, but will have soreness. Pt notes the spasm in his neck have resolved.  Dx imaging: 12/16/2021 C-spine XR  Pertinent review of systems: No fevers or chills  Relevant historical information: Coronary artery disease.  Degenerative changes C-spine.   Exam:  BP 104/62   Pulse (!) 52   Ht '5\' 11"'$  (1.803 m)   Wt 203 lb (92.1 kg)   SpO2 95%   BMI 28.31 kg/m  General: Well Developed, well nourished, and in no acute distress.   MSK: C-spine: Decreased cervical motion.  Nontender. Extremity strength is intact.    Lab and Radiology Results  EXAM: CERVICAL SPINE - 2-3 VIEW   COMPARISON:  None Available.   FINDINGS: Visualization through C6 on lateral view. Normal straightening of the normal cervical lordosis. Multilevel degenerative changes most pronounced C4-5 and C5-6. Multilevel facet degenerative changes. Prevertebral soft tissues are unremarkable. Left apical pleuroparenchymal thickening. Lateral masses articulate appropriately with the dens. There are associated degenerative changes, most pronounced on the right.   IMPRESSION: 1. Multilevel degenerative disc and facet disease most pronounced C4-5 and C5-6. 2. Left apical pleuroparenchymal thickening.     Electronically Signed   By: Lovey Newcomer M.D.   On: 12/18/2021 06:53   I, Lynne Leader, personally (independently) visualized and performed the interpretation of the images attached in this note.    Assessment and Plan: 81 y.o. male with neck pain.  This severe exacerbation of neck pain that he had during his last visit was thought to be due to  muscle spasm and dysfunction.  That has significantly improved with physical therapy.  He does have some more milder pain now that thought to be more related to the degenerative changes seen on x-ray.  Plan for continued conservative management including home exercise program.  Check back with me as needed. Total encounter time 20 minutes including face-to-face time with the patient and, reviewing past medical record, and charting on the date of service.      Discussed warning signs or symptoms. Please see discharge instructions. Patient expresses understanding.   The above documentation has been reviewed and is accurate and complete Lynne Leader, M.D.

## 2022-01-21 ENCOUNTER — Encounter: Payer: Self-pay | Admitting: Physical Therapy

## 2022-01-21 ENCOUNTER — Ambulatory Visit: Payer: PPO | Admitting: Physical Therapy

## 2022-01-21 DIAGNOSIS — R293 Abnormal posture: Secondary | ICD-10-CM

## 2022-01-21 DIAGNOSIS — M542 Cervicalgia: Secondary | ICD-10-CM | POA: Diagnosis not present

## 2022-01-21 NOTE — Therapy (Signed)
OUTPATIENT PHYSICAL THERAPY TREATMENT/Discharge PHYSICAL THERAPY DISCHARGE SUMMARY  Visits from Start of Care: 3  Current functional level related to goals / functional outcomes: See below   Remaining deficits: See below   Education / Equipment: HEP  Plan:  Patient goals were  met. Patient is being discharged due to meeting PT goals      Patient Name: Trevor Schmidt MRN: [id number] DOB:06/04/1940, 81 y.o., male Today's Date: 01/21/2022  END OF SESSION:  PT End of Session - 01/21/22 1111     Visit Number 3    Number of Visits 4    Date for PT Re-Evaluation 02/09/22    PT Start Time 1100    PT Stop Time 1138    PT Time Calculation (min) 38 min    Activity Tolerance Patient tolerated treatment well    Behavior During Therapy [facility] for tasks assessed/performed             Past Medical History:  Diagnosis Date   Acute bronchitis 02/04/2015   Arthritis    Cancer ([place])    bladder   Confusion    Depression    Dysrhythmia    LBBB   Edema    feet/ankles   GERD (gastroesophageal reflux disease)    HOH (hard of hearing)    aids   Hypertension    Kidney stones    MVP (mitral valve prolapse)    Prostate disease    Sleep apnea    CPAP   Past Surgical History:  Procedure Laterality Date   BACK SURGERY     BLADDER SURGERY     cancer   CARDIAC CATHETERIZATION N/A 12/09/2014   Procedure: Left Heart Cath and Coronary Angiography;  Surgeon: Troy Sine, MD;  Location: [place] [facility];  Service: Cardiovascular;  Laterality: N/A;   CATARACT EXTRACTION W/PHACO Right 09/08/2014   Procedure: CATARACT EXTRACTION PHACO AND INTRAOCULAR LENS PLACEMENT (IOC);  Surgeon: Estill Cotta, MD;  Location: [facility];  Service: Ophthalmology;  Laterality: Right;  US:1:06.3 AP%:23.3 CDE:24.97 Fluid lot# [id number]   CEREBRAL ANEURYSM REPAIR  2001   coil and shunt placement   CYSTOSCOPY     litho x 2   KNEE ARTHROSCOPY Right    TONSILLECTOMY     tonsils and adenoids  removed as a child   TOTAL HIP ARTHROPLASTY Left 08/30/2021   Procedure: LEFT TOTAL HIP ARTHROPLASTY ANTERIOR APPROACH;  Surgeon: Leandrew Koyanagi, MD;  Location: [place];  Service: Orthopedics;  Laterality: Left;  3-C   VENTRICULOPERITONEAL SHUNT  2001   WRIST SURGERY Bilateral    ctr   Patient Active Problem List   Diagnosis Date Noted   COVID-19 virus infection 10/07/2021   SIRS (systemic inflammatory response syndrome) ([place]) 10/07/2021   AKI (acute kidney injury) ([place]) 10/07/2021   Generalized weakness 10/07/2021   HFrEF (heart failure with reduced ejection fraction) ([place]) 10/07/2021   S/P VP shunt 10/07/2021   Thrombocytopenia ([place]) 10/07/2021   Status post total replacement of left hip 08/30/2021   Primary osteoarthritis of left hip 07/09/2021   Degenerative joint disease (DJD) of hip 07/01/2021   Chest pain 06/22/2019   Cardiomyopathy, ischemic 07/13/2015   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   CAD in native artery    Hyperlipidemia LDL goal <70 11/28/2014   OSA on CPAP 11/28/2014   Hyperlipidemia 10/16/2014   LBBB (left bundle branch block) 10/16/2014   Mitral valve prolapse 10/07/2014   Essential hypertension 10/07/2014   BPH (benign prostatic hyperplasia) 10/07/2014  PCP: Deland Pretty, MD   REFERRING PROVIDER: Gregor Hams, MD  REFERRING DIAG: [phone] (ICD-10-CM) - Neck pain, chronic  THERAPY DIAG:  Cervicalgia  Abnormal posture  Rationale for Evaluation and Treatment: Rehabilitation  ONSET DATE: Chronic neck pain over one month  SUBJECTIVE:                                                                                                                                                                                                         SUBJECTIVE STATEMENT: He denies pain today, he feels ready to discharge and he will keep up with HEP.  PERTINENT HISTORY:  PMH:s/p 7/31 L THA, hypertension, hyperlipidemia, HFrEF, CAD, and history of brain  aneurysm s/p VP shunt  PAIN:  Are you having pain? Yes: NPRS scale: 0/10 Pain location: neck right side Pain description: sore, tight, muscle strain Aggravating factors: sleeping, turning head to left Relieving factors: advil  PRECAUTIONS: None  WEIGHT BEARING RESTRICTIONS: No  FALLS:  Has patient fallen in last 6 months? No  OCCUPATION: retired  PLOF: Independent  PATIENT GOALS: reduce neck pain  NEXT MD VISIT:   OBJECTIVE:   DIAGNOSTIC FINDINGS:  IMPRESSION: 1. Multilevel degenerative disc and facet disease most pronounced C4-5 and C5-6. 2. Left atypical pleuroparenchymal thickening.  PATIENT SURVEYS:  FOTO Eval 55% functional, goal is 62% 01/21/22: FOTO improved to 96% and met goal for this  COGNITION: Overall cognitive status: Within functional limits for tasks assessed  SENSATION: WFL  POSTURE: rounded shoulders and forward head  PALPATION: Tender to palpation Cervical P.S, sub occipital, upper trap insertion on Rt side   CERVICAL ROM:  Eval: neck ROM limited by stiffness, does have pain turning to Rt side Active ROM AROM (deg) eval 01/21/22  Flexion 20 30  Extension 10 30  Right lateral flexion 15 25  Left lateral flexion 15 25  Right rotation 20 40  Left rotation 15 40   (Blank rows = not tested)  UPPER EXTREMITY ROM:  Active ROM Right eval Left eval  Shoulder flexion [facility]  Shoulder extension    Shoulder abduction [facility]  Shoulder adduction    Shoulder extension    Shoulder internal rotation    Shoulder external rotation    Elbow flexion    Elbow extension    Wrist flexion    Wrist extension    Wrist ulnar deviation    Wrist radial deviation    Wrist pronation    Wrist supination     (Blank rows = not tested)  UPPER EXTREMITY MMT:  MMT  Right eval Left eval 01/21/22 R/L  Shoulder flexion 5 5   Shoulder extension     Shoulder abduction 5 5   Shoulder adduction     Shoulder extension     Shoulder internal rotation 5 5    Shoulder external rotation 5 5   Middle trapezius     Lower trapezius     Elbow flexion 5 5   Elbow extension 5 5   Neck strength grossly  4 4 5/5  Wrist extension     Wrist ulnar deviation     Wrist radial deviation     Wrist pronation     Wrist supination     Grip strength 5 5    (Blank rows = not tested)  CERVICAL SPECIAL TESTS:    FUNCTIONAL TESTS:    TODAY'S TREATMENT:  01/21/22 Updated measurements and goals. PT plan of care discussion with joint decision to discharge today due to his progress and met PT goals. HEP review   IFC TENS X 15 min with moist heat to Rt cervical P.S. and upper trap    PATIENT EDUCATION: Education details: HEP, PT plan of care Person educated: Patient Education method: Explanation, Demonstration, Verbal cues, and Handouts Education comprehension: verbalized understanding and needs further education   HOME EXERCISE PROGRAM: Access Code: [email or web address] URL: [email or web address] Date: 12/29/2021 Prepared by: Elsie Ra  Exercises - Seated Cervical Retraction  - 2 x daily - 6 x weekly - 1 sets - 10 reps - Seated Levator Scapulae Stretch (Mirrored)  - 2 x daily - 6 x weekly - 3 sets - 30 hold - Seated Cervical Sidebending Stretch  - 2 x daily - 6 x weekly - 3 sets - 30 hold - Cervical Extension AROM with Strap  - 2 x daily - 6 x weekly - 1-2 sets - 10 reps - 5 hold - Seated Assisted Cervical Rotation with Towel  - 2 x daily - 6 x weekly - 1-2 sets - 10 reps - 5 hold - Standing Scapular Retraction with External Rotation  - 2 x daily - 6 x weekly - 1 sets - 10 reps - 5 hold  Patient Education - TENS Therapy  ASSESSMENT:  CLINICAL IMPRESSION: He has done excellent with PT. PT plan of care discussion with joint decision to discharge today due to his progress and met PT goals. He had no further questions or concerns and he will keep up with his HEP.  OBJECTIVE IMPAIRMENTS: decreased activity tolerance, decreased neck  mobility, decreased ROM, decreased neck strength, impaired flexibility, postural dysfunction, and pain.  ACTIVITY LIMITATIONS: sleeping, turning his head, driving  PERSONAL FACTORS: PMH:s/p 7/31 L THA, hypertension, hyperlipidemia, HFrEF, CAD, and history of brain aneurysm s/p VP shunt also affecting patient's functional outcome.  REHAB POTENTIAL: Good  CLINICAL DECISION MAKING: Stable/uncomplicated  EVALUATION COMPLEXITY: Low    GOALS: Short term PT Goals Target date: 01/26/2022    Pt will be I and compliant with HEP. Baseline:  Goal status: MET Pt will decrease pain by 25% overall Baseline: Goal status: MET  Long term PT goals Target date:02/09/2022   Pt will improve neck ROM to [facility] (>40 deg rotation) to improve functional scanning with head/eyes Baseline: Goal status: MET Pt will improve FOTO to at least 62% functional to show improved function Baseline: Goal status: MET Pt will reduce pain to overall less than 50% with sleeping and with usual activity and work activity. Baseline: Goal status: MET  PLAN: PT FREQUENCY: 1 times per week  PT DURATION:6 weeks  PLANNED INTERVENTIONS (unless contraindicated): aquatic PT, Canalith repositioning, cryotherapy, Electrical stimulation, Iontophoresis with 4 mg/ml dexamethasome, Moist heat, traction, Ultrasound, gait training, Therapeutic exercise, balance training, neuromuscular re-education, patient/family education, prosthetic training, manual techniques, passive ROM, dry needling, taping, vasopnuematic device, vestibular, spinal manipulations, joint manipulations  PLAN FOR NEXT SESSION: DC today Debbe Odea, PT,DPT 01/21/2022, 11:13 AM

## 2022-01-25 ENCOUNTER — Ambulatory Visit: Payer: PPO | Admitting: Family Medicine

## 2022-01-25 VITALS — BP 104/62 | HR 52 | Ht 71.0 in | Wt 203.0 lb

## 2022-01-25 DIAGNOSIS — M542 Cervicalgia: Secondary | ICD-10-CM

## 2022-01-25 DIAGNOSIS — G8929 Other chronic pain: Secondary | ICD-10-CM | POA: Diagnosis not present

## 2022-01-25 NOTE — Patient Instructions (Signed)
Thank you for coming in today.   Recheck as needed.   Continue home exercises.   Use heat as needed.

## 2022-01-27 ENCOUNTER — Ambulatory Visit: Payer: PPO | Admitting: Family Medicine

## 2022-02-01 ENCOUNTER — Other Ambulatory Visit: Payer: Self-pay | Admitting: Cardiovascular Disease

## 2022-02-02 ENCOUNTER — Other Ambulatory Visit: Payer: Self-pay | Admitting: Cardiovascular Disease

## 2022-02-03 ENCOUNTER — Other Ambulatory Visit: Payer: Self-pay | Admitting: Cardiovascular Disease

## 2022-02-04 ENCOUNTER — Telehealth: Payer: Self-pay | Admitting: Cardiovascular Disease

## 2022-02-04 MED ORDER — ENTRESTO 97-103 MG PO TABS: 1.0000 | ORAL_TABLET | Freq: Two times a day (BID) | ORAL | 3 refills | Status: DC

## 2022-02-04 NOTE — Telephone Encounter (Signed)
Pt c/o medication issue:  1. Name of Medication: ENTRESTO 97-103 MG   2. How are you currently taking this medication (dosage and times per day)?    3. Are you having a reaction (difficulty breathing--STAT)? no  4. What is your medication issue? Wife was calling in for a refill but medication is not listed under his current medication. Please advise

## 2022-02-04 NOTE — Telephone Encounter (Signed)
Spoke with wife who asked for refill on Entresto 97-103 twice daily. The medication was stopped while patient was being treated for COVID during hospital admission and was supposed to restart it 1 week after discharge. Prescription sent to patient's pharmacy.

## 2022-02-25 ENCOUNTER — Ambulatory Visit: Payer: PPO | Admitting: Orthopaedic Surgery

## 2022-03-01 ENCOUNTER — Encounter: Payer: Self-pay | Admitting: Orthopaedic Surgery

## 2022-03-01 ENCOUNTER — Ambulatory Visit (INDEPENDENT_AMBULATORY_CARE_PROVIDER_SITE_OTHER): Payer: PPO

## 2022-03-01 ENCOUNTER — Ambulatory Visit: Payer: PPO | Admitting: Physician Assistant

## 2022-03-01 DIAGNOSIS — Z96642 Presence of left artificial hip joint: Secondary | ICD-10-CM | POA: Diagnosis not present

## 2022-03-01 NOTE — Progress Notes (Signed)
Post-Op Visit Note   Patient: Trevor Schmidt           Date of Birth: 1940/02/20           MRN: [id number] Visit Date: 03/01/2022 PCP: Deland Pretty, MD   Assessment & Plan:  Chief Complaint:  Chief Complaint  Patient presents with   Left Hip - Follow-up    Left total hip arthroplasty 08/30/2021   Visit Diagnoses:  1. S/P total left hip arthroplasty     Plan: Patient is a pleasant 82 year old gentleman who comes in today 6 months status post left total hip replacement 08/30/2021.  He has been doing well.  No complaints.  Examination of the left hip reveals painless hip flexion and logroll.  He is neurovascularly intact distally.  At this point, he will continue to advance with activity as tolerated.  Dental prophylaxis reinforced.  Follow-up in 1 year for repeat evaluation and AP pelvis x-rays.  Call with concerns or questions.  Follow-Up Instructions: Return in about 6 months (around 08/30/2022).   Orders:  Orders Placed This Encounter  Procedures   XR HIP UNILAT W OR W/O PELVIS 2-3 VIEWS LEFT   No orders of the defined types were placed in this encounter.   Imaging: XR HIP UNILAT W OR W/O PELVIS 2-3 VIEWS LEFT  Result Date: 03/01/2022 Well-seated prosthesis without complication   PMFS History: Patient Active Problem List   Diagnosis Date Noted   COVID-19 virus infection 10/07/2021   SIRS (systemic inflammatory response syndrome) ([place]) 10/07/2021   AKI (acute kidney injury) ([place]) 10/07/2021   Generalized weakness 10/07/2021   HFrEF (heart failure with reduced ejection fraction) ([place]) 10/07/2021   S/P VP shunt 10/07/2021   Thrombocytopenia ([place]) 10/07/2021   Status post total replacement of left hip 08/30/2021   Primary osteoarthritis of left hip 07/09/2021   Degenerative joint disease (DJD) of hip 07/01/2021   Chest pain 06/22/2019   Cardiomyopathy, ischemic 07/13/2015   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   CAD in native artery    Hyperlipidemia LDL  goal <70 11/28/2014   OSA on CPAP 11/28/2014   Hyperlipidemia 10/16/2014   LBBB (left bundle branch block) 10/16/2014   Mitral valve prolapse 10/07/2014   Essential hypertension 10/07/2014   BPH (benign prostatic hyperplasia) 10/07/2014   Past Medical History:  Diagnosis Date   Acute bronchitis 02/04/2015   Arthritis    Cancer (HCC)    bladder   Confusion    Depression    Dysrhythmia    LBBB   Edema    feet/ankles   GERD (gastroesophageal reflux disease)    HOH (hard of hearing)    aids   Hypertension    Kidney stones    MVP (mitral valve prolapse)    Prostate disease    Sleep apnea    CPAP    Family History  Problem Relation Age of Onset   Heart disease Mother    Prostate cancer Father    Healthy Maternal Grandmother    Healthy Maternal Grandfather    Healthy Paternal Grandmother    Healthy Paternal Grandfather     Past Surgical History:  Procedure Laterality Date   BACK SURGERY     BLADDER SURGERY     cancer   CARDIAC CATHETERIZATION N/A 12/09/2014   Procedure: Left Heart Cath and Coronary Angiography;  Surgeon: Troy Sine, MD;  Location: [facility];  Service: Cardiovascular;  Laterality: N/A;   CATARACT EXTRACTION W/PHACO Right 09/08/2014   Procedure:  CATARACT EXTRACTION PHACO AND INTRAOCULAR LENS PLACEMENT (I[facility]);  Surgeon: Estill Cotta, MD;  Location: [facility];  Service: Ophthalmology;  Laterality: Right;  US:1:06.3 AP%:23.3 CDE:24.97 Fluid lot# [id number]   CEREBRAL ANEURYSM REPAIR  2001   coil and shunt placement   CYSTOSCOPY     litho x 2   KNEE ARTHROSCOPY Right    TONSILLECTOMY     tonsils and adenoids removed as a child   TOTAL HIP ARTHROPLASTY Left 08/30/2021   Procedure: LEFT TOTAL HIP ARTHROPLASTY ANTERIOR APPROACH;  Surgeon: Leandrew Koyanagi, MD;  Location: [facility];  Service: Orthopedics;  Laterality: Left;  3-C   VENTRICULOPERITONEAL SHUNT  2001   WRIST SURGERY Bilateral    ctr   Social History   Occupational History    Occupation: Retired  Tobacco Use   Smoking status: Former    Types: Cigarettes    Quit date: 2003    Years since quitting: 21.0   Smokeless tobacco: Former    Types: Snuff    Quit date: 2022   Tobacco comments:    Chews [place]  Vaping Use   Vaping Use: Never used  Substance and Sexual Activity   Alcohol use: No   Drug use: No   Sexual activity: Not on file

## 2022-05-03 ENCOUNTER — Other Ambulatory Visit: Payer: Self-pay | Admitting: Cardiovascular Disease

## 2022-05-16 ENCOUNTER — Ambulatory Visit: Payer: PPO | Attending: Cardiovascular Disease | Admitting: Cardiovascular Disease

## 2022-05-16 ENCOUNTER — Encounter: Payer: Self-pay | Admitting: Cardiovascular Disease

## 2022-05-16 VITALS — BP 122/60 | HR 54 | Ht 70.0 in | Wt 206.0 lb

## 2022-05-16 DIAGNOSIS — I447 Left bundle-branch block, unspecified: Secondary | ICD-10-CM

## 2022-05-16 DIAGNOSIS — I2510 Atherosclerotic heart disease of native coronary artery without angina pectoris: Secondary | ICD-10-CM

## 2022-05-16 DIAGNOSIS — I10 Essential (primary) hypertension: Secondary | ICD-10-CM | POA: Diagnosis not present

## 2022-05-16 MED ORDER — ASPIRIN 81 MG PO TBEC: 81.0000 mg | DELAYED_RELEASE_TABLET | Freq: Every day | ORAL | 12 refills | Status: AC

## 2022-05-16 NOTE — Progress Notes (Signed)
Patient ID: Trevor Schmidt, male   DOB: [birth year], 82 y.o.   MRN: [id number]      Primary: Trevor Luz, FNP  HPI:  Trevor Schmidt is an 82 year old white male who established care with me in September 2016.  I last saw him on February 22, 2021.  He presents for a 15 month follow-up evaluation.  Trevor Schmidt has a history of hypertension, and previous diagnosis of mitral valve prolapse.  Remotely, he had seen Dr. Patty Schmidt.  In 2001 he had undergone a cardiac catheterization and was not told of having significant coronary blockage.  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  He recently moved back to the [place] area.  He had seen Dr. Lady Schmidt.  He has a history of obstructive sleep apnea and has been on CPAP therapy.  He received a new machine in February 2016 after his old machine malfunction.  A review of records from the [facility] reveals that there is also a history of remote PAF, hyperlipidemia, essential hypertension.  He denies any chest pain.  He is unaware of any recent cardiac arrhythmia.  He is using CPAP with 100% compliance.  He also has a history of hyperlipidemia and has been on atorvastatin.    When I initially saw him, I scheduled him for an echo Doppler study and nuclear stress test.  The echo Doppler study showed an ejection fraction of 30% with mild LVH and severe hypokinesis of the septal wall and inferior walls.  There was septal lateral dyssynergy.  There was grade 1 diastolic dysfunction.  There was no evidence for aortic valve stenosis, but there was mild aortic insufficiency.  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  He had mitral annular calcification with trivial Trevor, mild left atrial dilatation, and mild right atrial dilatation, he underwent a nuclear perfusion study which revealed concordant data with an ejection fraction of 34%.  He has underlying left bundle branch block.  A reversible septal and  inferior perfusion defect was demonstrated.    He underwent cardiac catheterization on 11/28/2014 which revealed CAD with a very short short left main, which immediately bifurcated into the LAD and left circumflex vessel.  The LAD had 30% proximal stenosis immediately after the first diagonal branch.  The circumflex vessel was a large caliber vessel, and there was extensive collateralization to the distal RCA branches.  The RCA immediately gave rise to a conus branch and was tortuous.  There was diffuse 50-60% stenoses proximal and medially beyond the acute margin prior to a large PDA vessel.  The distal RCA after second small inferior LV branch was totally occluded.  There were extensive collateralization to the distal RCA via the left injection.  He was felt that his inferior ischemia seen on the nuclear study was due to his distal RCA occlusion.  Medical therapy was recommended.  In June 2017 a f/u echo Doppler study showed an EF of 35% with Gr I DD.   His aortic root was mildly dioated at 40 mm.  Was aortic valve sclerosis without stenosis.  There was diffuse hypokinesis with septal lateral dyssynergy.  He had laboratory done by Trevor Schmidt in February 2019 which showed a BUN of 11 creatinine 0.9.  LFTs were normal.  He continues to be on atorvastatin for hyperlipidemia and recent lipid studies were excellent with a total cholesterol 104, triglycerides 89, LDL 43.  He continues to use CPAP with 100%  compliance and has a ResMed AirSense 10 CPAP machine. His DME company is [employer].  He has remote history of bladder cancer and is followed by Dr. Vernie Schmidt.    A follow-up echo Doppler study in September 04, 2017  demonstrated severe global LV dysfunction with an EF of 20 to 25%, moderate LVH, grade 1 diastolic dysfunction, mild aortic insufficiency, mild dilation of his a sending aorta, mitral annular calcification with mild Trevor, and moderate left atrial enlargement.  His EF had decreased since his last evaluation.  He  noted shortness of breath with exertion.  He can walk at a slow pace up to a mile and a half without shortness of breath. He had recent laboratory done by his primary physician and his potassium was low at 3.4.  He was on 20 mill equivalents of potassium twice a day in addition to doxazosin 2 mg, olmesartan 40 mg, amlodipine 10 mg.   When I saw him on October 05, 2017, I recommended transition to [facility] and discontinued olmesartan.  I also recommended that he reduce his KCl to 20 mEq daily.  He saw Trevor Schmidt, Pharm.D. on October 31, 2017.  He was tolerating his medication well without dizziness or chest pain.  His potassium was still 3.3 checked the day before and he was advised to resume the 20 mg once twice a day of potassium supplementation.  Since his blood pressure was 112/62 Trevor Schmidt was not further titrated.  When I saw him on November 30, 2017 he was doing well on Entresto 49/51 mg twice a day in place of olmesartan.  BP during that evaluation was stable and I added low-dose spironolactone 12.5 mg for aldosterone blockade.  Follow-up bmet on December 14, 2017 showed creatinine of 1.12 and potassium 4.2.  I saw him on January 11, 2018.  At that time, his blood pressure was 122/78.  I recommended he discontinue doxazosin and further titrated Entresto to 97/103 mg bid.  He has felt improved on maximal therapy.  He underwent a follow-up echo Doppler study on April 11, 2018 which shows slight improvement in ejection fraction now at 25-30.  His left atrium was moderately dilated.  There was mild aortic sclerosis without stenosis.  There was mild dilation of his ascending aorta.  Repeat laboratory in [facility] Schmidt office on March 12, 2018 showed stable renal function with a creatinine of 1.11.  Potassium was 4.4.   I saw him in March 2020.  He remained stable and denied any symptoms of CHF.  An echo Doppler study on April 11, 2018 continue to show an EF of 25 to 30% with mild concentric LVH and  grade 1 diastolic dysfunction.  There was mitral annular calcification and aortic sclerosis with mild AR.  At the time he denied chest pain PND orthopnea or symptoms of CHF.  He was continuing to use CPAP with 100% compliance.   When I saw him in September 2020 he continued to be stable.  He was walking at least 1-1/2 miles per day and denied any change in exercise tolerance. He underwent complete set of laboratory at Dr. Carolee Rota office on September 13, 2018 which remained stable.  Hemoglobin hematocrit were 12.7/37.8.  Leukos was minimally increased at 105.  Renal function was stable with a creatinine of 1.19 and he had normal LFTs.  Lipid studies were excellent with total cholesterol 108, HDL 37, LDL 41 and triglycerides 150.  TSH was normal at 3.17.    I saw him in September 2021  at which time he continued to do well.  He was cutting grass and denied any shortness of breath with activity.  He was continuing to walk at least 1/2 mile per day and was unaware of any presyncope or syncope. He was evaluated by Judy Pimple in June 2021 and with his reduced EF in the 30 to 35% range which had improved with Trevor Schmidt she discussed the possibility of EP evaluation for consideration of prophylactic ICD.  During my evaluation, I also had further discussion concerning possible prophylactic ICD placement.  At the time he was still hesitant to pursuing this option.  His blood pressure was mildly orthostatic during that evaluation I reduced his amlodipine from 10 mg down to 5 mg.  He was to continue his carvedilol 6.25 mg twice a day, Entresto 97/103 mg twice a day in addition to spironolactone 12.5 mg.  He continued to be on atorvastatin 40 mg with an LDL in August 2021 at 42.  He continued to use CPAP and denied residual daytime sleepiness.  At his visit in January 2022 he continued to feel well.   He has given further thought and presents to the office today stating that he has decided to be evaluated for prophylactic  ICD in light of his reduced LV function.  He was unaware of any palpitations.    On March 06, 2020, TrevorSchmidt  was evaluated by Dr. Berton Mount for consideration of possible ICD candidacy.  With his age of 6, EF at the borderline of 30 to 35% and with his fairly borderline class II symptoms it was felt that he may not require ICD therapy.  Dr. Graciela Husbands reviewed with him the recent [place] trial which failed to show benefit in patients over 68 with nonischemic cardiomyopathy.  However, in the future with his left bundle branch block he may be a candidate for CRT pacing.  When I saw him on August 24, 2020 he continued to feel well and denied any symptoms of chest pain, shortness of breath, or palpitations.  He denied any presyncope or syncope.  He waso n a regimen now consisting of Entresto 97/103 twice daily, carvedilol 6.25 mg twice a day, spironolactone 12.5 mg daily in addition to amlodipine 5 mg daily.  He continues to use CPAP with 100% compliance.  He had recently had a message on his CPAP machine since it is over 5 years that he may be approaching the end of his motor life.  As result he contacted [employer] his DME company and he is on a waiting list for a new ResMed CPAP machine with a wait list of approximately 4 to 6 months.    I last saw him on February 22, 2021.  He has continued to feel well and denies any chest pain or palpitations.  He received a new ResMed air sense 11 AutoSet CPAP unit on August 24, 2020.  This apparently has been set at a pressure range of 4 to 20 cm.  He was meeting compliance standards on his initial download from July 25 through September 22, 2020.  His 95th percentile pressure was 11.2 with maximum average pressure 13.8 cm.  AHI was 3.1/h.  His most recent download from January 21, 2021 through February 19, 2021 continues to show excellent compliance with average use of 10 hours and 36 minutes per night.  AHI is 2.2 and his 95th percentile pressure is 12.2 cm of water with maximum  average pressure 15.6.   Since I last saw him,  he was evaluated by Micah Flesher, on July 22, 2021 for preoperative assessment prior to undergoing left hip surgery.  Presently, Trevor Schmidt feels well.  He went successful left hip surgery by [facility] on August 30, 2021.  Subsequent to his surgery he was advised to take aspirin 81 mg twice a day and he has continued to do so.  He denies any chest pain or shortness of breath.  He continues to be on a regimen of amlodipine 5 mg, carvedilol 6.25 mg twice a day, Entresto 97/103 mg twice a day, and spironolactone 12.5 mg daily.  He is on atorvastatin 40 mg daily for hyperlipidemia.  He is on citalopram for depression.  He continues to use CPAP.  He has a ResMed AirSense 11 AutoSet unit with set up date August 24, 2020.  [employer] is his DME company.  Blood was obtained from March 14 through May 13, 2022.  He continues to have excellent compliance with average use at 8 hours and 49 minutes per night.  Apparently, his pressure setting is at a 4 to 20 cm range and AHI is 3.5 with his 95th percentile pressure 13.8 and maximum average pressure at 16.4.  He presents for follow-up evaluation.  Past Medical History:  Diagnosis Date   Acute bronchitis 02/04/2015   Arthritis    Cancer    bladder   Confusion    Depression    Dysrhythmia    LBBB   Edema    feet/ankles   GERD (gastroesophageal reflux disease)    HOH (hard of hearing)    aids   Hypertension    Kidney stones    MVP (mitral valve prolapse)    Prostate disease    Sleep apnea    CPAP    Past Surgical History:  Procedure Laterality Date   BACK SURGERY     BLADDER SURGERY     cancer   CARDIAC CATHETERIZATION N/A 12/09/2014   Procedure: Left Heart Cath and Coronary Angiography;  Surgeon: Lennette Bihari, MD;  Location: [facility];  Service: Cardiovascular;  Laterality: N/A;   CATARACT EXTRACTION W/PHACO Right 09/08/2014   Procedure: CATARACT EXTRACTION PHACO AND INTRAOCULAR LENS  PLACEMENT (IOC);  Surgeon: Sallee Lange, MD;  Location: [facility] ORS;  Service: Ophthalmology;  Laterality: Right;  US:1:06.3 AP%:23.3 CDE:24.97 Fluid lot# [id number]   CEREBRAL ANEURYSM REPAIR  2001   coil and shunt placement   CYSTOSCOPY     litho x 2   KNEE ARTHROSCOPY Right    TONSILLECTOMY     tonsils and adenoids removed as a child   TOTAL HIP ARTHROPLASTY Left 08/30/2021   Procedure: LEFT TOTAL HIP ARTHROPLASTY ANTERIOR APPROACH;  Surgeon: Tarry Kos, MD;  Location: [facility] OR;  Service: Orthopedics;  Laterality: Left;  3-C   VENTRICULOPERITONEAL SHUNT  2001   WRIST SURGERY Bilateral    ctr    Allergies  Allergen Reactions   Buspirone Diarrhea   Ceftriaxone Rash    Current Outpatient Medications  Medication Sig Dispense Refill   amLODipine (NORVASC) 5 MG tablet TAKE ONE TABLET BY MOUTH DAILY 90 tablet 1   amoxicillin (AMOXIL) 500 MG capsule Take four pills one hour prior to dental work 8 capsule 2   ascorbic acid (VITAMIN C) 500 MG tablet Take 1 tablet (500 mg total) by mouth daily. 30 tablet 0   aspirin EC 81 MG tablet Take 1 tablet (81 mg total) by mouth in the morning and at bedtime. To be taken after surgery  to prevent  blood clots.  Swallow whole. 84 tablet 2   atorvastatin (LIPITOR) 40 MG tablet Take 40 mg by mouth every evening.     Calcium Carb-Cholecalciferol (CALCIUM 600-D PO) Take 1 tablet by mouth 2 (two) times daily.     carvedilol (COREG) 6.25 MG tablet TAKE ONE TABLET BY MOUTH TWICE A DAY 180 tablet 2   citalopram (CELEXA) 40 MG tablet TAKE 1 TABLET (40 MG TOTAL) BY MOUTH DAILY. 30 tablet 0   docusate sodium (COLACE) 100 MG capsule Take 1 capsule (100 mg total) by mouth daily as needed. 30 capsule 2   finasteride (PROSCAR) 5 MG tablet Take 5 mg by mouth daily.     magnesium oxide (MAG-OX) 400 MG tablet Take 400 mg by mouth every evening.     Multiple Vitamins-Minerals (OCUVITE-LUTEIN PO) Take 2 capsules by mouth daily.     Potassium Chloride ER 20 MEQ TBCR Take  1 tablet by mouth daily. 90 tablet 1   sacubitril-valsartan (ENTRESTO) 97-103 MG Take 1 tablet by mouth 2 (two) times daily. 180 tablet 3   spironolactone (ALDACTONE) 25 MG tablet TAKE 1/2 TABLET BY MOUTH DAILY 45 tablet 0   vitamin B-12 (CYANOCOBALAMIN) 500 MCG tablet Take 500 mcg by mouth every Sunday.     zolpidem (AMBIEN) 5 MG tablet Take 5 mg by mouth at bedtime.     No current facility-administered medications for this visit.    Social History   Socioeconomic History   Marital status: Married    Spouse name: Not on file   Number of children: 4   Years of education: 12   Highest education level: Not on file  Occupational History   Occupation: Retired  Tobacco Use   Smoking status: Former    Types: Cigarettes    Quit date: 2003    Years since quitting: 21.3   Smokeless tobacco: Former    Types: Snuff    Quit date: 2022   Tobacco comments:    Chews [place]te  Vaping Use   Vaping Use: Never used  Substance and Sexual Activity   Alcohol use: No   Drug use: No   Sexual activity: Not on file  Other Topics Concern   Not on file  Social History Narrative   Fun: Fish, woodworking   Social Determinants of Health   Financial Resource Strain: Not on file  Food Insecurity: Not on file  Transportation Needs: Not on file  Physical Activity: Not on file  Stress: Not on file  Social Connections: Not on file  Intimate Partner Violence: Not on file   Additional social history is notable that he is married for 43 years.  His 4 children, 9 grandchildren, and 2 great-grandchildren.  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  He completed 12 grades years of education.  He had smoked for 30 years but quit tobacco in 2003.  There is no EtOH use.    Family History  Problem Relation Age of Onset   Heart disease Mother    Prostate cancer Father    Healthy Maternal Grandmother    Healthy Maternal Grandfather    Healthy Paternal Grandmother    Healthy  Paternal Grandfather     ROS General: Negative; No fevers, chills, or night sweats HEENT: Negative; No changes in vision or hearing, sinus congestion, difficulty swallowing Pulmonary: Negative; No cough, wheezing, shortness of breath, hemoptysis Cardiovascular:  See HPI;  GI: Negative; No nausea, vomiting, diarrhea, or abdominal pain  GU: Remote history of bladder tumor. Musculoskeletal: Status post left total hip arthroplasty, anterior approach August 30, 2021, [facility] Hematologic/Oncologic: Negative; no easy bruising, bleeding Endocrine: Negative; no heat/cold intolerance; no diabetes Neuro: Negative; no changes in balance, headaches Skin: Negative; No rashes or skin lesions Psychiatric: Negative; No behavioral problems, depression Sleep:  Positive for sleep apnea on CPAP ; positive for difficulty with sleep initiation Other comprehensive 14 point system review is negative   Physical Exam BP 122/60   Pulse (!) 54   Ht 5\' 10"  (1.778 m)   Wt 206 lb (93.4 kg)   SpO2 95%   BMI 29.56 kg/m   Repeat blood pressure by me was 128/64  Wt Readings from Last 3 Encounters:  07/07/16 196 lb (88.9 kg)  01/05/16 202 lb (91.6 kg)  07/13/15 202 lb 6 oz (91.8 kg)    General: Alert, oriented, no distress.  Skin: normal turgor, no rashes, warm and dry HEENT: Normocephalic, atraumatic. Pupils equal round and reactive to light; sclera anicteric; extraocular muscles intact;  Nose without nasal septal hypertrophy Mouth/Parynx benign; Mallinpatti scale 3 Neck: No JVD, no carotid bruits; normal carotid upstroke Lungs: clear to ausculatation and percussion; no wheezing or rales Chest wall: without tenderness to palpitation Heart: PMI not displaced, RRR, s1 s2 normal, 1/6 systolic murmur, no diastolic murmur, no rubs, gallops, thrills, or heaves Abdomen: soft, nontender; no hepatosplenomehaly, BS+; abdominal aorta nontender and not dilated by palpation. Back: no CVA tenderness Pulses  2+ Musculoskeletal: full range of motion, normal strength, no joint deformities Extremities: no clubbing cyanosis or edema, Homan's sign negative  Neurologic: grossly nonfocal; Cranial nerves grossly wnl Psychologic: Normal mood and affect    May 16, 2022 ECG (independently read by me): Sinus bradycardia 54 bpm.  Borderline first-degree AV block; PR 206 ms.  Left bundle branch block.  February 22, 2021 ECG (independently read by me):  Sinus bradycardia at 55, LBBB  August 24, 2020 ECG (independently read by me): Sinus bradycardia 56 bpm, left axis deviation, left bundle branch block.  QTc interval 465 ms.  February 26, 2020 ECG (independently read by me): Sinus bradycardia at 57; LBBB, QRS  168 msec; QTc 467 msec  May 2022 ECG (independently read by me): Sinus bradycardia at 54 bpm, first-degree AV block, left bundle branch block  September 2020 ECG (independently read by me): Bradycardia 57 bpm, first-degree degree AV block with PR interval 214 ms; left bundle branch block.  March 2020 ECG (independently read by me): Sinus bradycardia 51 bpm.  Left bundle branch block.  First-degree AV block with appeared in with 210 ms.  January 11, 2018 ECG (independently read by me): Sinus bradycardia 52 bpm.  First-degree AV block with appeared normal at 220 ms.  Left bundle branch block.  November 30, 2017 ECG (independently read by me): Sinus bradycardia 57 bpm.  Left bundle branch block with repolarization changes.  Right superior axis.  October 05, 2017 ECG (independently read by me): Sinus bradycardia 53 bpm.  First-degree AV block.  Left bundle branch block with repolarization changes.  July 2019 ECG (independently read by me): Sinus bradycardia 52 bpm with first-degree AV block.  Left bundle branch block.  June 2018 ECG (independently read by me): Sinus bradycardia 53 bpm with first-degree AV block.  PR interval 210 ms.  Left bundle branch block with repolarization changes.  Left axis  deviation.  December 2017 ECG (independently read by me): sinus bradycardia 53 bpm with left bundle branch block.  First degree AV  block.  December 2016 ECG (independently read by me):  Sinus bradycardia at 56 bpm with first-degree AV block with a PR interval at 216.  Left bundle branch block with repolarization changes.  10/14/2014 ECG (independently read by me): Sinus bradycardia 52 bpm per first-degree AV block with a PR interval of 212 ms.  Left bundle branch block with repolarization changes.  LABS:     Latest Ref Rng & Units 10/10/2021    3:01 AM 10/09/2021    8:45 AM 10/08/2021    9:07 AM  BMP  Glucose 70 - 99 mg/dL 030  131  438   BUN 8 - 23 mg/dL 18  9  21    Creatinine 0.61 - 1.24 mg/dL 8.87  5.79  7.28   Sodium 135 - 145 mmol/L 135  136  135   Potassium 3.5 - 5.1 mmol/L 4.0  3.3  3.4   Chloride 98 - 111 mmol/L 106  103  106   CO2 22 - 32 mmol/L 20  19  19    Calcium 8.9 - 10.3 mg/dL 8.9  8.6  8.2         Latest Ref Rng & Units 10/10/2021    3:01 AM 10/09/2021    8:45 AM 10/08/2021    9:07 AM  Hepatic Function  Total Protein 6.5 - 8.1 g/dL 6.2  6.0  5.8   Albumin 3.5 - 5.0 g/dL 3.1  2.5  3.1   AST 15 - 41 U/L 32  31  39   ALT 0 - 44 U/L 22  43  22   Alk Phosphatase 38 - 126 U/L 84  58  82   Total Bilirubin 0.3 - 1.2 mg/dL 0.5  0.9  0.6        Latest Ref Rng & Units 10/10/2021    3:01 AM 10/09/2021    8:45 AM 10/08/2021    9:07 AM  CBC  WBC 4.0 - 10.5 K/uL 3.2  11.3  6.8   Hemoglobin 13.0 - 17.0 g/dL 20.6  01.5  61.5   Hematocrit 39.0 - 52.0 % 34.3  36.9  33.3   Platelets 150 - 400 K/uL 111  166  109    Lab Results  Component Value Date   MCV 88.9 10/10/2021   MCV 93.7 10/09/2021   MCV 92.8 10/08/2021   Lab Results  Component Value Date   TSH 2.79 01/05/2016   Lab Results  Component Value Date   HGBA1C 5.8 (H) 06/22/2019     BNP    Component Value Date/Time   BNP 230.3 (H) 10/07/2021 1609    ProBNP    Component Value Date/Time   PROBNP 470  01/29/2018 1001     Lipid Panel     Component Value Date/Time   CHOL 96 06/22/2019 2334   TRIG 60 06/22/2019 2334   HDL 37 (L) 06/22/2019 2334   CHOLHDL 2.6 06/22/2019 2334   VLDL 12 06/22/2019 2334   LDLCALC 47 06/22/2019 2334    RADIOLOGY: No results found.  IMPRESSION:  No diagnosis found.    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  He was found to have reduced LV function with lateral dyssynergy most likely contributed by his underlying left bundle branch block.  A follow-up echo Doppler study in August 2018 showed further reduction in  LV function  with an EF of 20 to 25%.  There was moderate LVH with grade 1 diastolic dysfunction.  He had mild AR, mild dilation of his ascending aorta, mitral annular calcification with mild Trevor and moderate left atrial enlargement.  He was started on Entresto with titration up to maximum dose over several office visits.  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  He had moderate left atrial dilatation.  Present, Trevor Schmidt remains asymptomatic on his guideline directed medical therapy is tolerating Entresto at maximum dose 97/103 mg twice a day, spironolactone 12.5 mg daily, carvedilol 6.25 mg twice a day, in addition to amlodipine 5 mg daily.  Blood pressure today is stable.  He also is on lovastatin 40 mg for hyperlipidemia.  He underwent successful left total hip arthroplasty study using the anterior approach and had rapid recovery.  Since that time he has been taking aspirin 81 mg twice a day.  I have recommended he reduce this down to 81 mg daily.  He continues to be on citalopram for depression.  He is followed by Dr. Merri Brunette  who checks his laboratory.  I reviewed his most recent CPAP download.  Usage is excellent averaging 8 hours and 49 minutes per night.  I will change his CPAP settings and will increase his ramp start pressure from 4 to 6 cm of water.  I will change his AutoSet pressure range from 4 to 20 cm to 9 to 18 cm of water.  [employer] is his DME company.  As long as he is stable I will see him in 1 year for follow-up evaluation or sooner as needed.  Lennette Bihari, MD, [facility], ABSM Diplomate, American Board of Sleep Medicine  05/16/2022 2:54 PM

## 2022-05-16 NOTE — Patient Instructions (Addendum)
Medication Instructions:  Decrease Aspirin to 81 mg daily Continue all other medications *If you need a refill on your cardiac medications before your next appointment, please call your pharmacy*   Lab Work: None  ordered   Testing/Procedures: None ordered   Follow-Up: At [facility], you and your health needs are our priority.  As part of our continuing mission to provide you with exceptional heart care, we have created designated Provider Care Teams.  These Care Teams include your primary Cardiologist (physician) and Advanced Practice Providers (APPs -  Physician Assistants and Nurse Practitioners) who all work together to provide you with the care you need, when you need it.  We recommend signing up for the patient portal called "MyChart".  Sign up information is provided on this After Visit Summary.  MyChart is used to connect with patients for Virtual Visits (Telemedicine).  Patients are able to view lab/test results, encounter notes, upcoming appointments, etc.  Non-urgent messages can be sent to your provider as well.   To learn more about what you can do with MyChart, go to [email or web address].    Your next appointment:  1 year   Call in Jan to schedule April appointment     Provider:  [facility]

## 2022-05-24 ENCOUNTER — Other Ambulatory Visit: Payer: Self-pay | Admitting: Cardiovascular Disease

## 2022-05-27 ENCOUNTER — Other Ambulatory Visit: Payer: Self-pay | Admitting: Cardiovascular Disease

## 2022-08-03 ENCOUNTER — Other Ambulatory Visit: Payer: Self-pay | Admitting: *Deleted

## 2022-08-03 MED ORDER — SPIRONOLACTONE 25 MG PO TABS: 12.5000 mg | ORAL_TABLET | Freq: Every day | ORAL | 3 refills | Status: DC

## 2022-08-27 ENCOUNTER — Other Ambulatory Visit: Payer: Self-pay | Admitting: Cardiovascular Disease

## 2022-08-29 ENCOUNTER — Other Ambulatory Visit: Payer: Self-pay | Admitting: Cardiovascular Disease

## 2022-08-30 ENCOUNTER — Encounter: Payer: Self-pay | Admitting: Orthopaedic Surgery

## 2022-08-30 ENCOUNTER — Ambulatory Visit: Payer: PPO | Admitting: Orthopaedic Surgery

## 2022-08-30 ENCOUNTER — Other Ambulatory Visit (INDEPENDENT_AMBULATORY_CARE_PROVIDER_SITE_OTHER): Payer: PPO

## 2022-08-30 DIAGNOSIS — Z96642 Presence of left artificial hip joint: Secondary | ICD-10-CM

## 2022-08-30 NOTE — Progress Notes (Signed)
Office Visit Note   Patient: Trevor Schmidt           Date of Birth: 04/13/1940           MRN: [id number] Visit Date: 08/30/2022              Requested by: Merri Brunette, MD [address] [place],  [place] [address] PCP: Merri Brunette, MD   Assessment & Plan: Visit Diagnoses:  1. S/P total left hip arthroplasty     Plan: Trevor Schmidt is now 1 year postop from left total hip replacement.  He is doing well.  Dental prophylaxis reinforced.  Recheck in another year with repeat x-rays.  Follow-Up Instructions: Return in about 1 year (around 08/30/2023).   Orders:  Orders Placed This Encounter  Procedures   XR Pelvis 1-2 Views   No orders of the defined types were placed in this encounter.     Procedures: No procedures performed   Clinical Data: No additional findings.   Subjective: Chief Complaint  Patient presents with   Left Hip - Follow-up    Left total hip arthroplasty 08/30/2021    HPI Trevor Schmidt is 1 year status post left total hip replacement.  He is doing well overall.  He has no complaints.  He has resumed all normal activities. Review of Systems   Objective: Vital Signs: There were no vitals taken for this visit.  Physical Exam  Ortho Exam Examination left hip shows fully healed surgical scar.  Normal gait pattern.  Fluid painless range of motion of the hip. Specialty Comments:  No specialty comments available.  Imaging: XR Pelvis 1-2 Views  Result Date: 08/30/2022 Stable total hip replacement without complications    PMFS History: Patient Active Problem List   Diagnosis Date Noted   COVID-19 virus infection 10/07/2021   SIRS (systemic inflammatory response syndrome) (HCC) 10/07/2021   AKI (acute kidney injury) (HCC) 10/07/2021   Generalized weakness 10/07/2021   HFrEF (heart failure with reduced ejection fraction) (HCC) 10/07/2021   S/P VP shunt 10/07/2021   Thrombocytopenia (HCC) 10/07/2021   Status post total replacement of left hip  08/30/2021   Primary osteoarthritis of left hip 07/09/2021   Degenerative joint disease (DJD) of hip 07/01/2021   Chest pain 06/22/2019   Cardiomyopathy, ischemic 07/13/2015   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   CAD in native artery    Hyperlipidemia LDL goal <70 11/28/2014   OSA on CPAP 11/28/2014   Hyperlipidemia 10/16/2014   LBBB (left bundle branch block) 10/16/2014   Mitral valve prolapse 10/07/2014   Essential hypertension 10/07/2014   BPH (benign prostatic hyperplasia) 10/07/2014   Past Medical History:  Diagnosis Date   Acute bronchitis 02/04/2015   Arthritis    Cancer (HCC)    bladder   Confusion    Depression    Dysrhythmia    LBBB   Edema    feet/ankles   GERD (gastroesophageal reflux disease)    HOH (hard of hearing)    aids   Hypertension    Kidney stones    MVP (mitral valve prolapse)    Prostate disease    Sleep apnea    CPAP    Family History  Problem Relation Age of Onset   Heart disease Mother    Prostate cancer Father    Healthy Maternal Grandmother    Healthy Maternal Grandfather    Healthy Paternal Grandmother    Healthy Paternal Grandfather     Past Surgical History:  Procedure Laterality Date  BACK SURGERY     BLADDER SURGERY     cancer   CARDIAC CATHETERIZATION N/A 12/09/2014   Procedure: Left Heart Cath and Coronary Angiography;  Surgeon: Lennette Bihari, MD;  Location: [facility] INVASIVE CV LAB;  Service: Cardiovascular;  Laterality: N/A;   CATARACT EXTRACTION W/PHACO Right 09/08/2014   Procedure: CATARACT EXTRACTION PHACO AND INTRAOCULAR LENS PLACEMENT (IOC);  Surgeon: Sallee Lange, MD;  Location: [facility] ORS;  Service: Ophthalmology;  Laterality: Right;  US:1:06.3 AP%:23.3 CDE:24.97 Fluid lot# [id number]   CEREBRAL ANEURYSM REPAIR  2001   coil and shunt placement   CYSTOSCOPY     litho x 2   KNEE ARTHROSCOPY Right    TONSILLECTOMY     tonsils and adenoids removed as a child   TOTAL HIP ARTHROPLASTY Left 08/30/2021    Procedure: LEFT TOTAL HIP ARTHROPLASTY ANTERIOR APPROACH;  Surgeon: Tarry Kos, MD;  Location: [facility] OR;  Service: Orthopedics;  Laterality: Left;  3-C   VENTRICULOPERITONEAL SHUNT  2001   WRIST SURGERY Bilateral    ctr   Social History   Occupational History   Occupation: Retired  Tobacco Use   Smoking status: Former    Current packs/day: 0.00    Types: Cigarettes    Quit date: 2003    Years since quitting: 21.5   Smokeless tobacco: Former    Types: Snuff    Quit date: 2022   Tobacco comments:    Chews [place]oret[place]  Vaping Use   Vaping status: Never Used  Substance and Sexual Activity   Alcohol use: No   Drug use: No   Sexual activity: Not on file

## 2022-11-15 ENCOUNTER — Other Ambulatory Visit: Payer: Self-pay | Admitting: Cardiovascular Disease

## 2022-11-17 ENCOUNTER — Other Ambulatory Visit: Payer: Self-pay | Admitting: Cardiovascular Disease

## 2023-01-23 ENCOUNTER — Other Ambulatory Visit: Payer: Self-pay | Admitting: Cardiovascular Disease

## 2023-01-23 ENCOUNTER — Telehealth: Payer: Self-pay | Admitting: Cardiovascular Disease

## 2023-01-23 NOTE — Telephone Encounter (Signed)
Pt's medication was sent to pt's pharmacy as requested. Confirmation received.  °

## 2023-01-23 NOTE — Telephone Encounter (Signed)
Patient's wife is calling regarding refill denial. Patient has an appointment scheduled for 04/21 to be seen. Requesting a callback if second request is also denied to discuss why.   Please advise.

## 2023-05-22 ENCOUNTER — Other Ambulatory Visit: Payer: Self-pay | Admitting: Cardiovascular Disease

## 2023-05-22 ENCOUNTER — Encounter: Payer: Self-pay | Admitting: Cardiovascular Disease

## 2023-05-22 ENCOUNTER — Ambulatory Visit: Payer: PPO | Attending: Cardiovascular Disease | Admitting: Cardiovascular Disease

## 2023-05-22 VITALS — BP 128/66 | HR 58 | Ht 70.0 in | Wt 208.2 lb

## 2023-05-22 DIAGNOSIS — I119 Hypertensive heart disease without heart failure: Secondary | ICD-10-CM

## 2023-05-22 DIAGNOSIS — I10 Essential (primary) hypertension: Secondary | ICD-10-CM

## 2023-05-22 DIAGNOSIS — I5020 Unspecified systolic (congestive) heart failure: Secondary | ICD-10-CM

## 2023-05-22 DIAGNOSIS — G4733 Obstructive sleep apnea (adult) (pediatric): Secondary | ICD-10-CM | POA: Diagnosis not present

## 2023-05-22 DIAGNOSIS — E785 Hyperlipidemia, unspecified: Secondary | ICD-10-CM

## 2023-05-22 DIAGNOSIS — Z79899 Other long term (current) drug therapy: Secondary | ICD-10-CM

## 2023-05-22 LAB — CBC

## 2023-05-22 NOTE — Progress Notes (Signed)
 Patient ID: Trevor Schmidt, male   DOB: 15-Jan-1941, 83 y.o.   MRN: [id number]      Primary: Annamaria Barrette, FNP  HPI:  Trevor Schmidt is an 83 year old white male who established care with me in September 2016.  I last saw him on May 16, 2022  He presents for a 12 month follow-up evaluation.  Trevor Schmidt has a history of hypertension, and previous diagnosis of mitral valve prolapse.  Remotely, he had seen Dr. Santiago Cuff.  In 2001 he had undergone a cardiac catheterization and was not told of having significant coronary blockage.  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  He recently moved back to the [place] area.  He had seen Dr. Cara Chancellor.  He has a history of obstructive sleep apnea and has been on CPAP therapy.  He received a new machine in February 2016 after his old machine malfunction.  A review of records from the [facility] health system reveals that there is also a history of remote PAF, hyperlipidemia, essential hypertension.  He denies any chest pain.  He is unaware of any recent cardiac arrhythmia.  He is using CPAP with 100% compliance.  He also has a history of hyperlipidemia and has been on atorvastatin .    When I initially saw him, I scheduled him for an echo Doppler study and nuclear stress test.  The echo Doppler study showed an ejection fraction of 30% with mild LVH and severe hypokinesis of the septal wall and inferior walls.  There was septal lateral dyssynergy.  There was grade 1 diastolic dysfunction.  There was no evidence for aortic valve stenosis, but there was mild aortic insufficiency.  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  He had mitral annular calcification with trivial Trevor, mild left atrial dilatation, and mild right atrial dilatation, he underwent a nuclear perfusion study which revealed concordant data with an ejection fraction of 34%.  He has underlying left bundle branch block.  A reversible septal and  inferior perfusion defect was demonstrated.    He underwent cardiac catheterization on 11/28/2014 which revealed CAD with a very short short left main, which immediately bifurcated into the LAD and left circumflex vessel.  The LAD had 30% proximal stenosis immediately after the first diagonal branch.  The circumflex vessel was a large caliber vessel, and there was extensive collateralization to the distal RCA branches.  The RCA immediately gave rise to a conus branch and was tortuous.  There was diffuse 50-60% stenoses proximal and medially beyond the acute margin prior to a large PDA vessel.  The distal RCA after second small inferior LV branch was totally occluded.  There were extensive collateralization to the distal RCA via the left injection.  He was felt that his inferior ischemia seen on the nuclear study was due to his distal RCA occlusion.  Medical therapy was recommended.  In June 2017 a f/u echo Doppler study showed an EF of 35% with Gr I DD.   His aortic root was mildly dioated at 40 mm.  Was aortic valve sclerosis without stenosis.  There was diffuse hypokinesis with septal lateral dyssynergy.  He had laboratory done by Dr. Schuyler Custard in February 2019 which showed a BUN of 11 creatinine 0.9.  LFTs were normal.  He continues to be on atorvastatin  for hyperlipidemia and recent lipid studies were excellent with a total cholesterol 104, triglycerides 89, LDL 43.  He continues to use CPAP with 100%  compliance and has a ResMed AirSense 10 CPAP machine. His DME company is [employer].  He has remote history of bladder cancer and is followed by Dr. Ottelin.    A follow-up echo Doppler study in September 04, 2017  demonstrated severe global LV dysfunction with an EF of 20 to 25%, moderate LVH, grade 1 diastolic dysfunction, mild aortic insufficiency, mild dilation of his ascending aorta, mitral annular calcification with mild Trevor, and moderate left atrial enlargement.  His EF had decreased since his last evaluation.  He  noted shortness of breath with exertion.  He can walk at a slow pace up to a mile and a half without shortness of breath. He had recent laboratory done by his primary physician and his potassium was low at 3.4.  He was on 20 mill equivalents of potassium twice a day in addition to doxazosin  2 mg, olmesartan  40 mg, amlodipine  10 mg.   When I saw him on October 05, 2017, I recommended transition to Entresto  and discontinued olmesartan .  I also recommended that he reduce his KCl to 20 mEq daily.  He saw Scottie Cypher, Pharm.D. on October 31, 2017.  He was tolerating his medication well without dizziness or chest pain.  His potassium was still 3.3 checked the day before and he was advised to resume the 20 mg once twice a day of potassium supplementation.  Since his blood pressure was 112/62 Entresto  was not further titrated.  When I saw him on November 30, 2017 he was doing well on Entresto  49/51 mg twice a day in place of olmesartan .  BP during that evaluation was stable and I added low-dose spironolactone  12.5 mg for aldosterone blockade.  Follow-up bmet on December 14, 2017 showed creatinine of 1.12 and potassium 4.2.  I saw him on January 11, 2018.  At that time, his blood pressure was 122/78.  I recommended he discontinue doxazosin  and further titrated Entresto  to 97/103 mg bid.  He has felt improved on maximal therapy.  He underwent a follow-up echo Doppler study on April 11, 2018 which shows slight improvement in ejection fraction now at 25-30.  His left atrium was moderately dilated.  There was mild aortic sclerosis without stenosis.  There was mild dilation of his ascending aorta.  Repeat laboratory in Dr. Franceen Inches office on March 12, 2018 showed stable renal function with a creatinine of 1.11.  Potassium was 4.4.   I saw him in March 2020.  He remained stable and denied any symptoms of CHF.  An echo Doppler study on April 11, 2018 continue to show an EF of 25 to 30% with mild concentric LVH and  grade 1 diastolic dysfunction.  There was mitral annular calcification and aortic sclerosis with mild AR.  At the time he denied chest pain PND orthopnea or symptoms of CHF.  He was continuing to use CPAP with 100% compliance.   When I saw him in September 2020 he continued to be stable.  He was walking at least 1-1/2 miles per day and denied any change in exercise tolerance. He underwent complete set of laboratory at Dr. Estrella Hench office on September 13, 2018 which remained stable.  Hemoglobin hematocrit were 12.7/37.8.  Leukos was minimally increased at 105.  Renal function was stable with a creatinine of 1.19 and he had normal LFTs.  Lipid studies were excellent with total cholesterol 108, HDL 37, LDL 41 and triglycerides 150.  TSH was normal at 3.17.    I saw him in September 2021 at  which time he continued to do well.  He was cutting grass and denied any shortness of breath with activity.  He was continuing to walk at least 1/2 mile per day and was unaware of any presyncope or syncope. He was evaluated by Tacy Expose in June 2021 and with his reduced EF in the 30 to 35% range which had improved with Entresto  she discussed the possibility of EP evaluation for consideration of prophylactic ICD.  During my evaluation, I also had further discussion concerning possible prophylactic ICD placement.  At the time he was still hesitant to pursuing this option.  His blood pressure was mildly orthostatic during that evaluation I reduced his amlodipine  from 10 mg down to 5 mg.  He was to continue his carvedilol  6.25 mg twice a day, Entresto  97/103 mg twice a day in addition to spironolactone  12.5 mg.  He continued to be on atorvastatin  40 mg with an LDL in August 2021 at 42.  He continued to use CPAP and denied residual daytime sleepiness.  At his visit in January 2022 he continued to feel well.   He has given further thought and presents to the office today stating that he has decided to be evaluated for prophylactic  ICD in light of his reduced LV function.  He was unaware of any palpitations.    On March 06, 2020, Trevor Schmidt  was evaluated by Dr. Albertine Hugh for consideration of possible ICD candidacy.  With his age of 76, EF at the borderline of 30 to 35% and with his fairly borderline class II symptoms it was felt that he may not require ICD therapy.  Dr. Rodolfo Clan reviewed with him the recent [place] trial which failed to show benefit in patients over 68 with nonischemic cardiomyopathy.  However, in the future with his left bundle branch block he may be a candidate for CRT pacing.  When I saw him on August 24, 2020 he continued to feel well and denied any symptoms of chest pain, shortness of breath, or palpitations.  He denied any presyncope or syncope.  He waso n a regimen now consisting of Entresto  97/103 twice daily, carvedilol  6.25 mg twice a day, spironolactone  12.5 mg daily in addition to amlodipine  5 mg daily.  He continues to use CPAP with 100% compliance.  He had recently had a message on his CPAP machine since it is over 5 years that he may be approaching the end of his motor life.  As result he contacted [employer] his DME company and he is on a waiting list for a new ResMed CPAP machine with a wait list of approximately 4 to 6 months.    I last saw him on February 22, 2021.  He has continued to feel well and denies any chest pain or palpitations.  He received a new ResMed air sense 11 AutoSet CPAP unit on August 24, 2020.  This apparently has been set at a pressure range of 4 to 20 cm.  He was meeting compliance standards on his initial download from July 25 through September 22, 2020.  His 95th percentile pressure was 11.2 with maximum average pressure 13.8 cm.  AHI was 3.1/h.  His most recent download from January 21, 2021 through February 19, 2021 continues to show excellent compliance with average use of 10 hours and 36 minutes per night.  AHI is 2.2 and his 95th percentile pressure is 12.2 cm of water with maximum  average pressure 15.6.   Since I last saw him, he  was evaluated by Marcie Sever, on July 22, 2021 for preoperative assessment prior to undergoing left hip surgery.  I last saw him on May 16, 2022,  Trevor Schmidt feels well.  He went successful left hip surgery by Dr.Xu on August 30, 2021.  Subsequent to his surgery he was advised to take aspirin  81 mg twice a day and he has continued to do so.  He denies any chest pain or shortness of breath.  He continues to be on a regimen of amlodipine  5 mg, carvedilol  6.25 mg twice a day, Entresto  97/103 mg twice a day, and spironolactone  12.5 mg daily.  He is on atorvastatin  40 mg daily for hyperlipidemia.  He is on citalopram  for depression.  He continues to use CPAP.  He has a ResMed AirSense 11 AutoSet unit with set up date August 24, 2020.  [employer] [facility] is his DME company.  Blood was obtained from March 14 through May 13, 2022.  He continues to have excellent compliance with average use at 8 hours and 49 minutes per night.  Apparently, his pressure setting is at a 4 to 20 cm range and AHI is 3.5 with his 95th percentile pressure 13.8 and maximum average pressure at 16.4.  During that evaluation I changed his ramp start pressure from 4 to 6 cm of water and changed his AutoSet pressure from 4-20 to 9 to 18 cm of water.  Since I last saw him, Trevor Schmidt has continued to do well.  He denies chest pain or shortness of breath.  He walks daily.  He is unaware of any palpitations.  He has not had recent laboratory.  He continues to use CPAP.  A download from March 22 through May 21, 2023 shows 100% compliance with average use at 10 hours and 8 minutes per night.  At his pressure range of 9 to 18 cm of water, AHI is excellent at 2.7.  95th percentile pressure is 16 with maximum average pressure 17.3.  He presents for evaluation.    Past Medical History:  Diagnosis Date   Acute bronchitis 02/04/2015   Arthritis    Cancer (HCC)    bladder   Confusion    Depression     Dysrhythmia    LBBB   Edema    feet/ankles   GERD (gastroesophageal reflux disease)    HOH (hard of hearing)    aids   Hypertension    Kidney stones    MVP (mitral valve prolapse)    Prostate disease    Sleep apnea    CPAP    Past Surgical History:  Procedure Laterality Date   BACK SURGERY     BLADDER SURGERY     cancer   CARDIAC CATHETERIZATION N/A 12/09/2014   Procedure: Left Heart Cath and Coronary Angiography;  Surgeon: Millicent Ally, MD;  Location: [facility];  Service: Cardiovascular;  Laterality: N/A;   CATARACT EXTRACTION W/PHACO Right 09/08/2014   Procedure: CATARACT EXTRACTION PHACO AND INTRAOCULAR LENS PLACEMENT (IOC);  Surgeon: Steven Dingeldein, MD;  Location: [facility] ORS;  Service: Ophthalmology;  Laterality: Right;  US :1:06.3 AP%:23.3 CDE:24.97 Fluid lot# [id number]   CEREBRAL ANEURYSM REPAIR  2001   coil and shunt placement   CYSTOSCOPY     litho x 2   KNEE ARTHROSCOPY Right    TONSILLECTOMY     tonsils and adenoids removed as a child   TOTAL HIP ARTHROPLASTY Left 08/30/2021   Procedure: LEFT TOTAL HIP ARTHROPLASTY ANTERIOR APPROACH;  Surgeon: Christiane Cowing,  Artie Bimler, MD;  Location: [facility] OR;  Service: Orthopedics;  Laterality: Left;  3-C   VENTRICULOPERITONEAL SHUNT  2001   WRIST SURGERY Bilateral    ctr    Allergies  Allergen Reactions   Buspirone Diarrhea   Ceftriaxone Rash    Current Outpatient Medications  Medication Sig Dispense Refill   amLODipine  (NORVASC ) 5 MG tablet TAKE 1 TABLET BY MOUTH DAILY 90 tablet 1   ascorbic acid  (VITAMIN C ) 500 MG tablet Take 1 tablet (500 mg total) by mouth daily. 30 tablet 0   aspirin  EC 81 MG tablet Take 1 tablet (81 mg total) by mouth daily. Swallow whole. 30 tablet 12   atorvastatin  (LIPITOR) 40 MG tablet Take 40 mg by mouth every evening.     Calcium  Carb-Cholecalciferol  (CALCIUM  600-D PO) Take 1 tablet by mouth 2 (two) times daily.     citalopram  (CELEXA ) 40 MG tablet TAKE 1 TABLET (40 MG TOTAL) BY MOUTH DAILY. 30  tablet 0   finasteride  (PROSCAR ) 5 MG tablet Take 5 mg by mouth daily.     magnesium  oxide (MAG-OX) 400 MG tablet Take 400 mg by mouth every evening.     Multiple Vitamins-Minerals (OCUVITE-LUTEIN PO) Take 2 capsules by mouth daily.     spironolactone  (ALDACTONE ) 25 MG tablet Take 0.5 tablets (12.5 mg total) by mouth daily. 45 tablet 3   vitamin B-12 (CYANOCOBALAMIN ) 500 MCG tablet Take 500 mcg by mouth every Sunday.     zolpidem  (AMBIEN ) 5 MG tablet Take 5 mg by mouth at bedtime.     amoxicillin  (AMOXIL ) 500 MG capsule Take four pills one hour prior to dental work (Patient not taking: Reported on 05/22/2023) 8 capsule 2   carvedilol  (COREG ) 6.25 MG tablet Take 1 tablet (6.25 mg total) by mouth 2 (two) times daily. 180 tablet 3   ENTRESTO  97-103 MG TAKE 1 TABLET BY MOUTH 2 TIMES A DAY 180 tablet 3   Potassium Chloride  ER 20 MEQ TBCR TAKE 1 TABLET BY MOUTH DAILY 90 tablet 3   No current facility-administered medications for this visit.    Social History   Socioeconomic History   Marital status: Married    Spouse name: Not on file   Number of children: 4   Years of education: 12   Highest education level: Not on file  Occupational History   Occupation: Retired  Tobacco Use   Smoking status: Former    Current packs/day: 0.00    Types: Cigarettes    Quit date: 2003    Years since quitting: 22.3   Smokeless tobacco: Former    Types: Snuff    Quit date: 2022   Tobacco comments:    Chews [place]  Vaping Use   Vaping status: Never Used  Substance and Sexual Activity   Alcohol use: No   Drug use: No   Sexual activity: Not on file  Other Topics Concern   Not on file  Social History Narrative   Fun: Fish, woodworking   Social Drivers of Corporate investment banker Strain: Not on file  Food Insecurity: Not on file  Transportation Needs: Not on file  Physical Activity: Not on file  Stress: Not on file  Social Connections: Not on file  Intimate Partner Violence: Not on file    Additional social history is notable that he is married for 44 years.  His 4 children, 9 grandchildren, and 2 great-grandchildren.  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  He  completed 12 grades years of education.  He had smoked for 30 years but quit tobacco in 2003.  There is no EtOH use.    Family History  Problem Relation Age of Onset   Heart disease Mother    Prostate cancer Father    Healthy Maternal Grandmother    Healthy Maternal Grandfather    Healthy Paternal Grandmother    Healthy Paternal Grandfather     ROS General: Negative; No fevers, chills, or night sweats HEENT: Negative; No changes in vision or hearing, sinus congestion, difficulty swallowing Pulmonary: Negative; No cough, wheezing, shortness of breath, hemoptysis Cardiovascular:  See HPI;  GI: Negative; No nausea, vomiting, diarrhea, or abdominal pain GU: Remote history of bladder tumor. Musculoskeletal: Status post left total hip arthroplasty, anterior approach August 30, 2021, Dr.Xu Hematologic/Oncologic: Negative; no easy bruising, bleeding Endocrine: Negative; no heat/cold intolerance; no diabetes Neuro: Negative; no changes in balance, headaches Skin: Negative; No rashes or skin lesions Psychiatric: Negative; No behavioral problems, depression Sleep:  Positive for sleep apnea on CPAP ; positive for difficulty with sleep initiation Other comprehensive 14 point system review is negative   Physical Exam BP 128/66 (BP Location: Left Arm, Patient Position: Sitting)   Pulse (!) 58   Ht 5\' 10"  (1.778 m)   Wt 208 lb 3.2 oz (94.4 kg)   SpO2 95%   BMI 29.87 kg/m   Repeat blood pressure by me was 110/64.  Wt Readings from Last 3 Encounters:  07/07/16 196 lb (88.9 kg)  01/05/16 202 lb (91.6 kg)  07/13/15 202 lb 6 oz (91.8 kg)   General: Alert, oriented, no distress.  Skin: normal turgor, no rashes, warm and dry HEENT: Normocephalic, atraumatic. Pupils equal round and  reactive to light; sclera anicteric; extraocular muscles intact;  Nose without nasal septal hypertrophy Mouth/Parynx benign; Mallinpatti scale 3 Neck: No JVD, no carotid bruits; normal carotid upstroke Lungs: clear to ausculatation and percussion; no wheezing or rales Chest wall: without tenderness to palpitation Heart: PMI not displaced, RRR, s1 s2 normal, 1/6 systolic murmur, no diastolic murmur, no rubs, gallops, thrills, or heaves Abdomen: soft, nontender; no hepatosplenomehaly, BS+; abdominal aorta nontender and not dilated by palpation. Back: no CVA tenderness Pulses 2+ Musculoskeletal: full range of motion, normal strength, no joint deformities Extremities: no clubbing cyanosis or edema, Homan's sign negative  Neurologic: grossly nonfocal; Cranial nerves grossly wnl Psychologic: Normal mood and affect  EKG Interpretation Date/Time:  Monday May 22 2023 08:16:34 EDT Ventricular Rate:  54 PR Interval:  214 QRS Duration:  174 QT Interval:  476 QTC Calculation: 451 R Axis:   -55  Text Interpretation: Sinus bradycardia with 1st degree A-V block Left axis deviation Non-specific intra-ventricular conduction block Possible Lateral infarct , age undetermined When compared with ECG of 07-Oct-2021 11:38, PREVIOUS ECG IS PRESENT Confirmed by Magnus Schuller ([id number]) on 05/24/2023 5:48:59 PM     May 16, 2022 ECG (independently read by me): Sinus bradycardia 54 bpm.  Borderline first-degree AV block; PR 206 ms.  Left bundle branch block.  February 22, 2021 ECG (independently read by me):  Sinus bradycardia at 55, LBBB  August 24, 2020 ECG (independently read by me): Sinus bradycardia 56 bpm, left axis deviation, left bundle branch block.  QTc interval 465 ms.  February 26, 2020 ECG (independently read by me): Sinus bradycardia at 57; LBBB, QRS  168 msec; QTc 467 msec  May 2022 ECG (independently read by me): Sinus bradycardia at 54 bpm, first-degree AV block, left bundle branch  block  September 2020 ECG (independently read by me): Bradycardia 57 bpm, first-degree degree AV block with PR interval 214 ms; left bundle branch block.  March 2020 ECG (independently read by me): Sinus bradycardia 51 bpm.  Left bundle branch block.  First-degree AV block with appeared in with 210 ms.  January 11, 2018 ECG (independently read by me): Sinus bradycardia 52 bpm.  First-degree AV block with appeared normal at 220 ms.  Left bundle branch block.  November 30, 2017 ECG (independently read by me): Sinus bradycardia 57 bpm.  Left bundle branch block with repolarization changes.  Right superior axis.  October 05, 2017 ECG (independently read by me): Sinus bradycardia 53 bpm.  First-degree AV block.  Left bundle branch block with repolarization changes.  July 2019 ECG (independently read by me): Sinus bradycardia 52 bpm with first-degree AV block.  Left bundle branch block.  June 2018 ECG (independently read by me): Sinus bradycardia 53 bpm with first-degree AV block.  PR interval 210 ms.  Left bundle branch block with repolarization changes.  Left axis deviation.  December 2017 ECG (independently read by me): sinus bradycardia 53 bpm with left bundle branch block.  First degree AV block.  December 2016 ECG (independently read by me):  Sinus bradycardia at 56 bpm with first-degree AV block with a PR interval at 216.  Left bundle branch block with repolarization changes.  10/14/2014 ECG (independently read by me): Sinus bradycardia 52 bpm per first-degree AV block with a PR interval of 212 ms.  Left bundle branch block with repolarization changes.  LABS:     Latest Ref Rng & Units 05/22/2023    9:09 AM 10/10/2021    3:01 AM 10/09/2021    8:45 AM  BMP  Glucose 70 - 99 mg/dL 086  578  469   BUN 8 - 27 mg/dL 18  18  9    Creatinine 0.76 - 1.27 mg/dL 6.29  5.28  4.13   BUN/Creat Ratio 10 - 24 19     Sodium 134 - 144 mmol/L 140  135  136   Potassium 3.5 - 5.2 mmol/L 4.4  4.0  3.3    Chloride 96 - 106 mmol/L 106  106  103   CO2 20 - 29 mmol/L 21  20  19    Calcium  8.6 - 10.2 mg/dL 9.0  8.9  8.6         Latest Ref Rng & Units 05/22/2023    9:09 AM 10/10/2021    3:01 AM 10/09/2021    8:45 AM  Hepatic Function  Total Protein 6.0 - 8.5 g/dL 6.4  6.2  6.0   Albumin 3.7 - 4.7 g/dL 4.2  3.1  2.5   AST 0 - 40 IU/L 18  32  31   ALT 0 - 44 IU/L 23  22  43   Alk Phosphatase 44 - 121 IU/L 116  84  58   Total Bilirubin 0.0 - 1.2 mg/dL 0.8  0.5  0.9        Latest Ref Rng & Units 05/22/2023    9:09 AM 10/10/2021    3:01 AM 10/09/2021    8:45 AM  CBC  WBC 3.4 - 10.8 x10E3/uL 7.6  3.2  11.3   Hemoglobin 13.0 - 17.7 g/dL 24.4  01.0  27.2   Hematocrit 37.5 - 51.0 % 40.5  34.3  36.9   Platelets 150 - 450 x10E3/uL 156  111  166    Lab Results  Component Value  Date   MCV 94 05/22/2023   MCV 88.9 10/10/2021   MCV 93.7 10/09/2021   Lab Results  Component Value Date   TSH 4.210 05/22/2023   Lab Results  Component Value Date   HGBA1C 5.8 (H) 06/22/2019     BNP    Component Value Date/Time   BNP 73.2 05/22/2023 0909   BNP 230.3 (H) 10/07/2021 1609    ProBNP    Component Value Date/Time   PROBNP 470 01/29/2018 1001     Lipid Panel     Component Value Date/Time   CHOL 96 06/22/2019 2334   TRIG 60 06/22/2019 2334   HDL 37 (L) 06/22/2019 2334   CHOLHDL 2.6 06/22/2019 2334   VLDL 12 06/22/2019 2334   LDLCALC 47 06/22/2019 2334    RADIOLOGY: No results found.  IMPRESSION:  1. Hypertensive heart disease without heart failure   2. HFrEF (heart failure with reduced ejection fraction) (HCC)   3. OSA on CPAP   4. Hyperlipidemia, unspecified hyperlipidemia type   5. Medication management       ASSESSMENT AND PLAN: Trevor Schmidt is an 83 year-old male (whose birthday is tomorrow) has a history of hypertension, CAD, chronic left bundle branch block, hyperlipidemia, obstructive sleep apnea on CPAP therapy, remote brain aneurysm with subsequent VP shunt for  hydrocephalus.  He was found to have reduced LV function with lateral dyssynergy most likely contributed by his underlying left bundle branch block.  A follow-up echo Doppler study in August 2018 showed further reduction in LV function  with an EF of 20 to 25%.  There was moderate LVH with grade 1 diastolic dysfunction.  He had mild AR, mild dilation of his ascending aorta, mitral annular calcification with mild Trevor and moderate left atrial enlargement.  He was started on Entresto  with titration up to maximum dose over several office visits.  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  He had moderate left atrial dilatation.  He has done well with guideline directed medical therapy and remains asymptomatic.  He is on Entresto  at maximum dose 97/103 mg twice a day, spironolactone  12.5 mg daily, carvedilol  6.25 mg twice a day, in addition to amlodipine  5 mg daily.  Blood pressure today is stable.  Presently, he is now on atorvastatin  40 mg for hyperlipidemia.  He takes finasteride  for prostate.  I am recommending he undergo follow-up comprehensive metabolic panel, BNP, CBC and TSH today.  He continues to use CPAP with excellent tolerance and benefit.  Compliance is excellent with 100% with average use at 10 hours and 8 minutes/day.  AHI is 2.7 with his pressure at a range of 9 to 18 cm (95th percentile average 16 and maximum average 17.3 cm of water.  ECG today remains stable with sinus bradycardia and first-degree AV block and nonspecific intraventricular block.  I have recommended he undergo a follow-up echo Doppler study in August/September and in 6 months in light of my upcoming retirement I will transition him to see Dr. Alvis Ba for reestablishment of cardiology care. He continues to see Dr. Schuyler Custard for primary care.    Millicent Ally, MD, [facility], ABSM Diplomate, American Board of Sleep  Medicine  05/24/2023 5:47 PM

## 2023-05-22 NOTE — Patient Instructions (Signed)
 Medication Instructions:  NO CHANGES *If you need a refill on your cardiac medications before your next appointment, please call your pharmacy*  Lab Work: CMP,BNP,TSH, CBC TODAY If you have labs (blood work) drawn today and your tests are completely normal, you will receive your results only by: MyChart Message (if you have MyChart) OR A paper copy in the mail If you have any lab test that is abnormal or we need to change your treatment, we will call you to review the results.  Testing/Procedures: Your physician has requested that you have an echocardiogram. Echocardiography is a painless test that uses sound waves to create images of your heart. It provides your doctor with information about the size and shape of your heart and how well your heart's chambers and valves are working. This procedure takes approximately one hour. There are no restrictions for this procedure. Please do NOT wear cologne, perfume, aftershave, or lotions (deodorant is allowed). Please arrive 15 minutes prior to your appointment time.  Please note: We ask at that you not bring children with you during ultrasound (echo/ vascular) testing. Due to room size and safety concerns, children are not allowed in the ultrasound rooms during exams. Our front office staff cannot provide observation of children in our lobby area while testing is being conducted. An adult accompanying a patient to their appointment will only be allowed in the ultrasound room at the discretion of the ultrasound technician under special circumstances. We apologize for any inconvenience.   Follow-Up: At [facility], you and your health needs are our priority.  As part of our continuing mission to provide you with exceptional heart care, our providers are all part of one team.  This team includes your primary Cardiologist (physician) and Advanced Practice Providers or APPs (Physician Assistants and Nurse Practitioners) who all work together to  provide you with the care you need, when you need it.  Your next appointment:   6 month(s)  Provider:   Luana Rumple, MD  Other Instructions   1st Floor: - Lobby - Registration  - Pharmacy  - Lab - Cafe  2nd Floor: - PV Lab - Diagnostic Testing (echo, CT, nuclear med)  3rd Floor: - Vacant  4th Floor: - TCTS (cardiothoracic surgery) - AFib Clinic - Structural Heart Clinic - Vascular Surgery  - Vascular Ultrasound  5th Floor: - [facility] (general and EP) - Clinical Pharmacy for coumadin, hypertension, lipid, weight-loss medications, and med management appointments    Valet parking services will be available as well.

## 2023-05-23 ENCOUNTER — Other Ambulatory Visit: Payer: Self-pay

## 2023-05-23 LAB — COMPREHENSIVE METABOLIC PANEL WITH GFR
ALT: 23 IU/L (ref 0–44)
AST: 18 IU/L (ref 0–40)
Albumin: 4.2 g/dL (ref 3.7–4.7)
Alkaline Phosphatase: 116 IU/L (ref 44–121)
BUN/Creatinine Ratio: 19 (ref 10–24)
BUN: 18 mg/dL (ref 8–27)
Bilirubin Total: 0.8 mg/dL (ref 0.0–1.2)
CO2: 21 mmol/L (ref 20–29)
Calcium: 9 mg/dL (ref 8.6–10.2)
Chloride: 106 mmol/L (ref 96–106)
Creatinine, Ser: 0.93 mg/dL (ref 0.76–1.27)
Globulin, Total: 2.2 g/dL (ref 1.5–4.5)
Glucose: 106 mg/dL — ABNORMAL HIGH (ref 70–99)
Potassium: 4.4 mmol/L (ref 3.5–5.2)
Sodium: 140 mmol/L (ref 134–144)
Total Protein: 6.4 g/dL (ref 6.0–8.5)
eGFR: 82 mL/min/{1.73_m2} (ref >59)

## 2023-05-23 LAB — CBC
Hematocrit: 40.5 % (ref 37.5–51.0)
Hemoglobin: 13.4 g/dL (ref 13.0–17.7)
MCH: 30.9 pg (ref 26.6–33.0)
MCHC: 33.1 g/dL (ref 31.5–35.7)
MCV: 94 fL (ref 79–97)
Platelets: 156 10*3/uL (ref 150–450)
RBC: 4.33 x10E6/uL (ref 4.14–5.80)
RDW: 12.3 % (ref 11.6–15.4)
WBC: 7.6 10*3/uL (ref 3.4–10.8)

## 2023-05-23 LAB — BRAIN NATRIURETIC PEPTIDE: BNP: 73.2 pg/mL (ref 0.0–100.0)

## 2023-05-23 LAB — TSH: TSH: 4.21 u[IU]/mL (ref 0.450–4.500)

## 2023-05-23 MED ORDER — CARVEDILOL 6.25 MG PO TABS: 6.2500 mg | ORAL_TABLET | Freq: Two times a day (BID) | ORAL | 3 refills | Status: AC

## 2023-05-24 ENCOUNTER — Encounter: Payer: Self-pay | Admitting: Cardiovascular Disease

## 2023-06-01 DIAGNOSIS — J302 Other seasonal allergic rhinitis: Secondary | ICD-10-CM | POA: Diagnosis not present

## 2023-06-01 DIAGNOSIS — R051 Acute cough: Secondary | ICD-10-CM | POA: Diagnosis not present

## 2023-06-27 ENCOUNTER — Ambulatory Visit (HOSPITAL_COMMUNITY)
Admission: RE | Admit: 2023-06-27 | Discharge: 2023-06-27 | Disposition: A | Discharge status: Home / Self Care | Source: Ambulatory Visit | Attending: Cardiology | Admitting: Cardiology

## 2023-06-27 ENCOUNTER — Ambulatory Visit: Payer: Self-pay | Admitting: Cardiovascular Disease

## 2023-06-27 DIAGNOSIS — I5020 Unspecified systolic (congestive) heart failure: Secondary | ICD-10-CM | POA: Diagnosis not present

## 2023-06-27 LAB — ECHOCARDIOGRAM COMPLETE
Area-P 1/2: 4.19 cm2
P 1/2 time: 915 ms
S' Lateral: 4.2 cm

## 2023-07-27 ENCOUNTER — Other Ambulatory Visit: Payer: Self-pay | Admitting: Cardiovascular Disease

## 2023-08-30 ENCOUNTER — Ambulatory Visit: Payer: PPO | Admitting: Orthopaedic Surgery

## 2023-08-30 ENCOUNTER — Other Ambulatory Visit (INDEPENDENT_AMBULATORY_CARE_PROVIDER_SITE_OTHER): Payer: Self-pay

## 2023-08-30 DIAGNOSIS — Z96642 Presence of left artificial hip joint: Secondary | ICD-10-CM

## 2023-08-30 NOTE — Progress Notes (Signed)
 Patient: Trevor Schmidt           Date of Birth: 01/01/1941           MRN: [id number] Visit Date: 08/30/2023 PCP: Clarice Nottingham, MD   Assessment & Plan:  Chief Complaint:  Chief Complaint  Patient presents with   Left Hip - Follow-up   Visit Diagnoses:  1. S/P total left hip arthroplasty     Plan: History of Present Illness Trust L Carl Butner is an 83 year old male who presents for a two-year post-operative check following hip replacement surgery.  He experiences no pain or discomfort in the hip and has no limitations in activities such as walking and mowing the yard. The hip does not restrict any activities.  Results RADIOLOGY Hip X-ray: Prosthesis well positioned  Assessment and Plan Status post left total hip replacement Two years post-op, no pain or mobility issues. X-rays show well-positioned implant and healed scar. - Discharged from routine follow-up care. - Advised to return if concerns arise.  Follow-Up Instructions: Return if symptoms worsen or fail to improve.   Orders:  Orders Placed This Encounter  Procedures   XR HIP UNILAT W OR W/O PELVIS 2-3 VIEWS LEFT   No orders of the defined types were placed in this encounter.   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/09/2021   Degenerative joint disease (DJD) of hip 07/01/2021   Chest pain 06/22/2019   Cardiomyopathy, ischemic  07/13/2015   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   CAD in native artery    Hyperlipidemia LDL goal <70 11/28/2014   OSA on CPAP 11/28/2014   Hyperlipidemia 10/16/2014   LBBB (left bundle branch block) 10/16/2014   Mitral valve prolapse 10/07/2014   Essential hypertension 10/07/2014   BPH (benign prostatic hyperplasia) 10/07/2014   Past Medical History:  Diagnosis Date   Acute bronchitis 02/04/2015   Arthritis    Cancer (HCC)    bladder   Confusion    Depression    Dysrhythmia    LBBB   Edema    feet/ankles   GERD (gastroesophageal reflux disease)    HOH (hard of hearing)    aids   Hypertension    Kidney stones    MVP (mitral valve prolapse)    Prostate disease    Sleep apnea    CPAP    Family History  Problem Relation Age of Onset   Heart disease Mother    Prostate cancer Father    Healthy Maternal Grandmother    Healthy Maternal Grandfather    Healthy Paternal Grandmother    Healthy Paternal Grandfather     Past Surgical History:  Procedure Laterality Date   BACK SURGERY     BLADDER SURGERY     cancer   CARDIAC CATHETERIZATION N/A 12/09/2014   Procedure: Left Heart Cath and Coronary Angiography;  Surgeon: Debby DELENA Sor, MD;  Location: [facility] INVASIVE CV [facility];  Service: Cardiovascular;  Laterality:  N/A;   CATARACT EXTRACTION W/PHACO Right 09/08/2014   Procedure: CATARACT EXTRACTION PHACO AND INTRAOCULAR LENS PLACEMENT (IOC);  Surgeon: Steven Dingeldein, MD;  Location: [facility] ORS;  Service: Ophthalmology;  Laterality: Right;  US :1:06.3 AP%:23.3 CDE:24.97 Fluid lot# [id number]   CEREBRAL ANEURYSM REPAIR  2001   coil and shunt placement   CYSTOSCOPY     litho x 2   KNEE ARTHROSCOPY Right    TONSILLECTOMY     tonsils and adenoids removed as a child   TOTAL HIP ARTHROPLASTY Left 08/30/2021   Procedure: LEFT TOTAL HIP ARTHROPLASTY ANTERIOR APPROACH;  Surgeon: Jerri Kay HERO, MD;  Location: [facility] OR;  Service: Orthopedics;  Laterality: Left;  3-C    VENTRICULOPERITONEAL SHUNT  2001   WRIST SURGERY Bilateral    ctr   Social History   Occupational History   Occupation: Retired  Tobacco Use   Smoking status: Former    Current packs/day: 0.00    Types: Cigarettes    Quit date: 2003    Years since quitting: 22.5   Smokeless tobacco: Former    Types: Snuff    Quit date: 2022   Tobacco comments:    Chews [place]  Vaping Use   Vaping status: Never Used  Substance and Sexual Activity   Alcohol use: No   Drug use: No   Sexual activity: Not on file

## 2023-11-06 ENCOUNTER — Other Ambulatory Visit: Payer: Self-pay

## 2023-11-07 MED ORDER — AMLODIPINE BESYLATE 5 MG PO TABS: 5.0000 mg | ORAL_TABLET | Freq: Every day | ORAL | 1 refills | Status: AC

## 2023-11-14 ENCOUNTER — Telehealth: Payer: Self-pay | Admitting: Orthopaedic Surgery

## 2023-11-14 NOTE — Telephone Encounter (Signed)
 Patient called and needs proof that he don't need any medication for dental procedure. [phone]  Can you fax to [phone]

## 2023-11-14 NOTE — Telephone Encounter (Signed)
 Faxed to number provided. Notified patient's wife.

## 2023-12-04 ENCOUNTER — Encounter: Payer: Self-pay | Admitting: Radiology

## 2023-12-20 DIAGNOSIS — E785 Hyperlipidemia, unspecified: Secondary | ICD-10-CM | POA: Diagnosis not present

## 2023-12-20 LAB — LAB REPORT - SCANNED
Albumin, Urine POC: 7.3
Albumin/Creatinine Ratio, Urine, POC: 3
Creatinine, POC: 209.1 mg/dL
EGFR: 75

## 2023-12-25 DIAGNOSIS — Z0000 Encounter for general adult medical examination without abnormal findings: Secondary | ICD-10-CM | POA: Diagnosis not present

## 2023-12-25 DIAGNOSIS — K219 Gastro-esophageal reflux disease without esophagitis: Secondary | ICD-10-CM | POA: Diagnosis not present

## 2023-12-25 DIAGNOSIS — E7800 Pure hypercholesterolemia, unspecified: Secondary | ICD-10-CM | POA: Diagnosis not present

## 2023-12-25 DIAGNOSIS — I2510 Atherosclerotic heart disease of native coronary artery without angina pectoris: Secondary | ICD-10-CM | POA: Diagnosis not present

## 2023-12-25 DIAGNOSIS — K5909 Other constipation: Secondary | ICD-10-CM | POA: Diagnosis not present

## 2023-12-25 DIAGNOSIS — I447 Left bundle-branch block, unspecified: Secondary | ICD-10-CM | POA: Diagnosis not present

## 2023-12-25 DIAGNOSIS — E538 Deficiency of other specified B group vitamins: Secondary | ICD-10-CM | POA: Diagnosis not present

## 2023-12-25 DIAGNOSIS — F339 Major depressive disorder, recurrent, unspecified: Secondary | ICD-10-CM | POA: Diagnosis not present

## 2023-12-25 DIAGNOSIS — E785 Hyperlipidemia, unspecified: Secondary | ICD-10-CM | POA: Diagnosis not present

## 2023-12-25 DIAGNOSIS — I5042 Chronic combined systolic (congestive) and diastolic (congestive) heart failure: Secondary | ICD-10-CM | POA: Diagnosis not present

## 2023-12-25 DIAGNOSIS — I10 Essential (primary) hypertension: Secondary | ICD-10-CM | POA: Diagnosis not present

## 2023-12-26 ENCOUNTER — Other Ambulatory Visit: Payer: Self-pay | Admitting: Internal Medicine

## 2023-12-26 DIAGNOSIS — R413 Other amnesia: Secondary | ICD-10-CM

## 2024-02-09 ENCOUNTER — Ambulatory Visit: Attending: Cardiovascular Disease | Admitting: Cardiovascular Disease

## 2024-02-09 ENCOUNTER — Encounter: Payer: Self-pay | Admitting: Cardiovascular Disease

## 2024-02-09 VITALS — BP 118/60 | HR 60 | Ht 70.0 in | Wt 208.0 lb

## 2024-02-09 DIAGNOSIS — I5022 Chronic systolic (congestive) heart failure: Secondary | ICD-10-CM

## 2024-02-09 DIAGNOSIS — E785 Hyperlipidemia, unspecified: Secondary | ICD-10-CM | POA: Diagnosis not present

## 2024-02-09 DIAGNOSIS — I10 Essential (primary) hypertension: Secondary | ICD-10-CM

## 2024-02-09 DIAGNOSIS — I2510 Atherosclerotic heart disease of native coronary artery without angina pectoris: Secondary | ICD-10-CM | POA: Diagnosis not present

## 2024-02-09 DIAGNOSIS — I440 Atrioventricular block, first degree: Secondary | ICD-10-CM

## 2024-02-09 DIAGNOSIS — I447 Left bundle-branch block, unspecified: Secondary | ICD-10-CM | POA: Diagnosis not present

## 2024-02-09 DIAGNOSIS — G4733 Obstructive sleep apnea (adult) (pediatric): Secondary | ICD-10-CM | POA: Diagnosis not present

## 2024-02-09 NOTE — Progress Notes (Unsigned)
 " Cardiology Office Note   Date:  02/10/2024  ID:  Trevor Schmidt, DOB 1940-06-30, MRN [id number] PCP: Clarice Nottingham, MD  [facility] Providers Cardiologist:  Jerel Balding, MD     History of Present Illness Trevor Schmidt is a 84 y.o. 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  He is here with his wife Trevor Schmidt, as they are both transitioning cardiology care after Dr. Joesphine retirement.  Prior to that he saw Dr. Charlena Gallop.  He feels well.  He walks 7/10 of a mile every day during the winter (roughly 15 to 20-minute duration) and if the weather is good he will add another 7/10 of the mile later that day.  The patient specifically denies any chest pain at rest or with exertion, dyspnea at rest or with exertion, orthopnea, paroxysmal nocturnal dyspnea, syncope, palpitations, focal neurological deficits, intermittent claudication, lower extremity edema, unexplained weight gain, cough, hemoptysis or wheezing.  He has not had falls or bleeding problems.  He is compliant with CPAP and denies daytime hypersomnolence.  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  He has not had angina pectoris and his only current antianginal is carvedilol .  He has nonischemic cardiomyopathy out of proportion to the coronary problems and the reduction in LV function is at least in part related to LBBB related dyssynchrony.  Assessment of left ventricular ejection fraction has waxed and waned over the years, being as low as 25-30%, most recently estimated at 35-40% on echocardiogram from 06/27/2023.  He is on good medical therapy for cardiomyopathy including maximum dose Entresto ,  carvedilol , spironolactone .  He is not currently taking an SGLT2 inhibitor and is not requiring loop diuretics.  He has not had symptoms of coronary disease and has not required a repeat angiogram since 2016.  Metabolic control is very good.  On atorvastatin  his most recent LDL cholesterol is 47, HDL 40 and triglycerides were normal.  A remote hemoglobin A1c from 2021 placed him in prediabetes range at 5.8% but I do not have a more recent assessment of the A1c, although he is not fasting glucose was mildly elevated at 106.  Most recent creatinine 2025 was normal at 1.0 and his potassium was 4.4.  His MMSE score has declined from 27 (2 years ago) to 22 last December.  Studies Reviewed    Personally reviewed his most recent ECG from 05/22/2023 which shows sinus rhythm, first-degree AV block and left bundle branch block. The QRS complex is quite broad at 174 ms. Echocardiogram 06/27/2023 Cardiac catheterization 12/09/2014 Notes/labs from PCP 01/10/2024  Risk Assessment/Calculations         Physical Exam VS:  BP 118/60 (BP Location: Left Arm, Patient Position: Sitting)   Pulse 60   Ht 5' 10 (1.778 m)   Wt 208 lb (94.3 kg)   SpO2 96%   BMI 29.84 kg/m        Wt Readings from Last 3 Encounters:  02/09/24 208 lb (94.3 kg)  05/22/23 208 lb 3.2 oz (94.4 kg)  05/16/22 206 lb (93.4 kg)    GEN: Well nourished, well developed in no acute distress NECK: No JVD; No carotid bruits CARDIAC: Normal S1 and paradoxically split S2, RRR, faint systolic ejection murmur no diastolic murmurs,  rubs, gallops RESPIRATORY:  Clear to auscultation without rales, wheezing or rhonchi  ABDOMEN: Soft, non-tender, non-distended EXTREMITIES:  No edema; No deformity   ASSESSMENT AND PLAN Chronic HFrEF: Due to primarily nonischemic cardiomyopathy.  Very well compensated.  NYHA functional class I, euvolemic without loop diuretics or SGLT2 inhibitors.  On maximum dose of Entresto  and also on carvedilol  and.  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  He does not have angina pectoris despite being physically active.  The revascularization is not indicated.  He is on aspirin , lipid-lowering medications and beta-blockers. LBBB: Very broad QRS.  This explains at least part of his reduction in LV function.  He would be a good candidate for CRT-P, if this were to become necessary due to worsening heart failure and lower ejection fraction.  Avoid higher doses of beta-blockers due to risk of progression of conduction abnormalities; also has first-degree AV block. HTN: Well-controlled, continue same meds. HLP: Borderline low HDL, but excellent LDL cholesterol.  Continue atorvastatin  OSA: Plan for CPAP, denies hypersomnolence.  Will make sure he is referred to Dr. Dorine sleep clinic.       Dispo: Follow up 6 mos  Signed, Jerel Balding, MD    "

## 2024-02-09 NOTE — Patient Instructions (Signed)
 Medication Instructions:   No changes  *If you need a refill on your cardiac medications before your next appointment, please call your pharmacy*   Lab Work: Not needed    Testing/Procedures:  Not needed  Follow-Up: At [facility], you and your health needs are our priority.  As part of our continuing mission to provide you with exceptional heart care, we have created designated Provider Care Teams.  These Care Teams include your primary Cardiologist (physician) and Advanced Practice Providers (APPs -  Physician Assistants and Nurse Practitioners) who all work together to provide you with the care you need, when you need it.     Your next appointment:   6 month(s)  The format for your next appointment:   In Person  Provider:   Jerel Balding, MD

## 2024-02-10 DIAGNOSIS — I440 Atrioventricular block, first degree: Secondary | ICD-10-CM | POA: Insufficient documentation

## 2024-02-10 DIAGNOSIS — I5022 Chronic systolic (congestive) heart failure: Secondary | ICD-10-CM | POA: Insufficient documentation

## 2024-05-21 ENCOUNTER — Ambulatory Visit: Admitting: Diagnostic Neuroimaging
